# Patient Record
Sex: Male | Born: 1955 | Race: Black or African American | Hispanic: No | Smoking: Light tobacco smoker
Health system: Southern US, Community
[De-identification: ages and names within clinical notes are randomized; demographics above are authoritative.]

## PROBLEM LIST (undated history)

## (undated) DIAGNOSIS — I10 Essential (primary) hypertension: Secondary | ICD-10-CM

## (undated) DIAGNOSIS — IMO0002 Reserved for concepts with insufficient information to code with codable children: Secondary | ICD-10-CM

## (undated) DIAGNOSIS — I219 Acute myocardial infarction, unspecified: Secondary | ICD-10-CM

## (undated) DIAGNOSIS — E119 Type 2 diabetes mellitus without complications: Secondary | ICD-10-CM

## (undated) DIAGNOSIS — I739 Peripheral vascular disease, unspecified: Secondary | ICD-10-CM

## (undated) DIAGNOSIS — Z9289 Personal history of other medical treatment: Secondary | ICD-10-CM

## (undated) DIAGNOSIS — E785 Hyperlipidemia, unspecified: Secondary | ICD-10-CM

## (undated) DIAGNOSIS — I2510 Atherosclerotic heart disease of native coronary artery without angina pectoris: Secondary | ICD-10-CM

## (undated) DIAGNOSIS — D649 Anemia, unspecified: Secondary | ICD-10-CM

## (undated) DIAGNOSIS — T7840XA Allergy, unspecified, initial encounter: Secondary | ICD-10-CM

## (undated) DIAGNOSIS — Z8371 Family history of colonic polyps: Secondary | ICD-10-CM

## (undated) DIAGNOSIS — Z83719 Family history of colon polyps, unspecified: Secondary | ICD-10-CM

## (undated) DIAGNOSIS — F419 Anxiety disorder, unspecified: Secondary | ICD-10-CM

## (undated) DIAGNOSIS — R9430 Abnormal result of cardiovascular function study, unspecified: Secondary | ICD-10-CM

## (undated) DIAGNOSIS — Z720 Tobacco use: Secondary | ICD-10-CM

## (undated) HISTORY — DX: Essential (primary) hypertension: I10

## (undated) HISTORY — DX: Peripheral vascular disease, unspecified: I73.9

## (undated) HISTORY — DX: Acute myocardial infarction, unspecified: I21.9

## (undated) HISTORY — DX: Allergy, unspecified, initial encounter: T78.40XA

## (undated) HISTORY — DX: Abnormal result of cardiovascular function study, unspecified: R94.30

## (undated) HISTORY — DX: Tobacco use: Z72.0

## (undated) HISTORY — DX: Atherosclerotic heart disease of native coronary artery without angina pectoris: I25.10

## (undated) HISTORY — DX: Family history of colon polyps, unspecified: Z83.719

## (undated) HISTORY — DX: Hyperlipidemia, unspecified: E78.5

## (undated) HISTORY — DX: Anxiety disorder, unspecified: F41.9

## (undated) HISTORY — DX: Family history of colonic polyps: Z83.71

## (undated) HISTORY — DX: Reserved for concepts with insufficient information to code with codable children: IMO0002

## (undated) HISTORY — PX: CORONARY ANGIOPLASTY WITH STENT PLACEMENT: SHX49

---

## 1998-05-31 ENCOUNTER — Emergency Department (HOSPITAL_COMMUNITY): Admission: EM | Admit: 1998-05-31 | Discharge: 1998-05-31 | Payer: Self-pay | Admitting: Emergency Medicine

## 2006-02-24 ENCOUNTER — Inpatient Hospital Stay (HOSPITAL_COMMUNITY): Admission: AD | Admit: 2006-02-24 | Discharge: 2006-02-25 | Payer: Self-pay | Admitting: Cardiology

## 2006-02-24 ENCOUNTER — Ambulatory Visit: Payer: Self-pay | Admitting: Cardiology

## 2006-03-11 ENCOUNTER — Ambulatory Visit: Payer: Self-pay | Admitting: Cardiology

## 2006-05-17 ENCOUNTER — Ambulatory Visit: Payer: Self-pay | Admitting: Cardiology

## 2007-02-03 ENCOUNTER — Ambulatory Visit: Payer: Self-pay | Admitting: Cardiology

## 2007-05-11 ENCOUNTER — Ambulatory Visit: Payer: Self-pay | Admitting: Cardiology

## 2008-12-13 ENCOUNTER — Encounter: Payer: Self-pay | Admitting: Cardiology

## 2009-05-26 ENCOUNTER — Encounter: Payer: Self-pay | Admitting: Cardiology

## 2009-05-28 ENCOUNTER — Ambulatory Visit: Payer: Self-pay | Admitting: Cardiology

## 2009-05-28 ENCOUNTER — Encounter: Payer: Self-pay | Admitting: Internal Medicine

## 2010-05-12 ENCOUNTER — Encounter: Payer: Self-pay | Admitting: Cardiology

## 2010-07-29 ENCOUNTER — Encounter (INDEPENDENT_AMBULATORY_CARE_PROVIDER_SITE_OTHER): Payer: Self-pay | Admitting: *Deleted

## 2010-09-30 NOTE — Miscellaneous (Signed)
  Clinical Lists Changes  Observations: Added new observation of PAST MED HX: EF.. 40%....catheterization.... 2007   ( no echo data as of 05/12/2010) CAD...bare metal stent..LAD( 02/24/2006)...total RCA.. right to right collaterals.Marland Kitchen dyslipidemia * TOBACCO USE * HYPERTENSION CHEST DISCOMFORT Finances... very limited per patient   (05/12/2010 17:15) Added new observation of PRIMARY MD: None (05/12/2010 17:15)       Past History:  Past Medical History: EF.. 40%....catheterization.... 2007   ( no echo data as of 05/12/2010) CAD...bare metal stent..LAD( 02/24/2006)...total RCA.. right to right collaterals.Marland Kitchen dyslipidemia * TOBACCO USE * HYPERTENSION CHEST DISCOMFORT Finances... very limited per patient This

## 2010-09-30 NOTE — Letter (Signed)
Summary: Appointment - Missed  [facility], Main Office  [address]   [place], [place] [address]   Phone: [phone]  Fax: [phone]     July 29, 2010 MRN: [id number]   Austin Espinoza [address] [place], [place]  [address]   Dear [name],  Our records indicate you missed your appointment on  05/15/10  with Dr. Myrtis Ser . It is very important that we reach you to reschedule this appointment. We look forward to participating in your health care needs. Please contact us at the number listed above at your earliest convenience to reschedule this appointment.     Sincerely,  Christen Butter Scheduling Team

## 2011-01-05 ENCOUNTER — Other Ambulatory Visit: Payer: Self-pay | Admitting: Cardiology

## 2011-01-13 NOTE — Assessment & Plan Note (Signed)
[facility]                            CARDIOLOGY OFFICE NOTE   Espinoza, Austin                      MRN:          [id number]  DATE:02/03/2007                            DOB:          [birth year]    HISTORY OF PRESENT ILLNESS:  Austin Espinoza is a 55 year old gentleman who  presented with unstable angina in June of 2007.  I placed a bare metal  stent in his LAD.  He also has a total occlusion of the mid right  coronary which is collateralized from the proximal right.  My plan is to  manage that conservatively.  His subsequent course has been  uncomplicated.  He has not had any chest discomfort or exertional  dyspnea.  He does some yard work without symptoms.   Unfortunately, his medical compliance is questionable.  When I asked him  if he takes his medicines every day he says yes.  When I ask him about  his blood pressure he says he did not take it today.  When I pressed  further, it becomes clear that he has not taken it in several days.  I,  thus, do not have much confidence that he is taking them frequently at  all.  I reiterated to him the importance of compliance with these  medications for the reduction in his long term risk.   CURRENT MEDICATIONS:  1. Lovastatin 10 mg daily.  2. Atenolol 25 mg daily.  3. Enteric-coated aspirin 325 mg daily.  4. Enalapril 10 mg daily.   PHYSICAL EXAMINATION:  He is generally well appearing in no distress.  Heart rate is 64, blood pressure 162/88, weight of 185 pounds.  Weight  is down 11 pounds from September.  He has no jugular venous distension or thyromegaly.  Lungs are clear to auscultation.  Respiratory effort is normal.  He has a nondisplaced point of maximal cardiac impulse.  There is  regular rate and rhythm, normal S1 and S2.  There is an S4, but no  murmur or S3.  The abdomen is soft, nondistended, nontender.  There is no  hepatosplenomegaly and no abdominal bruit or pulsatile abdominal  mass.  Carotid pulses are 2+ bilaterally without bruit.   Electrocardiogram today demonstrates normal sinus rhythm at 64 beats per  minute with left ventricular hypertrophy without significant  repolarization abnormality.  No significant change compared with prior.   IMPRESSION/RECOMMENDATIONS:  1. Coronary disease, ejection fraction 35%-40%.  Continue medical      therapy.  I reiterated the critical importance of compliance.  2. Left ventricular systolic dysfunction.  Continue angiotensin-      converting enzyme inhibitor and beta blocker.  3. Hypercholesterolemia.  Continue lovastatin.  Since he has not been      compliant with it, there is no point in checking lipids or liver      function tests today.  We will bring him back in 3 months for      recheck.  4. Tobacco abuse.  Continues to smoke.  I strongly advised him to quit      entirely.  5. Hypertension.  Blood pressure elevated.  I cannot get much of a      read on his blood pressure because he has not taken his medicines      in several days.  I have asked him to resume, and we will have him      back in 3 months' time.     Salvadore Farber, MD  Electronically Signed    WED/MedQ  DD: 02/03/2007  DT: 02/03/2007  Job #: [id number]

## 2011-01-13 NOTE — Assessment & Plan Note (Signed)
[facility]                            CARDIOLOGY OFFICE NOTE   Austin Espinoza, Austin Espinoza                      MRN:          [id number]  DATE:05/11/2007                            DOB:          1956-04-11    Austin Espinoza has known coronary disease.  He is here for followup.  He  had been followed by Dr. Samule Ohm of our group previously.  Dr. Samule Ohm has  moved to [facility], and I will be taking over Austin Espinoza cardiology  care.  Patient has known coronary disease.  He presented with unstable  angina in June, 2007.  He received a bare metal stent to his LAD.  He  had total occlusion of the right that was collateralized from the  proximal right.  He has been managed conservatively.  He has done well.  He is not having chest pain.  He is not having any significant chest  pain.  There has been no syncope or presyncope.   PAST MEDICAL HISTORY:  Other medical problems, see the list below.   ALLERGIES:  There are no documented drug allergies.   MEDICATIONS:  1. Lovastatin 10.  2. Ranitidine on a p.r.n. basis.  3. Aspirin 325.  4. Enalapril 10 mg daily.   REVIEW OF SYSTEMS:  The patient mentioned that he has a slight twitching  sensation in various parts of his body at different times.  This does  not seem to be a major issue. Otherwise, his review of systems is  negative.   PHYSICAL EXAMINATION:  Weight is 187 pounds.  Blood pressure 140/88 with  a pulse of 62.  Patient is oriented to person, time, and place, and he is here with a  family member.  LUNGS:  Clear.  Respiratory effort is not labored.  CARDIAC:  An S1 with an S2.  There are no clicks or significant murmurs.  He has no xanthelasma. There is normal extraocular motion.  There are no carotid bruits.  There is no jugular venous distention.  ABDOMEN:  Soft.  He has normal bowel sounds.  He has no masses or  bruits.  EXTREMITIES:  He has no peripheral edema.   No labs are done today.   PROBLEMS:  1. Coronary disease.  He has a bare metal stent in his left anterior      descending artery.  He has a total right coronary with right-to-      right collateral.  2. History of ejection fraction in the 40% range.  We will keep him on      beta blocker and an ACE inhibitor.  His finances are quite limited,      and his compliance has been a difficult issue.  We will not push      for further aggressive evaluation, as he is getting the appropriate      medications, and he is stable.  3. Hypercholesterolemia:  We will see if we can change him to      inexpensive simvastatin to get more effect.  4. Tobacco use:  The patient continues to smoke.  I have talked  with      him about this and have urged him to try to stop.  He acknowledges      that he needs to, but he never does.  5. Hypertension:  His blood pressure is controlled.   Cardiac status is stable.  Follow up in one year.     Austin Abed, MD, [facility]  Electronically Signed    JDK/MedQ  DD: 05/11/2007  DT: 05/12/2007  Job #: [id number]

## 2011-01-16 NOTE — H&P (Signed)
NAME:  Austin Espinoza, ATTEBERRY NO.:  [id number]   MEDICAL RECORD NO.:  [id number]          PATIENT TYPE:  INP   LOCATION:  2905                         FACILITY:  [facility]   PHYSICIAN:  Amo Bing, M.D. [facility] OF BIRTH:  [birth year]   DATE OF ADMISSION:  02/24/2006  DATE OF DISCHARGE:                                HISTORY & PHYSICAL   PRIMARY CARE PHYSICIAN:  Dr. Steva Ready in the past.   HISTORY OF PRESENT ILLNESS:  This is a 55 year old gentleman with an  accelerating pattern of resting burning mid anterior chest discomfort  radiating to the left arm.  [name] noted the onset of these symptoms  approximately 2-3 weeks ago occurring at a rate of once per day.  There is  no relationship to the time of day, meals or exertion.  There was no  associated dyspnea, diaphoresis, nor nausea.  A few days prior to admission,  he had a brief episode with moderate exertion.  On the evening of admission,  symptoms recurred and were more persistent than they had previously, lasting  for 20 minutes.  He was seen in the emergency department at [facility] and advised to transfer to [facility].  After initial  treatment with intravenous heparin and nitroglycerin, no further symptoms  have occurred.   PAST MEDICAL HISTORY:  Unremarkable.  [name] has not been seen by a  physician for the past 10 years.  He is unaware of any cardiovascular risk  factors.  He takes no medicines routinely.  His only prior medical  intervention has been a left carpal tunnel release.   SOCIAL HISTORY:  He lives in [place] with his brother; divorced; no  children; works as a Arts administrator; 10 pack year history of cigarette  smoking that continues 1/2 pack per day.  No excessive use of alcohol.   FAMILY HISTORY:  Mother died at age 40 due to diabetes; father died in his  63's with hypertension and heart problems.  He has 15 siblings, only 1 of  whom has had coronary disease.   REVIEW  OF SYSTEMS:  Review of systems is notable for recent mild dyspnea on  exertion.  All other systems reviewed and are negative.   PHYSICAL EXAMINATION:  GENERAL:  He is pleasant, well-appearing  proportionate gentleman.  VITAL SIGNS:  The heart rate is 65, respirations 15, blood pressure 150/95,  O2 saturation 100% on 2 liters.  Afebrile.  HEENT:  Anicteric sclerae; EOMs full; normal oral mucosa.  NECK:  No jugulovenous distension; normal carotid upstrokes without bruits.  ENDOCRINE:  No thyromegaly.  HEMATOPOIETIC:  No adenopathy.  SKIN:  No significant lesions.  LUNGS:  Mild expiratory rhonchi; moderate prolongation of the expiratory  phase.  CARDIAC:  Normal first and second heart sounds; fourth heart sound present.  ABDOMEN:  Firm; nontender; no organomegaly; normal bowel sounds.  EXTREMITIES:  No edema; normal distal pulses.  MUSCULOSKELETAL: No joint deformities.  NEUROMUSCULAR:  Symmetric strength and tone; normal cranial nerves.   RADIOGRAPHIC DATA:  CHEST X-RAY:  Reportedly NAD.   EKG:  Normal sinus rhythm;  LVH by voltage; left atrial abnormality; marked  ST-T-wave abnormalities consistent with anterolateral ischemia or MI.   LABORATORY DATA:  There are laboratories notable for a normal CBC, normal  chemistry profile, borderline glucose  of132, BNP of 165, borderline initial  cardiac markers.   IMPRESSION:  [name] presents with symptoms consistent with unstable  angina and marked EKG abnormalities.  The likelihood is that he is  experiencing ischemia in the distribution of the left anterior descending  coronary artery.  Heparin, aspirin and intravenous nitroglycerin will be  continued.  Metoprolol will be added at low-dose.  Antiplatelet therapy will  be added per protocol.  We will proceed with coronary angiography.  The  risks and benefits have been explained to [name] who agrees to proceed.   There is EKG evidence for left ventricular hypertrophy with  marginally  elevated blood pressure.  It is likely the patient has hypertension.  This  will be managed as appropriate.   He has borderline hyperglycemia.  A lipid profile will be obtained.  Presence of  metabolic syndrome is likely and will be treated appropriately.   He has been advised to discontinue use of tobacco  products.  We will assist  him with this as necessary.      [place] Bing, M.D. [facility]  Electronically Signed     RR/MEDQ  D:  02/24/2006  T:  02/24/2006  Job:  [id number]

## 2011-01-16 NOTE — Cardiovascular Report (Signed)
NAME:  Austin Espinoza, Austin Espinoza NO.:  [id number]   MEDICAL RECORD NO.:  [id number]          PATIENT TYPE:  INP   LOCATION:  2905                         FACILITY:  [facility]   PHYSICIAN:  Charlton Haws, M.D.     DATE OF BIRTH:  11-Apr-1956   DATE OF PROCEDURE:  DATE OF DISCHARGE:                              CARDIAC CATHETERIZATION   Coronary arteriography.   INDICATIONS:  New onset angina.   Cine catheterization done from the right femoral artery.  The patient was on  IV nitro and heparin prior to the start of the case.  The nitro was titrated  due to his blood pressure.   1.  The left main coronary artery had 20% distal lesion.  2.  Left anterior descending artery had an 80% stenosis in the mid vessel      just after the takeoff of the first diagonal branch.  The first diagonal      branch had a 70% tubular lesion.  The distal LAD was normal.  3.  Circumflex coronary artery was codominant.  4.  The first obtuse marginal branch had a 50% tubular lesion.  5.  The right coronary artery had a 90% eccentric lesion proximally.  It was      100% occluded after the first RV branch.  There were some left-to-right      collaterals to the codominant PDA through septal perforators.   VENTRICULOGRAPHY:  Ventriculography showed global hypokinesis, worse at the  apex.  EF was only 35-40%.   Aortic pressure was 180/98.  LV pressure was 171/13.   IMPRESSION:  The films were reviewed with Dr. Samule Ohm.  I suspect he will  proceed with stenting of the native LAD and treat the remainder of the  vessels medically.   The patient has not seen a doctor in 10 years.  He needs better control his  blood pressure.   We will go over his meds, but I suspect he will be on both Coreg and an ACE  inhibitor.           ______________________________  Charlton Haws, M.D.     PN/MEDQ  D:  02/24/2006  T:  02/24/2006  Job:  [id number]   cc:   [place] Bing, M.D. [facility]  [address]  [address]  [place]  [place] [address]

## 2011-01-16 NOTE — Discharge Summary (Signed)
NAME:  Austin Espinoza, Austin Espinoza NO.:  [id number]   MEDICAL RECORD NO.:  [id number]          PATIENT TYPE:  INP   LOCATION:  2905                         FACILITY:  [facility]   PHYSICIAN:  Salvadore Farber, M.D. LHCDATE OF BIRTH:  08-05-1956   DATE OF ADMISSION:  02/24/2006  DATE OF DISCHARGE:                                 DISCHARGE SUMMARY   PROCEDURES:  1.  Cardiac catheterization.  2.  Coronary arteriogram.  3.  Left ventriculogram.  4.  Percutaneous transluminal coronary angiography with a bare metal stent      to one vessel.   TIME AT DISCHARGE:  37 minutes.   PRIMARY DIAGNOSIS:  Acute coronary syndrome status post bare metal stent to  the left anterior descending.   SECONDARY DIAGNOSES:  1.  Residual coronary artery disease with the right coronary artery totaled      and left-to-right collaterals, first diagonal 70%, circumflex 50%,      medical therapy.  2.  Ischemic cardiomyopathy with an ejection fraction of 35-40%.  3.  Hyperlipidemia with a total cholesterol 186, triglyceride 80, HDL 47,      LDL 123.  4.  Hypertension.  5.  Tobacco use.  6.  Family history of coronary artery disease in his father and one sibling.  7.  No known drug allergies.   HOSPITAL COURSE:  Mr. Gerads is a 55 year old male with no previous history  of coronary artery disease.  He has not seen a doctor in ten years.  He was  smoking.  He had no medical problems of which he was aware.  He was admitted  for further evaluation and treatment.   It was decided that cardiac catheterization was the best way to define his  anatomy.  The cardiac catheterization showed a 20% left main, 80% mid LAD,  and a first diagonal 70% stenosis.  The circumflex was co-dominant and had a  50% stenosis in the OM.  The RCA had a 90% proximal stenosis and was totaled  in the mid portion with left-to-right collaterals to the codominant PDA.  He  had global hypokinesis on his left ventriculogram that was  worse at the apex  with an EF of 35-40%.  Dr. Samule Ohm and Dr. Eden Emms reviewed the films and felt  that percutaneous intervention was indicated.   Mr. Eliot had PTCA and a bare metal stent reducing the LAD stenosis from  95 to 0.  He is to be on Plavix for one year and aspirin indefinitely.  Medical therapy for his other coronary artery disease is recommended.   Dictation ends here.      Theodore Demark, P.A. [facility]      Salvadore Farber, M.D. [facility]  Electronically Signed    RB/MEDQ  D:  02/25/2006  T:  02/25/2006  Job:  [id number]

## 2011-01-16 NOTE — Assessment & Plan Note (Signed)
[facility] HEALTHC[facility]                              CARDIOLOGY OFFICE NOTE   AEDON, DEASON                      MRN:          [id number]  DATE:05/17/2006                            DOB:          May 28, 1956    HISTORY OF PRESENT ILLNESS:  [name] is a 55 year old gentleman who  presented with unstable angina in June.  I placed a bare metal stent in his  mid LAD.  He also has a total occlusion at the mid right coronary, which is  collateralized from the right.  We are managing that conservatively.  Post  procedural course has been uncomplicated.  We filled out his indigent papers  for the Plavix program, but he says he has never managed to return the  papers to the company.  Receiving other medicines from [facility].   CURRENT MEDICATIONS:  1. Lovastatin 10 mg per day.  2. Atenolol 25 mg per day.  3. Enalapril 5 mg per day.  4. Enteric-coated aspirin 325 mg per day.   PHYSICAL EXAMINATION:  GENERAL:  He is generally well-appearing in no  distress.  VITAL SIGNS:  Heart rate of 57, blood pressure 138/82, and weight of 196  pounds.  NECK:  He has no jugular venous distension or thyromegaly.  LUNGS:  Clear to auscultation.  CARDIAC:  He has a nondisplaced point of maximum cardiac impulse.  There is  a regular rate and rhythm with normal S1 and S2.  There is an S4.  There is  no murmur.  ABDOMEN:  Soft, nondistended, and nontender.  There is no hepatosplenomegaly  and no abdominal bruit or pulsatile abdominal mass.  Femoral pulses 2 plus  bilaterally without bruit.  PT pulses 2+ bilaterally.   Electrocardiogram demonstrates sinus bradycardia at 57 beats per minute with  LVH and repolarization abnormality.  No change when compared with prior.   IMPRESSION/RECOMMENDATIONS:  1. Coronary artery disease:  Ejection fraction 35-40%.  Continue medical      therapy, including aspirin, beta-blocker, ACE inhibitor.  Will stop the      Plavix as he is through  the highest risk period without being on it.      Bare metal stent in the LAD.  2. Left ventricular systolic dysfunction:  Continue aspirin, Plavix, and      beta-blocker.  3. Hypercholesterolemia:  Continue lovastatin.  Check lipids and LFTs in 4      weeks.  4. Tobacco abuse:  Continues to smoke.  Says he plans to quit.  5. Hypertension:  Blood pressure higher than I would like.  Will double      enalapril to 10 mg a day.                                 Salvadore Farber, MD    WED/MedQ  DD:  05/17/2006  DT:  05/17/2006  Job #:  [id number]

## 2011-01-16 NOTE — Assessment & Plan Note (Signed)
[facility] HEALTHCARE                              CARDIOLOGY OFFICE NOTE   Austin Espinoza, Austin Espinoza                      MRN:          [id number]  DATE:03/11/2006                            DOB:          [birth year]    CARDIOLOGY NOTE:  This is a 55 year old African-American male patient who is  here being seen post hospital.  He presented with a two to three week  history of chest pain.  Cardiac enzymes were initially negative and he  underwent PTCA and bare metal stent of the mid LAD with residual 5% distal  left main, 50% OM1, 90% eccentric lesion, proximal lesion of the RCA and  total occlusion of the RCA after the first RV branch.  There was some left  to right collaterals to the codominant PDA through the septal perforators.  He had global hypokinesis, worse at the apex, ejection fraction 35 to 40%.  His troponins were slightly elevated post procedure at 0.24 but CK's, MB's  were negative.   Since the patient has been home he denies any chest pain, palpitations or  shortness of breath.  He is having trouble affording the Plavix.  He was  referred to the indigent program.  They have claimed that they sent him a  form to complete about his income and he says he never got it.  He took his  last pill yesterday and his sister said he cannot afford this.  His other  drugs are supplied at [facility] for four dollars.  He is also asking if he  can go back to work.   CURRENT MEDICATIONS:  1.  Lovastatin 10 mg daily.  2.  Atenolol 25 mg daily.  3.  Enalapril 5 mg daily.  4.  Ranitidine p.r.n.  5.  Coated aspirin 325 mg daily.  6.  Plavix 75 mg daily.   PHYSICAL EXAMINATION:  This is a pleasant 55 year old African-American male  in no acute distress.  VITAL SIGNS:  Blood pressure 146/80.  Pulse 52.  Weight 186.  NECK:  Without JVD, HJR, bruit or thyroid enlargement.  LUNGS:  Clear anterior, posterior and lateral.  HEART:  Regular rate and rhythm at 52 beats per  minute.  Normal S1 and S2.  Positive S4.  No murmur heard.  ABDOMEN:  Soft with no organomegaly, no masses, lesions or abnormal  tenderness.  Right groin is without hematoma or hemorrhage. Good distal  pulses.  EKG:  Sinus bradycardia with T wave inversion V5 through V6.  No acute  changes. No old EKGs to compare.  He has inferior Q waves and LVH .   IMPRESSION:  1.  Status post bare metal stent to the left anterior descending, on February 24, 2006 with residual 90% and total right coronary artery  global      hyperkinesis ejection fraction 35 to 40%.  2.  Hypertension.  3.  Hypolipidemia.  4.  Family history of coronary artery disease.  5.  Tobacco abuse.  Says he quit.  6.  History of left carpal tunnel surgery.   PLAN:  Plan at this time.  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  We are trying to work out the Plavix  indigent program so he does receive his Plavix as Dr. Samule Ohm wants him to  stay on this for a year.  He will see Dr. Samule Ohm back in followup in one  month.                                  Jacolyn Reedy, PA-C    ML/MedQ  DD:  03/11/2006  DT:  03/11/2006  Job #:  [id number]

## 2011-01-16 NOTE — Cardiovascular Report (Signed)
NAME:  NAT, LOWENTHAL NO.:  [id number]   MEDICAL RECORD NO.:  [id number]          PATIENT TYPE:  INP   LOCATION:  2905                         FACILITY:  [facility]   PHYSICIAN:  Salvadore Farber, M.D. LHCDATE OF BIRTH:  02-Aug-1956   DATE OF PROCEDURE:  02/24/2006  DATE OF DISCHARGE:                              CARDIAC CATHETERIZATION   PROCEDURE:  Placement of bare-metal stent in the mid-left anterior  descending artery, intravascular ultrasound of the LAD.   INDICATIONS:  Mr. Fieldhouse is a 55 year old gentleman who has received  minimal medical care in the past.  He presents with non-ST elevation  myocardial infarction.  He underwent diagnostic angiography by Dr. Eden Emms  earlier today.  That demonstrated chronic total occlusion of the RCA which  supplies a fairly small territory as it is codominant.  The culprit lesion  was a 95% stenosis of the mid-LAD.  There is also a 90% stenosis in the  midportion of a moderate-sized diagonal.  I was asked to consider  percutaneous revascularization.  After discussion with the patient, we  decided to proceed with stenting of the LAD and conservative management of  the diagonal and RCA.   PROCEDURAL TECHNIQUE:  Informed consent was obtained.  Under 1% lidocaine  local anesthesia, the preexisting right common femoral arterial sheath was  upsized over a wire to 6-French.  Heparin and double bolus eptifibatide were  administered as was CHAMPION study drug.  ACT was maintained at greater than  200 seconds.  A 6-French CLS 3.5 guide was advanced over wire and engaged in  the ostial of the left main.  A Prowater wire was advanced to the distal  vessel without difficulty.  The lesion was predilated using a 3.0 x 9 mm  Maverick at 6 atmospheres.  I then performed intravascular ultrasound to  assess both the length of the lesion and the diameter of the vessel.  This  demonstrated the vessel to measure 4.1 x 3.2 mm and the thrombus to  extend  within 2 mm of the diagonal.  We then proceeded to stenting using a 3.5 x 12  mm Liberte.  I positioned the stent to begin just distal to the origin of  the diagonal.  I deployed it at 16 atmospheres.  We then postdilated using a  3.75 x 12 mm Quantum at 16 atmospheres.  Repeat intravascular ultrasound  demonstrated complete apposition and very good expansion of the stent.  As  intended, the stent begins just after the origin of the diagonal.   Final angiography demonstrated no residual stenosis, no dissection and TIMI  III flow to the distal vasculature.  The patient was then transferred to the  holding room in stable condition having tolerated the procedure well.   COMPLICATIONS:  None.   IMPRESSION/PLAN:  Successful bare-metal stent placed in the mid-left  anterior descending artery .  Due to his acute coronary syndrome, he should  be maintained on Plavix for a year.  Aspirin should be continued  indefinitely.  Statin was added and will be continued.      Salvadore Farber, M.D. [facility]  Electronically Signed     WED/MEDQ  D:  02/24/2006  T:  02/24/2006  Job:  [id number]

## 2011-01-16 NOTE — Discharge Summary (Signed)
NAME:  Austin Espinoza, Austin Espinoza NO.:  [id number]   MEDICAL RECORD NO.:  [id number]          PATIENT TYPE:  INP   LOCATION:  2905                         FACILITY:  [facility]   PHYSICIAN:  Salvadore Farber, M.D. LHCDATE OF BIRTH:  17-Dec-1955   DATE OF ADMISSION:  02/24/2006  DATE OF DISCHARGE:  02/25/2006                                 DISCHARGE SUMMARY   TIME OF DISCHARGE:  37 minutes.   PROCEDURES:  1.  Cardiac catheterization.  2.  Coronary arteriogram.  3.  Left ventriculogram.  4.  Percutaneous transluminal coronary angiography and bare metal stent to      one vessel.   PRIMARY DIAGNOSIS:  Chest pain, acute coronary syndrome.   SECONDARY DIAGNOSIS:  1.  Hyperlipidemia.  2.  Hypertension.  3.  Family history of coronary artery disease in his father.  4.  History of left carpal tunnel surgery.  5.  No known drug allergies.  6.  Ongoing tobacco use.Marland Kitchen   HOSPITAL COURSE:  [name] is a 55 year old male with no previous history  of coronary artery disease.  He had a 2-3-week history of chest pain and  came to the hospital where he was admitted for further evaluation and  treatment.   Cardiac enzymes were negative but a cardiac catheterization was performed to  further define his anatomy.  It showed an 80% LAD in the mid portion as well  as a 70% diagonal, 50% circumflex, 90% RCA that was totaled in the mid  portion with left-to-right collaterals,  and the left-to-right collaterals  filled the PDA and was codominant with the circumflex.  His EF was 35-40%.  [name] had PTCA and a bare metal stent reducing the stenosis to 0.  His  post procedure enzymes were minimally elevated with a troponin of 0.24 but  the CK MBs were all negative.  A lipid profile was performed which showed a  total cholesterol 186, triglycerides 80, HDL 47, LDL 123.  [name] had a  low dose beta blocker and ACE inhibitor added to his medication regimen for  ischemic cardiomyopathy.   He had a case manager consult to help him obtain  his medications including Plavix.  He is to follow up with the [facility]  in [place] for general medical care.  [name] was seen by cardiac rehab  and given information on a walking program as well as stent restrictions and  a low-fat diet.  On February 25, 2006, [name] was evaluated by Dr. Samule Espinoza  and considered stable for discharge with outpatient follow-up arranged.   DISCHARGE INSTRUCTIONS:  His activity level is to be increased gradually.  He is to stick to a low fat diet.  He has an appointment with Dr. Melinda Crutch  PA on July 12 at 9:15.   DISCHARGE MEDICATIONS:  1.  Lovastatin 10 mg daily.  2.  Atenolol 25 mg a day.  3.  Enalapril 5 mg daily.  4.  Nitroglycerin sublingual p.r.n.  5.  Ranitidine 3 mg a day.  6.  Coated aspirin 325 mg a day.  7.  Plavix 75  mg daily.   [name] medications were changed to prescriptions that could be filled  at [facility] for $4 with the exception of the Plavix.      Austin Espinoza, P.A. [facility]      Salvadore Farber, M.D. [facility]  Electronically Signed    RB/MEDQ  D:  02/25/2006  T:  02/25/2006  Job:  [id number]

## 2011-02-18 ENCOUNTER — Other Ambulatory Visit: Payer: Self-pay | Admitting: Cardiology

## 2011-12-20 ENCOUNTER — Encounter: Payer: Self-pay | Admitting: Cardiology

## 2011-12-20 DIAGNOSIS — I2510 Atherosclerotic heart disease of native coronary artery without angina pectoris: Secondary | ICD-10-CM | POA: Insufficient documentation

## 2011-12-20 DIAGNOSIS — R9430 Abnormal result of cardiovascular function study, unspecified: Secondary | ICD-10-CM | POA: Insufficient documentation

## 2011-12-20 DIAGNOSIS — Z720 Tobacco use: Secondary | ICD-10-CM | POA: Insufficient documentation

## 2011-12-20 DIAGNOSIS — E785 Hyperlipidemia, unspecified: Secondary | ICD-10-CM | POA: Insufficient documentation

## 2011-12-20 DIAGNOSIS — I10 Essential (primary) hypertension: Secondary | ICD-10-CM | POA: Insufficient documentation

## 2011-12-21 ENCOUNTER — Encounter: Payer: Self-pay | Admitting: *Deleted

## 2011-12-23 ENCOUNTER — Encounter: Payer: Self-pay | Admitting: Cardiology

## 2011-12-23 ENCOUNTER — Ambulatory Visit (INDEPENDENT_AMBULATORY_CARE_PROVIDER_SITE_OTHER): Payer: Self-pay | Admitting: Cardiology

## 2011-12-23 VITALS — BP 149/80 | HR 61 | Ht 68.0 in | Wt 187.0 lb

## 2011-12-23 DIAGNOSIS — Z720 Tobacco use: Secondary | ICD-10-CM

## 2011-12-23 DIAGNOSIS — F17200 Nicotine dependence, unspecified, uncomplicated: Secondary | ICD-10-CM

## 2011-12-23 DIAGNOSIS — I2510 Atherosclerotic heart disease of native coronary artery without angina pectoris: Secondary | ICD-10-CM

## 2011-12-23 DIAGNOSIS — I739 Peripheral vascular disease, unspecified: Secondary | ICD-10-CM

## 2011-12-23 NOTE — Progress Notes (Signed)
   HPI Patient is seen for followup coronary disease and to assess leg discomfort. He has known coronary disease. I saw him last in September, 2010. He received a bare-metal stent to the LAD in 2007. He had a total right with right to right collaterals. Ejection fraction in the past was in the 45% range. At this time he is not having any chest pain or shortness of breath. However he has discomfort in his legs with walking. He seems to have significant claudication.  As part of today's evaluation I have reviewed carefully the records from 2010. I have updated completely the new electronic medical record.  No Known Allergies  Current Outpatient Prescriptions  Medication Sig Dispense Refill  . aspirin 325 MG tablet Take 325 mg by mouth daily.      Marland Kitchen atenolol (TENORMIN) 25 MG tablet TAKE ONE TABLET BY MOUTH EVERY DAY  30 tablet  12  . enalapril (VASOTEC) 10 MG tablet TAKE ONE TABLET BY MOUTH EVERY DAY  30 tablet  12  . lovastatin (MEVACOR) 10 MG tablet TAKE ONE TABLET BY MOUTH EVERY DAY - NEEDS APPOINTMENT.  30 tablet  12    History   Social History  . Marital Status: Widowed    Spouse Name: N/A    Number of Children: 0  . Years of Education: N/A   Occupational History  . Not on file.   Social History Main Topics  . Smoking status: Current Everyday Smoker  . Smokeless tobacco: Not on file  . Alcohol Use: Not on file  . Drug Use: Not on file  . Sexually Active: Not on file   Other Topics Concern  . Not on file   Social History Narrative  . No narrative on file    Family History  Problem Relation Age of Onset  . Hypertension    . Heart disease    . Diabetes    . Coronary artery disease      Past Medical History  Diagnosis Date  . CAD (coronary artery disease)     Bare-metal stent, LAD, 2007, total RCA, right to right collaterals  . Ejection fraction < 50%     EF 40% in the past  . Dyslipidemia   . Tobacco abuse   . Hypertension     No past surgical history on  file.  ROS   Patient denies fever, chills, headache, sweats, rash, change in vision, change in hearing, chest pain, cough, nausea vomiting, urinary symptoms. All other systems are reviewed and are negative.  PHYSICAL EXAM  Patient is stable. He is oriented to person time and place. Affect is normal. There is no jugulovenous distention. There no carotid bruits. Lungs are clear. Respiratory effort is nonlabored. Cardiac exam reveals S1 and S2. There are no clicks or significant murmurs. The abdomen is soft. His distal pulses are absent by physical exam. There is no coolness of his feet.  Filed Vitals:   12/23/11 1617  BP: 149/80  Pulse: 61  Height: 5\' 8"  (1.727 m)  Weight: 187 lb (84.823 kg)   EKG is done and reviewed by me. There is increased voltage. The tracing is compatible with left internal hypertrophy with voltage and ST changes. There is no significant change from the past.  ASSESSMENT & PLAN

## 2011-12-23 NOTE — Assessment & Plan Note (Signed)
The patient is having symptoms with walking. He may well have significant bilateral claudication. He has decreased distal pulses. He does not have any rest pain. We'll arrange for arterial Dopplers of the legs and then see him in followup. We'll be in touch with him about the information. If he has marked abnormalities, I will refer him for vascular evaluation before seeing him back.

## 2011-12-23 NOTE — Patient Instructions (Signed)
Your physician recommends that you schedule a follow-up appointment in: 6 weeks  Your physician has requested that you have a lower or upper extremity arterial duplex. This test is an ultrasound of the arteries in the legs or arms. It looks at arterial blood flow in the legs and arms. Allow one hour for Lower and Upper Arterial scans. There are no restrictions or special instructions

## 2011-12-23 NOTE — Assessment & Plan Note (Signed)
Coronary disease is stable. He has not been assessed since 2007 area however he's not having symptoms. I will delay workup of his cardiac status until after the workup of his claudication.

## 2011-12-23 NOTE — Assessment & Plan Note (Signed)
The patient continues to smoke. He is counseled to stop.

## 2011-12-29 ENCOUNTER — Encounter (INDEPENDENT_AMBULATORY_CARE_PROVIDER_SITE_OTHER): Payer: Self-pay

## 2011-12-29 ENCOUNTER — Encounter: Payer: Self-pay | Admitting: Cardiology

## 2011-12-29 DIAGNOSIS — I739 Peripheral vascular disease, unspecified: Secondary | ICD-10-CM

## 2011-12-29 DIAGNOSIS — I70219 Atherosclerosis of native arteries of extremities with intermittent claudication, unspecified extremity: Secondary | ICD-10-CM

## 2012-01-01 ENCOUNTER — Encounter: Payer: Self-pay | Admitting: Cardiology

## 2012-01-01 ENCOUNTER — Telehealth: Payer: Self-pay

## 2012-01-01 DIAGNOSIS — I739 Peripheral vascular disease, unspecified: Secondary | ICD-10-CM

## 2012-01-01 NOTE — Telephone Encounter (Signed)
Message from Dr Myrtis Ser is as follows: "Please be in touch with Austin Espinoza and let him know that the Dopplers or abnormal. This probably does explain the difficulty she is having with his legs. I want him to be seen for peripheral vascular consultation. Please arrange this".  Austin Espinoza was given this information and will be awaiting an appt.

## 2012-01-01 NOTE — Progress Notes (Signed)
   After seeing the patient and the office recently, arterial leg Dopplers were done. The patient has significant disease. I will arrange for a PV consult.

## 2012-01-19 ENCOUNTER — Encounter: Payer: Self-pay | Admitting: Cardiovascular Disease

## 2012-01-19 ENCOUNTER — Ambulatory Visit (INDEPENDENT_AMBULATORY_CARE_PROVIDER_SITE_OTHER): Payer: Self-pay | Admitting: Cardiovascular Disease

## 2012-01-19 VITALS — BP 162/90 | HR 80 | Ht 68.0 in | Wt 189.0 lb

## 2012-01-19 DIAGNOSIS — I739 Peripheral vascular disease, unspecified: Secondary | ICD-10-CM

## 2012-01-19 DIAGNOSIS — I779 Disorder of arteries and arterioles, unspecified: Secondary | ICD-10-CM

## 2012-01-19 NOTE — Progress Notes (Signed)
   History of Present Illness: 56 yo male with history of CAD, HLD, tobacco abuse, HTN and recent c/o claudication who is here today as a new PV consult. Non-invasive studies 12/29/11 with ABI moderately reduced on the right and left (right ABI 0.58, left ABI 0.69). There is suggestion of inflow disease but also a focal stenosis in the left CFA and mid left SFA.   He tells me that his legs get "tired" with walking and soreness in the calf muscles with walking. He can only walk about 100 yards before he has the onset of leg pain. He has no rest pain or ulcers. He smokes 1/2 ppd. No chest pains or SOB. Works at [employer] in [employer].   Primary Care Physician:  None  Primary Cardiologist: Dr. Myrtis Ser  Past Medical History  Diagnosis Date  . CAD (coronary artery disease)     Bare-metal stent, LAD, 2007, total RCA, right to right collaterals  . Ejection fraction < 50%     EF 40% in the past  . Dyslipidemia   . Tobacco abuse   . Hypertension   . Claudication     April, 2013    Past Surgical History  Procedure Date  . None     Current Outpatient Prescriptions  Medication Sig Dispense Refill  . aspirin 325 MG tablet Take 325 mg by mouth daily.      Marland Kitchen atenolol (TENORMIN) 25 MG tablet TAKE ONE TABLET BY MOUTH EVERY DAY  30 tablet  12  . enalapril (VASOTEC) 10 MG tablet TAKE ONE TABLET BY MOUTH EVERY DAY  30 tablet  12  . lovastatin (MEVACOR) 10 MG tablet TAKE ONE TABLET BY MOUTH EVERY DAY - NEEDS APPOINTMENT.  30 tablet  12    No Known Allergies  History   Social History  . Marital Status: Widowed    Spouse Name: N/A    Number of Children: 0  . Years of Education: N/A   Occupational History  . Works at [employer]    Social History Main Topics  . Smoking status: Current Everyday Smoker -- 0.5 packs/day for 40 years    Types: Cigarettes  . Smokeless tobacco: Not on file  . Alcohol Use: No  . Drug Use: No  . Sexually Active: Not on file   Other Topics Concern  . Not on  file   Social History Narrative  . No narrative on file    Family History  Problem Relation Age of Onset  . Heart attack Father   . Diabetes Mother   . Cancer Brother     Review of Systems:  As stated in the HPI and otherwise negative.   BP 162/90  Pulse 80  Ht 5\' 8"  (1.727 m)  Wt 189 lb (85.73 kg)  BMI 28.74 kg/m2  Physical Examination: General: Well developed, well nourished, NAD HEENT: OP clear, mucus membranes moist SKIN: warm, dry. No rashes. Neuro: No focal deficits Musculoskeletal: Muscle strength 5/5 all ext Psychiatric: Mood and affect normal Neck: No JVD, no carotid bruits, no thyromegaly, no lymphadenopathy. Lungs:Clear bilaterally, no wheezes, rhonci, crackles Cardiovascular: Regular rate and rhythm. No murmurs, gallops or rubs. Abdomen:Soft. Bowel sounds present. Non-tender.  Extremities: No lower extremity edema. Pulses are trace  in the bilateral DP/PT.

## 2012-01-19 NOTE — Assessment & Plan Note (Signed)
He likely has disease in his iliac arteries with at least moderate disease in the left SFA. I have discussed arranging a distal aortogram with bilateral LE runoff  or CTA abdomen/pelvis with bilateral LE runoff to define his disease. I would favor the CTA first since he may have involvement of his aorta. Will not arrange at this time since he has no insurance. He works at [employer] and will check about his benefits today and let us know. His disease is stable at this time with no rest pain or ulcerations. I think it is safe to wait for him to have his insurance issues resolved so he is not left with a big medical bill. Continue ASA. No changes in therapy. Follow up will be arranged after further discussions with pt.

## 2012-01-19 NOTE — Patient Instructions (Signed)
Call us to let us know if you want to arrange test.  Call us if you have problems with your legs

## 2012-02-09 ENCOUNTER — Ambulatory Visit: Payer: Self-pay | Admitting: Cardiology

## 2012-02-10 ENCOUNTER — Institutional Professional Consult (permissible substitution): Payer: Self-pay | Admitting: Cardiovascular Disease

## 2012-02-10 ENCOUNTER — Ambulatory Visit: Payer: Self-pay | Admitting: Cardiology

## 2012-02-12 ENCOUNTER — Encounter: Payer: Self-pay | Admitting: Cardiology

## 2012-02-12 DIAGNOSIS — I739 Peripheral vascular disease, unspecified: Secondary | ICD-10-CM | POA: Insufficient documentation

## 2012-02-15 ENCOUNTER — Ambulatory Visit (INDEPENDENT_AMBULATORY_CARE_PROVIDER_SITE_OTHER): Payer: BC Managed Care – PPO | Admitting: Cardiology

## 2012-02-15 ENCOUNTER — Encounter: Payer: Self-pay | Admitting: Cardiology

## 2012-02-15 VITALS — BP 154/82 | HR 78 | Resp 18 | Ht 68.0 in | Wt 190.0 lb

## 2012-02-15 DIAGNOSIS — I739 Peripheral vascular disease, unspecified: Secondary | ICD-10-CM

## 2012-02-15 DIAGNOSIS — I2510 Atherosclerotic heart disease of native coronary artery without angina pectoris: Secondary | ICD-10-CM

## 2012-02-15 NOTE — Patient Instructions (Addendum)
Your physician recommends that you schedule a follow-up appointment in: 6 months  Dr Gibson Ramp nurse will be calling you to schedule a CTA.  If you do not hear from her, please call her, Dennie Bible), at [phone].

## 2012-02-15 NOTE — Progress Notes (Signed)
   HPI Patient is seen back for followup coronary disease and claudication. I saw him on December 23, 2011. His Dopplers revealed that he had significant peripheral disease and he was referred for vascular evaluation. He was seen by Dr. Clifton James on Jan 19, 2012. It was recommended that he have a CTA to assess him further. At that time the patient wanted to look further into his insurance status. It is my understanding that he now has the appropriate insurance through his employer.  The patient has had some increasing symptoms in his legs.   No Known Allergies  Current Outpatient Prescriptions  Medication Sig Dispense Refill  . aspirin 325 MG tablet Take 325 mg by mouth daily.      Marland Kitchen atenolol (TENORMIN) 25 MG tablet TAKE ONE TABLET BY MOUTH EVERY DAY  30 tablet  12  . enalapril (VASOTEC) 10 MG tablet TAKE ONE TABLET BY MOUTH EVERY DAY  30 tablet  12  . lovastatin (MEVACOR) 10 MG tablet TAKE ONE TABLET BY MOUTH EVERY DAY - NEEDS APPOINTMENT.  30 tablet  12    History   Social History  . Marital Status: Widowed    Spouse Name: N/A    Number of Children: 0  . Years of Education: N/A   Occupational History  . Works at [employer]    Social History Main Topics  . Smoking status: Current Everyday Smoker -- 0.5 packs/day for 40 years    Types: Cigarettes  . Smokeless tobacco: Not on file  . Alcohol Use: No  . Drug Use: No  . Sexually Active: Not on file   Other Topics Concern  . Not on file   Social History Narrative  . No narrative on file    Family History  Problem Relation Age of Onset  . Heart attack Father   . Diabetes Mother   . Cancer Brother     Past Medical History  Diagnosis Date  . CAD (coronary artery disease)     Bare-metal stent, LAD, 2007, total RCA, right to right collaterals  . Ejection fraction < 50%     EF 40% in the past  . Dyslipidemia   . Tobacco abuse   . Hypertension   . PAD (peripheral artery disease)     Arterial leg Doppler, December 29, 2011,  greater than 50% focal stenosis of the left mid S.FA, right ABI mild range left ABI moderate range  //   consultation by Dr. Clifton James, Jan 19, 2012 CTA recommended, patient considering and reviewing his finances.    Past Surgical History  Procedure Date  . None     ROS  Patient denies fever, chills, headache, sweats, rash, change in vision, change in hearing, chest pain, cough, nausea vomiting, urinary symptoms. All other systems are reviewed and are negative.  PHYSICAL EXAM Patient is oriented to person time and place. Affect is normal. There is no jugulovenous distention. Lungs are clear. Respiratory effort is nonlabored. Cardiac exam reveals S1 and S2. There are no clicks or significant murmurs. The abdomen is soft. Is no peripheral edema.  Filed Vitals:   02/15/12 0940  BP: 154/82  Pulse: 78  Resp: 18  Height: 5\' 8"  (1.727 m)  Weight: 190 lb (86.183 kg)     ASSESSMENT & PLAN

## 2012-02-15 NOTE — Assessment & Plan Note (Signed)
The patient has significant disease. I will try to help move forward his peripheral vascular workup. Please refer to Dr.McAlhany's consult note. I will be in touch with his nurse to see if we can arrange things for early followup soon. This would include the CTA.

## 2012-02-15 NOTE — Assessment & Plan Note (Signed)
Coronary disease is stable. No further workup at this time. He has not been evaluated fully since 2007. His main symptom currently is from his claudication.

## 2012-02-17 ENCOUNTER — Telehealth: Payer: Self-pay | Admitting: *Deleted

## 2012-02-17 DIAGNOSIS — I739 Peripheral vascular disease, unspecified: Secondary | ICD-10-CM

## 2012-02-17 DIAGNOSIS — I2510 Atherosclerotic heart disease of native coronary artery without angina pectoris: Secondary | ICD-10-CM

## 2012-02-17 NOTE — Telephone Encounter (Signed)
Per Dr. Clifton James pt needs CTA of his distal aorta with bilateral lower ext runoff.  I called pt to give him this information. Left message to call back.  Will need BMP prior to CTA

## 2012-02-18 NOTE — Telephone Encounter (Signed)
Spoke with pt. He agreed to CTA and is aware he will need BMET prior to CTA. I notify [facility] to call pt to schedule BMET and CTA.

## 2012-02-18 NOTE — Telephone Encounter (Signed)
Pt rtn call, pls call [phone]

## 2012-02-19 NOTE — Telephone Encounter (Signed)
Per CT dept pt does not need BMP prior to CT. Pt is aware.

## 2012-02-19 NOTE — Addendum Note (Signed)
Addended by: Dossie Arbour on: 02/19/2012 02:22 PM   Modules accepted: Orders

## 2012-02-24 ENCOUNTER — Other Ambulatory Visit: Payer: Self-pay | Admitting: Cardiology

## 2012-02-24 ENCOUNTER — Other Ambulatory Visit: Payer: BC Managed Care – PPO

## 2012-02-25 ENCOUNTER — Ambulatory Visit (INDEPENDENT_AMBULATORY_CARE_PROVIDER_SITE_OTHER)
Admission: RE | Admit: 2012-02-25 | Discharge: 2012-02-25 | Disposition: A | Discharge status: Home / Self Care | Payer: BC Managed Care – PPO | Source: Ambulatory Visit | Attending: Cardiovascular Disease | Admitting: Cardiovascular Disease

## 2012-02-25 DIAGNOSIS — I739 Peripheral vascular disease, unspecified: Secondary | ICD-10-CM

## 2012-02-25 DIAGNOSIS — I2510 Atherosclerotic heart disease of native coronary artery without angina pectoris: Secondary | ICD-10-CM

## 2012-02-25 MED ORDER — IOHEXOL 350 MG/ML SOLN
120.0000 mL | Freq: Once | INTRAVENOUS | Status: AC | PRN
  Administered 2012-02-25: 120 mL via INTRAVENOUS

## 2012-03-08 ENCOUNTER — Encounter: Payer: Self-pay | Admitting: *Deleted

## 2012-03-08 ENCOUNTER — Encounter: Payer: Self-pay | Admitting: Cardiovascular Disease

## 2012-03-08 ENCOUNTER — Ambulatory Visit (INDEPENDENT_AMBULATORY_CARE_PROVIDER_SITE_OTHER): Payer: BC Managed Care – PPO | Admitting: Cardiovascular Disease

## 2012-03-08 VITALS — BP 125/64 | HR 67 | Ht 69.0 in | Wt 185.0 lb

## 2012-03-08 DIAGNOSIS — I739 Peripheral vascular disease, unspecified: Secondary | ICD-10-CM

## 2012-03-08 NOTE — Assessment & Plan Note (Addendum)
He has severe PAD by CTA with reduced ABI. He is having severe claudication. Will arrange distal aortogram with possible PTA on 04/06/12 at [facility] in [facility] lab. Labs week before procedure. R/B reviewed.

## 2012-03-08 NOTE — Patient Instructions (Addendum)
Your physician recommends that you schedule a follow-up appointment in: 8 weeks.   Your physician has requested that you have a peripheral vascular angiogram. This exam is performed at the hospital. During this exam IV contrast is used to look at arterial blood flow. Please review the information sheet given for details. Scheduled for April 06, 2012  Your physician recommends that you return for lab work on March 31, 2012

## 2012-03-08 NOTE — Progress Notes (Signed)
 History of Present Illness: 56 yo male with history of CAD, HLD, tobacco abuse, HTN and PAD who is here today for PV follow up. His cardiac issues are followed by Dr. Katz.  I saw him May 2013 for c/o claudication.  Non-invasive studies 12/29/11 with ABI moderately reduced on the right and left (right ABI 0.58, left ABI 0.69). There was suggestion of inflow disease but also a focal stenosis in the left CFA and mid left SFA. He told me that his legs get "tired" with walking. He also described soreness in the calf muscles with walking. He can only walk about 100 yards before he has the onset of leg pain. He has no rest pain or ulcers. He smokes 1/2 ppd. No chest pains or SOB. 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 I arranged a CTA of the distal aorta with runoff through both lower extremities. This was performed on 02/29/12 and showed severe stenosis in the right external iliac artery with moderate disease in both SFA with poor visualization below both knees.   He is here today for follow up. He has no change in his clinical status. Both legs are sore and ache with walking. He has some numbness in his legs with walking.   Primary Care Physician: None  Primary Cardiologist: Dr. Katz   Past Medical History  Diagnosis Date  . CAD (coronary artery disease)     Bare-metal stent, LAD, 2007, total RCA, right to right collaterals  . Ejection fraction < 50%     EF 40% in the past  . Dyslipidemia   . Tobacco abuse   . Hypertension   . PAD (peripheral artery disease)     Arterial leg Doppler, December 29, 2011, greater than 50% focal stenosis of the left mid S.FA, right ABI mild range left ABI moderate range  //   consultation by Dr. Darsh Vandevoort, Jan 19, 2012. CTA with severe right CIA stenosis, bil SFA.     Past Surgical History  Procedure Date  . None     Current Outpatient Prescriptions  Medication Sig Dispense Refill  . aspirin 325 MG tablet Take 325 mg by mouth daily.      . atenolol (TENORMIN)  25 MG tablet TAKE ONE TABLET BY MOUTH EVERY DAY  90 tablet  4  . enalapril (VASOTEC) 10 MG tablet TAKE ONE TABLET BY MOUTH EVERY DAY  90 tablet  4  . lovastatin (MEVACOR) 10 MG tablet TAKE ONE TABLET BY MOUTH EVERY DAY - NEEDS APPOINTMENT.  30 tablet  12    No Known Allergies  History   Social History  . Marital Status: Widowed    Spouse Name: N/A    Number of Children: 0  . Years of Education: N/A   Occupational History  . Works at [employer]    Social History Main Topics  . Smoking status: Current Everyday Smoker -- 0.5 packs/day for 40 years    Types: Cigarettes  . Smokeless tobacco: Not on file  . Alcohol Use: No  . Drug Use: No  . Sexually Active: Not on file   Other Topics Concern  . Not on file   Social History Narrative  . No narrative on file    Family History  Problem Relation Age of Onset  . Heart attack Father   . Diabetes Mother   . Cancer Brother     Review of Systems:  As stated in the HPI and otherwise negative.   BP 125/64  Pulse   67  Ht 5' 9" (1.753 m)  Wt 185 lb (83.915 kg)  BMI 27.32 kg/m2  Physical Examination: General: Well developed, well nourished, NAD HEENT: OP clear, mucus membranes moist SKIN: warm, dry. No rashes. Neuro: No focal deficits Musculoskeletal: Muscle strength 5/5 all ext Psychiatric: Mood and affect normal Neck: No JVD, no carotid bruits, no thyromegaly, no lymphadenopathy. Lungs:Clear bilaterally, no wheezes, rhonci, crackles Cardiovascular: Regular rate and rhythm. No murmurs, gallops or rubs. Abdomen:Soft. Bowel sounds present. Non-tender.  Extremities: No lower extremity edema. Pulses are trace  in the bilateral DP/PT.  CT angiogram aorta and lower extremities 02/29/12:  Aorta: Diffuse moderate aortic atherosclerosis with hypodense plaque formation/wall thickening and calcification. No evidence of significant aortic aneurysm, dissection, occlusion, or significant narrowing. Celiac, SMA, main renal arteries,  accessory left inferior renal artery, and IMA all remain patent. No significant mesenteric or renal vascular occlusive process demonstrated.  Right Lower Extremity: Right common, internal, and external iliac arteries demonstrate moderate diffuse plaque formation with calcification. No significant bifurcation disease. Diffuse irregularity and luminal narrowing related to atherosclerosis of the left external iliac artery. Distal right external iliac artery is significantly narrowed, image 142 estimated at 80 - 90%. Below this, the right common femoral and profunda femoral arteries are patent. Right SFA demonstrates diffuse luminal narrowings and irregularities. Distally at the adductor canal, the right SFA is narrowed at least 50% with heavy calcification. The popliteal artery remains patent across the knee. There is venous contamination on the lower extremity views limiting assessment of the tibial and peroneal arteries which appear patent but very small in caliber.  Left Lower Extremity: Left common, internal and external iliac arteries are patent. No significant bifurcation disease. Narrowing of the left external iliac artery noted diffusely, estimated less than 50% and not as severe as the right. Right common femoral and profunda femoral arteries are patent. Right SFA is patent proximally. Mid to distal SFA demonstrates diffuse luminal narrowings and irregularities from plaque narrowing the lumen distally, at least 50%. No SFA occlusion. Popliteal artery remains patent across the knee. Again, there is venous contamination lower extremity. Trifurcation vessels are calcified and small in caliber and difficult to truly assess for stenosis.  Nonvascular findings: Minimal left lower lobe sub segmental atelectasis. Lung bases otherwise clear. Normal heart size. No pericardial or pleural effusion.     

## 2012-03-22 ENCOUNTER — Encounter (HOSPITAL_COMMUNITY): Payer: Self-pay | Admitting: Pharmacy Technician

## 2012-03-31 ENCOUNTER — Other Ambulatory Visit (INDEPENDENT_AMBULATORY_CARE_PROVIDER_SITE_OTHER): Payer: BC Managed Care – PPO

## 2012-03-31 DIAGNOSIS — I739 Peripheral vascular disease, unspecified: Secondary | ICD-10-CM

## 2012-03-31 LAB — CBC WITH DIFFERENTIAL/PLATELET
Basophils Absolute: 0.1 10*3/uL (ref 0.0–0.1)
Basophils Relative: 0.6 % (ref 0.0–3.0)
Eosinophils Absolute: 0.2 10*3/uL (ref 0.0–0.7)
Lymphocytes Relative: 35 % (ref 12.0–46.0)
MCHC: 33 g/dL (ref 30.0–36.0)
MCV: 88.4 fl (ref 78.0–100.0)
Monocytes Absolute: 0.6 10*3/uL (ref 0.1–1.0)
Neutrophils Relative %: 54.5 % (ref 43.0–77.0)
Platelets: 287 10*3/uL (ref 150.0–400.0)
RBC: 4.04 Mil/uL — ABNORMAL LOW (ref 4.22–5.81)
RDW: 17.4 % — ABNORMAL HIGH (ref 11.5–14.6)

## 2012-03-31 LAB — BASIC METABOLIC PANEL
BUN: 9 mg/dL (ref 6–23)
CO2: 25 mEq/L (ref 19–32)
Calcium: 9.6 mg/dL (ref 8.4–10.5)
Chloride: 106 mEq/L (ref 96–112)
Creatinine, Ser: 1 mg/dL (ref 0.4–1.5)

## 2012-03-31 LAB — PROTIME-INR
INR: 1.1 ratio — ABNORMAL HIGH (ref 0.8–1.0)
Prothrombin Time: 12.4 s (ref 10.2–12.4)

## 2012-04-06 ENCOUNTER — Encounter (HOSPITAL_COMMUNITY)
Admission: RE | Disposition: A | Discharge status: Home / Self Care | Payer: Self-pay | Source: Ambulatory Visit | Attending: Cardiovascular Disease

## 2012-04-06 ENCOUNTER — Ambulatory Visit (HOSPITAL_COMMUNITY)
Admission: RE | Admit: 2012-04-06 | Discharge: 2012-04-06 | Disposition: A | Discharge status: Home / Self Care | Payer: BC Managed Care – PPO | Source: Ambulatory Visit | Attending: Cardiovascular Disease | Admitting: Cardiovascular Disease

## 2012-04-06 DIAGNOSIS — I10 Essential (primary) hypertension: Secondary | ICD-10-CM | POA: Insufficient documentation

## 2012-04-06 DIAGNOSIS — I739 Peripheral vascular disease, unspecified: Secondary | ICD-10-CM

## 2012-04-06 DIAGNOSIS — F17200 Nicotine dependence, unspecified, uncomplicated: Secondary | ICD-10-CM | POA: Insufficient documentation

## 2012-04-06 DIAGNOSIS — E785 Hyperlipidemia, unspecified: Secondary | ICD-10-CM | POA: Insufficient documentation

## 2012-04-06 DIAGNOSIS — I70219 Atherosclerosis of native arteries of extremities with intermittent claudication, unspecified extremity: Secondary | ICD-10-CM

## 2012-04-06 DIAGNOSIS — I708 Atherosclerosis of other arteries: Secondary | ICD-10-CM | POA: Insufficient documentation

## 2012-04-06 DIAGNOSIS — I2510 Atherosclerotic heart disease of native coronary artery without angina pectoris: Secondary | ICD-10-CM | POA: Insufficient documentation

## 2012-04-06 HISTORY — PX: ABDOMINAL AORTAGRAM: SHX5454

## 2012-04-06 SURGERY — ABDOMINAL AORTAGRAM
Anesthesia: LOCAL

## 2012-04-06 MED ORDER — ASPIRIN 81 MG PO CHEW
324.0000 mg | CHEWABLE_TABLET | ORAL | Status: AC
  Administered 2012-04-06: 324 mg via ORAL

## 2012-04-06 MED ORDER — HEPARIN (PORCINE) IN NACL 2-0.9 UNIT/ML-% IJ SOLN
INTRAMUSCULAR | Status: AC
  Filled 2012-04-06: qty 1000

## 2012-04-06 MED ORDER — SODIUM CHLORIDE 0.9 % IJ SOLN: 3.0000 mL | Freq: Two times a day (BID) | INTRAMUSCULAR | Status: DC

## 2012-04-06 MED ORDER — LIDOCAINE HCL (PF) 1 % IJ SOLN
INTRAMUSCULAR | Status: AC
  Filled 2012-04-06: qty 30

## 2012-04-06 MED ORDER — SODIUM CHLORIDE 0.9 % IV SOLN
INTRAVENOUS | Status: DC
  Administered 2012-04-06: 1000 mL via INTRAVENOUS

## 2012-04-06 MED ORDER — FENTANYL CITRATE 0.05 MG/ML IJ SOLN
INTRAMUSCULAR | Status: AC
  Filled 2012-04-06: qty 2

## 2012-04-06 MED ORDER — SODIUM CHLORIDE 0.9 % IV SOLN: INTRAVENOUS | Status: AC

## 2012-04-06 MED ORDER — DIAZEPAM 5 MG PO TABS
ORAL_TABLET | ORAL | Status: AC
  Administered 2012-04-06: 5 mg via ORAL
  Filled 2012-04-06: qty 1

## 2012-04-06 MED ORDER — SODIUM CHLORIDE 0.9 % IJ SOLN: 3.0000 mL | INTRAMUSCULAR | Status: DC | PRN

## 2012-04-06 MED ORDER — ASPIRIN 81 MG PO CHEW
CHEWABLE_TABLET | ORAL | Status: AC
  Administered 2012-04-06: 324 mg via ORAL
  Filled 2012-04-06: qty 4

## 2012-04-06 MED ORDER — MIDAZOLAM HCL 2 MG/2ML IJ SOLN
INTRAMUSCULAR | Status: AC
  Filled 2012-04-06: qty 2

## 2012-04-06 MED ORDER — DIAZEPAM 5 MG PO TABS
5.0000 mg | ORAL_TABLET | ORAL | Status: AC
  Administered 2012-04-06: 5 mg via ORAL

## 2012-04-06 MED ORDER — SODIUM CHLORIDE 0.9 % IV SOLN: 250.0000 mL | INTRAVENOUS | Status: DC | PRN

## 2012-04-06 MED ORDER — ACETAMINOPHEN 325 MG PO TABS: 650.0000 mg | ORAL_TABLET | ORAL | Status: DC | PRN

## 2012-04-06 MED ORDER — ONDANSETRON HCL 4 MG/2ML IJ SOLN: 4.0000 mg | Freq: Four times a day (QID) | INTRAMUSCULAR | Status: DC | PRN

## 2012-04-06 NOTE — H&P (View-Only) (Signed)
History of Present Illness: 56 yo male with history of CAD, HLD, tobacco abuse, HTN and PAD who is here today for PV follow up. His cardiac issues are followed by Dr. Myrtis Ser.  I saw him May 2013 for c/o claudication.  Non-invasive studies 12/29/11 with ABI moderately reduced on the right and left (right ABI 0.58, left ABI 0.69). There was suggestion of inflow disease but also a focal stenosis in the left CFA and mid left SFA. He told me that his legs get "tired" with walking. He also described soreness in the calf muscles with walking. He can only walk about 100 yards before he has the onset of leg pain. He has no rest pain or ulcers. He smokes 1/2 ppd. No chest pains or SOB. Works at [employer] in [employer]. I arranged a CTA of the distal aorta with runoff through both lower extremities. This was performed on 02/29/12 and showed severe stenosis in the right external iliac artery with moderate disease in both SFA with poor visualization below both knees.   He is here today for follow up. He has no change in his clinical status. Both legs are sore and ache with walking. He has some numbness in his legs with walking.   Primary Care Physician: None  Primary Cardiologist: Dr. Myrtis Ser   Past Medical History  Diagnosis Date  . CAD (coronary artery disease)     Bare-metal stent, LAD, 2007, total RCA, right to right collaterals  . Ejection fraction < 50%     EF 40% in the past  . Dyslipidemia   . Tobacco abuse   . Hypertension   . PAD (peripheral artery disease)     Arterial leg Doppler, December 29, 2011, greater than 50% focal stenosis of the left mid S.FA, right ABI mild range left ABI moderate range  //   consultation by Dr. Clifton James, Jan 19, 2012. CTA with severe right CIA stenosis, bil SFA.     Past Surgical History  Procedure Date  . None     Current Outpatient Prescriptions  Medication Sig Dispense Refill  . aspirin 325 MG tablet Take 325 mg by mouth daily.      Marland Kitchen atenolol (TENORMIN)  25 MG tablet TAKE ONE TABLET BY MOUTH EVERY DAY  90 tablet  4  . enalapril (VASOTEC) 10 MG tablet TAKE ONE TABLET BY MOUTH EVERY DAY  90 tablet  4  . lovastatin (MEVACOR) 10 MG tablet TAKE ONE TABLET BY MOUTH EVERY DAY - NEEDS APPOINTMENT.  30 tablet  12    No Known Allergies  History   Social History  . Marital Status: Widowed    Spouse Name: N/A    Number of Children: 0  . Years of Education: N/A   Occupational History  . Works at [employer]    Social History Main Topics  . Smoking status: Current Everyday Smoker -- 0.5 packs/day for 40 years    Types: Cigarettes  . Smokeless tobacco: Not on file  . Alcohol Use: No  . Drug Use: No  . Sexually Active: Not on file   Other Topics Concern  . Not on file   Social History Narrative  . No narrative on file    Family History  Problem Relation Age of Onset  . Heart attack Father   . Diabetes Mother   . Cancer Brother     Review of Systems:  As stated in the HPI and otherwise negative.   BP 125/64  Pulse  67  Ht 5\' 9"  (1.753 m)  Wt 185 lb (83.915 kg)  BMI 27.32 kg/m2  Physical Examination: General: Well developed, well nourished, NAD HEENT: OP clear, mucus membranes moist SKIN: warm, dry. No rashes. Neuro: No focal deficits Musculoskeletal: Muscle strength 5/5 all ext Psychiatric: Mood and affect normal Neck: No JVD, no carotid bruits, no thyromegaly, no lymphadenopathy. Lungs:Clear bilaterally, no wheezes, rhonci, crackles Cardiovascular: Regular rate and rhythm. No murmurs, gallops or rubs. Abdomen:Soft. Bowel sounds present. Non-tender.  Extremities: No lower extremity edema. Pulses are trace  in the bilateral DP/PT.  CT angiogram aorta and lower extremities 02/29/12:  Aorta: Diffuse moderate aortic atherosclerosis with hypodense plaque formation/wall thickening and calcification. No evidence of significant aortic aneurysm, dissection, occlusion, or significant narrowing. Celiac, SMA, main renal arteries,  accessory left inferior renal artery, and IMA all remain patent. No significant mesenteric or renal vascular occlusive process demonstrated.  Right Lower Extremity: Right common, internal, and external iliac arteries demonstrate moderate diffuse plaque formation with calcification. No significant bifurcation disease. Diffuse irregularity and luminal narrowing related to atherosclerosis of the left external iliac artery. Distal right external iliac artery is significantly narrowed, image 142 estimated at 80 - 90%. Below this, the right common femoral and profunda femoral arteries are patent. Right SFA demonstrates diffuse luminal narrowings and irregularities. Distally at the adductor canal, the right SFA is narrowed at least 50% with heavy calcification. The popliteal artery remains patent across the knee. There is venous contamination on the lower extremity views limiting assessment of the tibial and peroneal arteries which appear patent but very small in caliber.  Left Lower Extremity: Left common, internal and external iliac arteries are patent. No significant bifurcation disease. Narrowing of the left external iliac artery noted diffusely, estimated less than 50% and not as severe as the right. Right common femoral and profunda femoral arteries are patent. Right SFA is patent proximally. Mid to distal SFA demonstrates diffuse luminal narrowings and irregularities from plaque narrowing the lumen distally, at least 50%. No SFA occlusion. Popliteal artery remains patent across the knee. Again, there is venous contamination lower extremity. Trifurcation vessels are calcified and small in caliber and difficult to truly assess for stenosis.  Nonvascular findings: Minimal left lower lobe sub segmental atelectasis. Lung bases otherwise clear. Normal heart size. No pericardial or pleural effusion.

## 2012-04-06 NOTE — CV Procedure (Addendum)
   Cardiac Catheterization Operative Report  Austin Espinoza [id number] 8/7/201311:21 AM Pcp Not In System  Procedure Performed:   1. Distal aortogram with bilateral lower extremity runoff 2. Access left femoral artery  Operator: Verne Carrow, MD  Indication:  Claudication. Non-invasive studies suggest severe disease in the right iliac artery and at least moderate disease in the bilateral SFA.                                   Procedure Details: The risks, benefits, complications, treatment options, and expected outcomes were discussed with the patient. The patient and/or family concurred with the proposed plan, giving informed consent. The patient was brought to the cath lab after IV hydration was begun and oral premedication was given. The patient was further sedated with Versed and Fentanyl. The left groin was prepped and draped in the usual manner. Using the modified Seldinger access technique, a 5 French sheath was placed in the left femoral artery. I then advanced a J-tipped wire into the distal aorta just above the takeoff of the renal arteries. A pigtail catheter was advanced over the wire into the distal aorta. A performed angiography of the distal aorta. The catheter was pulled back to the level of the aortic bifurcation and angiography of the iliac arteries was performed. Both legs could not be captured with one run so I used an angled catheter to cross over into the right iliac artery. A straight catheter was used to perform angiography with runoff of the right leg. I then performed runoff of the left leg through the femoral artery sheath. All catheters were removed.   There were no immediate complications. The patient was taken to the recovery area in stable condition.   Hemodynamic Findings: Central aortic pressure:153/72  Angiographic Findings:  Distal aorta has mild plaque disease. 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    Bilateral renal arteries are patent. The  left renal artery has mild plaque disease.   The right common iliac artery has mild plaque disease. The external iliac artery has diffuse 50% stenosis with a focal 60-70% stenosis in the distal segment. The common femoral artery has no obstructive disease. The right SFA has several segments in the proximal and mid segments with 30% stenosis. There is 3 vessel runoff to the right foot.   The left common iliac artery has diffuse 40% stenosis with a ruptured plaque in the proximal portion of the external iliac artery. The external iliac artery has diffuse 50-60% stenosis throughout the entire vessel extending into the common femoral artery. The SFA has a 60% stenosis in the mid segment. There is 3 vessel runoff to the left foot but the ATA has serial 99% lesions in multiple locations.    Impression: 1. Bilateral iliac artery disease 2. Moderate left SFA stenosis.   Recommendations: He has diffuse disease in both iliac arteries which are only moderate sized vessels. Any stenting would require long segments in these moderate sized vessels which would be high risk for restenosis. Will manage medically for now. He has no rest pain or limb ischemia. Will recheck ABI in 6 months. If his clinical situation changes, will need to consider revascularization.         Complications:  None. The patient tolerated the procedure well.

## 2012-04-06 NOTE — Interval H&P Note (Signed)
History and Physical Interval Note:  04/06/2012 10:34 AM  Austin Espinoza  has presented today for surgery, with the diagnosis of PVD  The various methods of treatment have been discussed with the patient and family. After consideration of risks, benefits and other options for treatment, the patient has consented to  Procedure(s) (LRB): ABDOMINAL AORTAGRAM (N/A) as a surgical intervention .  The patient's history has been reviewed, patient examined, no change in status, stable for surgery.  I have reviewed the patient's chart and labs.  Questions were answered to the patient's satisfaction.     Zain Bingman

## 2012-04-14 ENCOUNTER — Other Ambulatory Visit: Payer: Self-pay | Admitting: Cardiology

## 2012-04-15 ENCOUNTER — Other Ambulatory Visit: Payer: Self-pay | Admitting: Cardiology

## 2012-04-15 DIAGNOSIS — I739 Peripheral vascular disease, unspecified: Secondary | ICD-10-CM

## 2012-04-27 ENCOUNTER — Ambulatory Visit: Payer: BC Managed Care – PPO | Admitting: Cardiovascular Disease

## 2012-09-08 ENCOUNTER — Encounter: Payer: Self-pay | Admitting: Cardiology

## 2012-09-27 ENCOUNTER — Encounter: Payer: Self-pay | Admitting: Cardiology

## 2012-09-28 ENCOUNTER — Ambulatory Visit (INDEPENDENT_AMBULATORY_CARE_PROVIDER_SITE_OTHER): Payer: BC Managed Care – PPO | Admitting: Cardiology

## 2012-09-28 ENCOUNTER — Encounter: Payer: Self-pay | Admitting: Cardiology

## 2012-09-28 VITALS — BP 152/80 | HR 64 | Resp 12 | Wt 187.8 lb

## 2012-09-28 DIAGNOSIS — I739 Peripheral vascular disease, unspecified: Secondary | ICD-10-CM

## 2012-09-28 DIAGNOSIS — I10 Essential (primary) hypertension: Secondary | ICD-10-CM

## 2012-09-28 DIAGNOSIS — Z720 Tobacco use: Secondary | ICD-10-CM

## 2012-09-28 DIAGNOSIS — R0989 Other specified symptoms and signs involving the circulatory and respiratory systems: Secondary | ICD-10-CM

## 2012-09-28 DIAGNOSIS — R9430 Abnormal result of cardiovascular function study, unspecified: Secondary | ICD-10-CM

## 2012-09-28 DIAGNOSIS — F17200 Nicotine dependence, unspecified, uncomplicated: Secondary | ICD-10-CM

## 2012-09-28 DIAGNOSIS — I2510 Atherosclerotic heart disease of native coronary artery without angina pectoris: Secondary | ICD-10-CM

## 2012-09-28 DIAGNOSIS — E785 Hyperlipidemia, unspecified: Secondary | ICD-10-CM

## 2012-09-28 MED ORDER — LISINOPRIL 20 MG PO TABS: 20.0000 mg | ORAL_TABLET | Freq: Every day | ORAL | Status: DC

## 2012-09-28 MED ORDER — ASPIRIN 81 MG PO TABS: 81.0000 mg | ORAL_TABLET | Freq: Every day | ORAL | Status: DC

## 2012-09-28 NOTE — Assessment & Plan Note (Signed)
Patient is receiving a very small dose of a statin.

## 2012-09-28 NOTE — Assessment & Plan Note (Signed)
Blood pressure is mildly elevated today. His meds will be adjusted.

## 2012-09-28 NOTE — Assessment & Plan Note (Signed)
Patient continues to smoke. I have counseled him to stop.

## 2012-09-28 NOTE — Patient Instructions (Addendum)
Your physician has recommended you make the following change in your medication: DECREASE your aspirin to 81 mg daily.  INCREASE your Vasotec to 10 mg twice daily until you run out, then START Lisinopril 20 mg once daily.    Your physician recommends that you schedule a follow-up appointment in: 3 months.

## 2012-09-28 NOTE — Assessment & Plan Note (Signed)
Historically his ejection fraction is reduced. He is on medications for this but the doses are not adequate. Vasotec dose will be increased.

## 2012-09-28 NOTE — Assessment & Plan Note (Signed)
His claudication is stable. I'm trying to figure out what his medication is for his PAD.

## 2012-09-28 NOTE — Progress Notes (Signed)
HPI  The patient is seen in followup coronary disease. I saw him last June, 2013. His coronary status is stable. He has been evaluated further by Dr. Clifton James. He eventually had an aortogram. It was felt that his vascular disease with significant but that it should be treated medically. The patient tells me that he has been on the medication. However I have not figured out which one yet. We will help with this. He's not having any significant chest pain or shortness of breath.  He says that he is ambulating without significant pain in his legs.  No Known Allergies  Current Outpatient Prescriptions  Medication Sig Dispense Refill  . aspirin 325 MG tablet Take 325 mg by mouth daily.      Marland Kitchen atenolol (TENORMIN) 25 MG tablet TAKE ONE TABLET BY MOUTH EVERY DAY  90 tablet  4  . enalapril (VASOTEC) 10 MG tablet TAKE ONE TABLET BY MOUTH EVERY DAY  90 tablet  4  . lovastatin (MEVACOR) 10 MG tablet Take 1 tablet (10 mg total) by mouth at bedtime.  90 tablet  3    History   Social History  . Marital Status: Widowed    Spouse Name: N/A    Number of Children: 0  . Years of Education: N/A   Occupational History  . Works at [employer]    Social History Main Topics  . Smoking status: Current Every Day Smoker -- 0.5 packs/day for 40 years    Types: Cigarettes  . Smokeless tobacco: Not on file  . Alcohol Use: No  . Drug Use: No  . Sexually Active: Not on file   Other Topics Concern  . Not on file   Social History Narrative  . No narrative on file    Family History  Problem Relation Age of Onset  . Heart attack Father   . Diabetes Mother   . Cancer Brother     Past Medical History  Diagnosis Date  . CAD (coronary artery disease)     Bare-metal stent, LAD, 2007, total RCA, right to right collaterals  . Ejection fraction < 50%     EF 40% in the past  . Dyslipidemia   . Tobacco abuse   . Hypertension   . PAD (peripheral artery disease)     Arterial leg Doppler, December 29, 2011,  greater than 50% focal stenosis of the left mid S.FA, right ABI mild range left ABI moderate range  //   consultation by Dr. Clifton James, Jan 19, 2012. CTA with severe right CIA stenosis, bil SFA.     Past Surgical History  Procedure Date  . None     Patient Active Problem List  Diagnosis  . CAD (coronary artery disease)  . Ejection fraction < 50%  . Dyslipidemia  . Hypertension  . Tobacco abuse  . Claudication  . PAD (peripheral artery disease)    ROS   Patient denies fever, chills, headache, sweats, rash, change in vision, change in hearing, chest pain, cough, nausea vomiting, urinary symptoms. All other systems are reviewed and are negative other than the history of present illness.    PHYSICAL EXAM   Patient is oriented to person time and place. Affect is normal. He has poor dentition. There is no jugulovenous distention. Lungs are clear. Respiratory effort is nonlabored. Cardiac exam reveals S1 and S2. There no clicks or significant murmurs. The abdomen is soft. There is no peripheral edema.  Filed Vitals:   09/28/12 0944  BP: 152/80  Pulse: 64  Resp: 12  Weight: 187 lb 12 oz (85.163 kg)  SpO2: 98%     ASSESSMENT & PLAN

## 2012-09-28 NOTE — Assessment & Plan Note (Signed)
Coronary disease is stable. He does not need any further workup at this time.

## 2012-10-06 ENCOUNTER — Telehealth: Payer: Self-pay | Admitting: Cardiology

## 2012-10-06 ENCOUNTER — Other Ambulatory Visit: Payer: Self-pay

## 2012-10-06 DIAGNOSIS — I739 Peripheral vascular disease, unspecified: Secondary | ICD-10-CM

## 2012-10-06 NOTE — Telephone Encounter (Signed)
Notes reviewed. Pt saw Dr. Clifton James on Jan 19, 2012 and March 08, 2012. I do not see any meds started at those office visits.  Pt had distal aortogram with bilateral lower extremity run off on April 06, 2012 and no meds were started at that time. Pt missed follow up office visit with Dr. Clifton James on April 27, 2012. Will review with Dr. Clifton James regarding possible medication

## 2012-10-06 NOTE — Telephone Encounter (Signed)
Mr Austin Espinoza found a new pill (Lisinopril) at home and wanted to know if he should be taking it.  I told him that Dr Myrtis Ser had started the lisinopril at his last visit.   Mr Austin Espinoza also wanted to know if he should be taking a pill for his "legs".  Will forward to Dennie Bible, Dr Gibson Ramp nurse, and Dr Clifton James to find out if he needs to be taking medication for his claudication as is questioned in Dr Henrietta Hoover last ov note.

## 2012-10-06 NOTE — Telephone Encounter (Signed)
Pt has questions re his medication list and what he should be taking  [phone]

## 2012-10-06 NOTE — Telephone Encounter (Signed)
He does not need any meds for his legs but he does need to f/u for ABI. Thanks, chris

## 2012-10-06 NOTE — Telephone Encounter (Signed)
Spoke with pt and gave him information from Dr. Clifton James. Will ask schedulers to contact pt to schedule ABI's and follow up with Dr. Clifton James

## 2012-10-10 NOTE — Telephone Encounter (Signed)
ABI's and appt with Dr. Clifton James have been scheduled for November 04, 2012

## 2012-11-04 ENCOUNTER — Ambulatory Visit: Payer: BC Managed Care – PPO | Admitting: Cardiovascular Disease

## 2012-11-29 ENCOUNTER — Encounter (INDEPENDENT_AMBULATORY_CARE_PROVIDER_SITE_OTHER): Payer: BC Managed Care – PPO

## 2012-11-29 DIAGNOSIS — I70219 Atherosclerosis of native arteries of extremities with intermittent claudication, unspecified extremity: Secondary | ICD-10-CM

## 2012-11-29 DIAGNOSIS — I739 Peripheral vascular disease, unspecified: Secondary | ICD-10-CM

## 2012-12-15 ENCOUNTER — Telehealth: Payer: Self-pay | Admitting: Cardiovascular Disease

## 2012-12-15 NOTE — Telephone Encounter (Signed)
Pt was given results,

## 2012-12-15 NOTE — Telephone Encounter (Signed)
New problem   Pt want to know the result of his PV test he had done 11/29/12.Please call pt

## 2012-12-26 ENCOUNTER — Encounter: Payer: Self-pay | Admitting: Cardiology

## 2012-12-28 ENCOUNTER — Ambulatory Visit (INDEPENDENT_AMBULATORY_CARE_PROVIDER_SITE_OTHER): Payer: BC Managed Care – PPO | Admitting: Cardiology

## 2012-12-28 ENCOUNTER — Encounter: Payer: Self-pay | Admitting: Cardiology

## 2012-12-28 VITALS — BP 140/77 | HR 74 | Ht 68.0 in | Wt 186.4 lb

## 2012-12-28 DIAGNOSIS — L989 Disorder of the skin and subcutaneous tissue, unspecified: Secondary | ICD-10-CM

## 2012-12-28 DIAGNOSIS — I10 Essential (primary) hypertension: Secondary | ICD-10-CM

## 2012-12-28 DIAGNOSIS — I739 Peripheral vascular disease, unspecified: Secondary | ICD-10-CM

## 2012-12-28 DIAGNOSIS — I2510 Atherosclerotic heart disease of native coronary artery without angina pectoris: Secondary | ICD-10-CM

## 2012-12-28 HISTORY — DX: Disorder of the skin and subcutaneous tissue, unspecified: L98.9

## 2012-12-28 MED ORDER — LOVASTATIN 10 MG PO TABS: 40.0000 mg | ORAL_TABLET | Freq: Every day | ORAL | Status: DC

## 2012-12-28 NOTE — Assessment & Plan Note (Signed)
Coronary disease is stable. I'll see him back in one year. He is on very low-dose ineffective statin. We'll try to change into a higher dose more potent medication if he can afford it

## 2012-12-28 NOTE — Assessment & Plan Note (Signed)
This is stable 

## 2012-12-28 NOTE — Assessment & Plan Note (Signed)
Blood pressures control. No change in therapy. 

## 2012-12-28 NOTE — Patient Instructions (Addendum)
Your physician has recommended you make the following change in your medication: increase Mevacor to 40 mg at bedtime.   Your physician wants you to follow-up in: 1 year. You will receive a reminder letter in the mail two months in advance. If you don't receive a letter, please call our office to schedule the follow-up appointment.

## 2012-12-28 NOTE — Progress Notes (Signed)
HPI  The patient is doing well. Followup Dopplers revealed that his legs are stable. This was reviewed by Dr. Clifton James. Patient is not having any significant chest pain. He mentions a mild rash on the inner aspects of both thighs.  No Known Allergies  Current Outpatient Prescriptions  Medication Sig Dispense Refill  . aspirin 81 MG tablet Take 1 tablet (81 mg total) by mouth daily.  30 tablet  11  . atenolol (TENORMIN) 25 MG tablet TAKE ONE TABLET BY MOUTH EVERY DAY  90 tablet  4  . lisinopril (PRINIVIL,ZESTRIL) 20 MG tablet Take 1 tablet (20 mg total) by mouth daily.  90 tablet  3  . lovastatin (MEVACOR) 10 MG tablet Take 1 tablet (10 mg total) by mouth at bedtime.  90 tablet  3   No current facility-administered medications for this visit.    History   Social History  . Marital Status: Widowed    Spouse Name: N/A    Number of Children: 0  . Years of Education: N/A   Occupational History  . Works at [employer]    Social History Main Topics  . Smoking status: Current Every Day Smoker -- 0.50 packs/day for 40 years    Types: Cigarettes  . Smokeless tobacco: Not on file  . Alcohol Use: No  . Drug Use: No  . Sexually Active: Not on file   Other Topics Concern  . Not on file   Social History Narrative  . No narrative on file    Family History  Problem Relation Age of Onset  . Heart attack Father   . Diabetes Mother   . Cancer Brother     Past Medical History  Diagnosis Date  . CAD (coronary artery disease)     Bare-metal stent, LAD, 2007, total RCA, right to right collaterals  . Ejection fraction < 50%     EF 40% in the past  . Dyslipidemia   . Tobacco abuse   . Hypertension   . PAD (peripheral artery disease)     Arterial leg Doppler, December 29, 2011, greater than 50% focal stenosis of the left mid S.FA, right ABI mild range left ABI moderate range  //   consultation by Dr. Clifton James, Jan 19, 2012. CTA with severe right CIA stenosis, bil SFA.     Past  Surgical History  Procedure Laterality Date  . None      Patient Active Problem List   Diagnosis Date Noted  . PAD (peripheral artery disease)   . Claudication   . CAD (coronary artery disease)   . Ejection fraction < 50%   . Dyslipidemia   . Hypertension   . Tobacco abuse     ROS   Patient denies fever, chills, headache, sweats, Change in vision, change in hearing, chest pain, cough, nausea vomiting, urinary symptoms. All other systems are reviewed and are negative.  PHYSICAL EXAM  Patient is oriented to person time and place. Affect is normal. Lungs are clear. Respiratory effort is nonlabored. Cardiac exam reveals S1 and S2. There no clicks or significant murmurs. The abdomen is soft. There is no peripheral edema. I looked at the mild skin lesions on both aspects of his inner thighs. These do not appear to be infected. I doubt that this is drug related. This will need to be assessed by primary care physician.  Filed Vitals:   12/28/12 0913  BP: 140/77  Pulse: 74  Height: 5\' 8"  (1.727 m)  Weight: 186 lb  6.4 oz (84.55 kg)  SpO2: 98%     ASSESSMENT & PLAN

## 2012-12-28 NOTE — Assessment & Plan Note (Signed)
I've encouraged her to see her primary physician about this.

## 2013-03-16 ENCOUNTER — Other Ambulatory Visit: Payer: Self-pay | Admitting: Cardiology

## 2013-10-13 ENCOUNTER — Other Ambulatory Visit: Payer: Self-pay | Admitting: Cardiology

## 2013-12-28 ENCOUNTER — Encounter: Payer: Self-pay | Admitting: Cardiology

## 2014-01-02 ENCOUNTER — Ambulatory Visit: Payer: BC Managed Care – PPO | Admitting: Cardiology

## 2014-02-08 ENCOUNTER — Encounter: Payer: Self-pay | Admitting: Cardiology

## 2014-02-22 ENCOUNTER — Other Ambulatory Visit: Payer: Self-pay | Admitting: Cardiology

## 2014-03-26 ENCOUNTER — Other Ambulatory Visit: Payer: Self-pay | Admitting: Cardiology

## 2014-07-05 ENCOUNTER — Other Ambulatory Visit: Payer: Self-pay | Admitting: Cardiology

## 2014-08-09 ENCOUNTER — Encounter (HOSPITAL_COMMUNITY): Payer: Self-pay | Admitting: Cardiovascular Disease

## 2014-12-24 ENCOUNTER — Encounter: Payer: Self-pay | Admitting: Vascular Surgery

## 2015-01-03 ENCOUNTER — Encounter: Payer: Self-pay | Admitting: Vascular Surgery

## 2015-01-04 ENCOUNTER — Ambulatory Visit (INDEPENDENT_AMBULATORY_CARE_PROVIDER_SITE_OTHER): Payer: BLUE CROSS/BLUE SHIELD | Admitting: Cardiology

## 2015-01-04 ENCOUNTER — Ambulatory Visit (INDEPENDENT_AMBULATORY_CARE_PROVIDER_SITE_OTHER): Payer: BLUE CROSS/BLUE SHIELD | Admitting: Vascular Surgery

## 2015-01-04 ENCOUNTER — Encounter: Payer: Self-pay | Admitting: Vascular Surgery

## 2015-01-04 ENCOUNTER — Other Ambulatory Visit: Payer: Self-pay

## 2015-01-04 ENCOUNTER — Encounter: Payer: Self-pay | Admitting: Cardiology

## 2015-01-04 VITALS — BP 162/92 | HR 63 | Ht 68.0 in | Wt 184.4 lb

## 2015-01-04 VITALS — BP 184/83 | HR 76 | Ht 68.0 in | Wt 188.6 lb

## 2015-01-04 DIAGNOSIS — E785 Hyperlipidemia, unspecified: Secondary | ICD-10-CM

## 2015-01-04 DIAGNOSIS — I739 Peripheral vascular disease, unspecified: Secondary | ICD-10-CM | POA: Diagnosis not present

## 2015-01-04 DIAGNOSIS — I25118 Atherosclerotic heart disease of native coronary artery with other forms of angina pectoris: Secondary | ICD-10-CM | POA: Diagnosis not present

## 2015-01-04 DIAGNOSIS — I70229 Atherosclerosis of native arteries of extremities with rest pain, unspecified extremity: Secondary | ICD-10-CM | POA: Insufficient documentation

## 2015-01-04 DIAGNOSIS — Z01810 Encounter for preprocedural cardiovascular examination: Secondary | ICD-10-CM

## 2015-01-04 DIAGNOSIS — I2510 Atherosclerotic heart disease of native coronary artery without angina pectoris: Secondary | ICD-10-CM

## 2015-01-04 DIAGNOSIS — I998 Other disorder of circulatory system: Secondary | ICD-10-CM

## 2015-01-04 DIAGNOSIS — Z0279 Encounter for issue of other medical certificate: Secondary | ICD-10-CM

## 2015-01-04 HISTORY — DX: Encounter for preprocedural cardiovascular examination: Z01.810

## 2015-01-04 HISTORY — DX: Atherosclerosis of native arteries of extremities with rest pain, unspecified extremity: I70.229

## 2015-01-04 NOTE — Assessment & Plan Note (Signed)
Patient has severe symptomatic peripheral arterial disease. Evaluation is being planned with angiography on Jan 14, 2015. I am concerned about his angina. Nuclear study will be done early next week. I will be in touch with the patient with the data. I'm hopeful, that he will not need other further cardiac workup before his angiogram for his legs.

## 2015-01-04 NOTE — Patient Instructions (Signed)
**Note De-Identified Concetta Guion Obfuscation** Medication Instructions:  Same  Labwork: None  Testing/Procedures: Your physician has requested that you have a lexiscan myoview. For further information please visit [email or web address]. Please follow instruction sheet, as given.   Follow-Up: To be determined

## 2015-01-04 NOTE — Progress Notes (Signed)
Cardiology Office Note   Date:  01/04/2015   ID:  Austin Espinoza, DOB [birth year], MRN [id number]  PCP:  Bonnita Nasuti, MD  Cardiologist:  Dola Argyle, MD   Chief Complaint  Patient presents with  . Appointment    Follow-up coronary disease. Preoperative clearance for vascular surgery procedures      History of Present Illness: Austin Espinoza is a 59 y.o. male who presents today for follow-up of his coronary disease. He was referred by his primary physician. He has known peripheral vascular disease. This had been stable in the past. However more recently he's had increased symptoms. ABI on the left was 0.65. ABI on the right was 0.38. The patient was also scheduled to see Dr. Bridgett Larsson of vascular surgery before his visit with me today. He is artery even seen and arrangements have been made for him to have angiography on Jan 14, 2015.  The patient has known coronary disease. He had a intervention in 2007. I saw him last April, 2014. He was stable at that time. I had tried to arrange for him to obtain a more effective statin, fortunately he is on Crestor 10.   He is having chest discomfort. Sometimes when he becomes upset he has a chest burning. This is also limited him in terms of sexual activities.  As part of today's evaluation I have reviewed mild records. I have reviewed the note with the assessment by vascular surgery earlier today. I have reviewed outpatient records that were sent from the primary physician's team.   Past Medical History  Diagnosis Date  . CAD (coronary artery disease)     Bare-metal stent, LAD, 2007, total RCA, right to right collaterals  . Ejection fraction < 50%     EF 40% in the past  . Dyslipidemia   . Tobacco abuse   . Hypertension   . PAD (peripheral artery disease)     Arterial leg Doppler, December 29, 2011, greater than 50% focal stenosis of the left mid S.FA, right ABI mild range left ABI moderate range  //   consultation by Dr. Angelena Form, Jan 19, 2012. CTA with severe right CIA stenosis, bil SFA.   Marland Kitchen Hyperlipidemia     Past Surgical History  Procedure Laterality Date  . None    . Abdominal aortagram N/A 04/06/2012    Procedure: ABDOMINAL Maxcine Ham;  Surgeon: Burnell Blanks, MD;  Location: [facility] CATH LAB;  Service: Cardiovascular;  Laterality: N/A;    Patient Active Problem List   Diagnosis Date Noted  . Critical lower limb ischemia 01/04/2015  . Skin lesions, generalized 12/28/2012  . PAD (peripheral artery disease)   . Claudication   . CAD (coronary artery disease)   . Ejection fraction < 50%   . Dyslipidemia   . Hypertension   . Tobacco abuse       Current Outpatient Prescriptions  Medication Sig Dispense Refill  . aspirin 81 MG tablet Take 1 tablet (81 mg total) by mouth daily. 30 tablet 11  . atenolol (TENORMIN) 25 MG tablet TAKE ONE TABLET BY MOUTH ONCE DAILY 30 tablet 0  . Cholecalciferol (VITAMIN D) 2000 UNITS CAPS Take 1 capsule by mouth daily.     . diazepam (VALIUM) 5 MG tablet Take 5 mg by mouth every 6 (six) hours as needed for anxiety.    . DULoxetine (CYMBALTA) 30 MG capsule     . gabapentin (NEURONTIN) 300 MG capsule Take 300 mg by mouth 3 (three) times daily.    Marland Kitchen  HYDROcodone-acetaminophen (NORCO/VICODIN) 5-325 MG per tablet Take 1 tablet by mouth every 6 (six) hours as needed.     . Linaclotide (LINZESS) 145 MCG CAPS capsule Take 145 mcg by mouth daily.    Marland Kitchen lisinopril (PRINIVIL,ZESTRIL) 20 MG tablet TAKE ONE TABLET BY MOUTH ONCE DAILY. NEEDS  APPOINTMENT  FOR  FURTHER  REFILLS. 7 tablet 0  . metFORMIN (GLUCOPHAGE) 500 MG tablet Take 500 mg by mouth 2 (two) times daily with a meal.     . METFORMIN HCL PO Take 500 mg by mouth 2 (two) times daily.    . rosuvastatin (CRESTOR) 10 MG tablet Take 10 mg by mouth daily.    Marland Kitchen triamcinolone lotion (KENALOG) 0.1 % Apply 1 application topically 2 (two) times daily.     No current facility-administered medications for this visit.    Allergies:   Review of  patient's allergies indicates no known allergies.    Social History:  The patient  reports that he has been smoking Cigarettes.  He has a 20 pack-year smoking history. He has never used smokeless tobacco. He reports that he does not drink alcohol or use illicit drugs.   Family History:  The patient's family history includes Cancer in his brother; Diabetes in his mother; Heart attack in his father; Heart disease in his father; Hypertension in his sister.    ROS:  Please see the history of present illness.     Patient denies fever, chills, headache, sweats, rash, change in vision, change in hearing, cough, nausea or vomiting, urinary symptoms. All other systems are reviewed and are negative other than the history of present illness.   PHYSICAL EXAM: VS:  BP 162/92 mmHg  Pulse 63  Ht 5\' 8"  (1.727 m)  Wt 184 lb 6.4 oz (83.643 kg)  BMI 28.04 kg/m2 , Patient is oriented to person time and place. Affect is normal. Head is atraumatic. Sclera and conjunctiva are normal. There is no jugular venous distention. Lungs are clear. Respiratory effort is not labored. Cardiac exam Rales in S1 and S2. He has poor dentition. Abdomen is soft. There is no peripheral edema. There are no musculoskeletal deformities. There are no skin rashes.  EKG:  EKG is done today reviewed by me. He has LVH. There are no new changes.    Recent Labs: No results found for requested labs within last 365 days.    Lipid Panel No results found for: CHOL, TRIG, HDL, CHOLHDL, VLDL, LDLCALC, LDLDIRECT    Wt Readings from Last 3 Encounters:  01/04/15 184 lb 6.4 oz (83.643 kg)  01/04/15 188 lb 9.6 oz (85.548 kg)  12/28/12 186 lb 6.4 oz (84.55 kg)      Current medicines are reviewed       ASSESSMENT AND PLAN:

## 2015-01-04 NOTE — Assessment & Plan Note (Signed)
Patient has known coronary disease with a bare metal stent to the LAD in 2007. There was a total occlusion of the right coronary artery with right to right collaterals. He is having symptoms now that sound like angina. We will proceed with a pharmacologic stress nuclear scan. This will be very helpful information to have before he gets his peripheral vascular angiogram and possible treatment. I explained this to the patient.

## 2015-01-04 NOTE — Assessment & Plan Note (Signed)
He has severe peripheral arterial disease. The workup is ongoing with Dr. Bridgett Larsson.

## 2015-01-04 NOTE — Assessment & Plan Note (Signed)
His lipids are being treated. No change in therapy.

## 2015-01-04 NOTE — Progress Notes (Signed)
Referred by:  Dr. Celedonio Miyamoto  Reason for referral: BLE PAD  History of Present Illness  Austin Espinoza is a 59 y.o. (05-16-1956) male who presents with chief complaint: right > left leg numbness.  Onset of symptom occurred >1 year ago, without obvious trigger.  He notes chronic paraesthesia with intermittent frank anesthesia in R>L.   He has been told previous he has possible herniated disc in his back.  The patient notes short distance intermittent claudication along with intermittent rest pain which wakes him up.  Pain is described as cramping, severity 1-10/10, and associated with ambulation and sleeping.  Patient has attempted to treat this pain with rest.  The patient denies leg wounds/ulcers.  Atherosclerotic risk factors include: borderline DM, HTN, HLD, and active smoking.  Past Medical History  Diagnosis Date  . CAD (coronary artery disease)     Bare-metal stent, LAD, 2007, total RCA, right to right collaterals  . Ejection fraction < 50%     EF 40% in the past  . Dyslipidemia   . Tobacco abuse   . Hypertension   . PAD (peripheral artery disease)     Arterial leg Doppler, December 29, 2011, greater than 50% focal stenosis of the left mid S.FA, right ABI mild range left ABI moderate range  //   consultation by Dr. Angelena Form, Jan 19, 2012. CTA with severe right CIA stenosis, bil SFA.   Marland Kitchen Hyperlipidemia     Past Surgical History  Procedure Laterality Date  . None    . Abdominal aortagram N/A 04/06/2012    Procedure: ABDOMINAL Maxcine Ham;  Surgeon: Burnell Blanks, MD;  Location: [facility] CATH LAB;  Service: Cardiovascular;  Laterality: N/A;    History   Social History  . Marital Status: Widowed    Spouse Name: N/A  . Number of Children: 0  . Years of Education: N/A   Occupational History  . Works at [employer]  . Smoking status: Current Every Day Smoker -- 0.50 packs/day for 40 years    Types: Cigarettes  . Smokeless tobacco: Never Used    . Alcohol Use: No  . Drug Use: No  . Sexual Activity: Not on file   Other Topics Concern  . Not on file   Social History Narrative    Family History  Problem Relation Age of Onset  . Heart attack Father   . Heart disease Father   . Diabetes Mother   . Cancer Brother   . Hypertension Sister    Current Outpatient Prescriptions on File Prior to Visit  Medication Sig Dispense Refill  . aspirin 81 MG tablet Take 1 tablet (81 mg total) by mouth daily. 30 tablet 11  . atenolol (TENORMIN) 25 MG tablet TAKE ONE TABLET BY MOUTH ONCE DAILY 30 tablet 0  . Cholecalciferol (VITAMIN D) 2000 UNITS CAPS Take by mouth.    . diazepam (VALIUM) 5 MG tablet Take 5 mg by mouth every 6 (six) hours as needed for anxiety.    . gabapentin (NEURONTIN) 300 MG capsule Take 300 mg by mouth 3 (three) times daily.    Marland Kitchen lisinopril (PRINIVIL,ZESTRIL) 20 MG tablet TAKE ONE TABLET BY MOUTH ONCE DAILY. NEEDS  APPOINTMENT  FOR  FURTHER  REFILLS. 7 tablet 0  . METFORMIN HCL PO Take 500 mg by mouth 2 (two) times daily.    . rosuvastatin (CRESTOR) 10 MG tablet Take 10 mg by mouth daily.    Marland Kitchen triamcinolone lotion (  KENALOG) 0.1 % Apply 1 application topically 2 (two) times daily.    . enalapril (VASOTEC) 10 MG tablet TAKE ONE TABLET BY MOUTH EVERY DAY (Patient not taking: Reported on 01/04/2015) 90 tablet 3  . Linaclotide (LINZESS) 145 MCG CAPS capsule Take 145 mcg by mouth daily.    Marland Kitchen lovastatin (MEVACOR) 10 MG tablet Take 4 tablets (40 mg total) by mouth at bedtime. (Patient not taking: Reported on 01/04/2015) 90 tablet 3  . varenicline (CHANTIX PAK) 0.5 MG X 11 & 1 MG X 42 tablet Take 0.5 mg by mouth 2 (two) times daily. Take one 0.5 mg tablet by mouth once daily for 3 days, then increase to one 0.5 mg tablet twice daily for 4 days, then increase to one 1 mg tablet twice daily.     No current facility-administered medications on file prior to visit.    No Known Allergies   REVIEW OF SYSTEMS:  (Positives checked  otherwise negative)  CARDIOVASCULAR:  [x]  chest pain, []  chest pressure, []  palpitations, []  shortness of breath when laying flat, []  shortness of breath with exertion,  [x]  pain in feet when walking, [x]  pain in feet when laying flat, []  history of blood clot in veins (DVT), []  history of phlebitis, []  swelling in legs, []  varicose veins  PULMONARY:  []  productive cough, []  asthma, []  wheezing  NEUROLOGIC:  [x]  weakness in arms or legs, [x]  numbness in arms or legs, []  difficulty speaking or slurred speech, []  temporary loss of vision in one eye, []  dizziness  HEMATOLOGIC:  []  bleeding problems, []  problems with blood clotting too easily  MUSCULOSKEL:  []  joint pain, []  joint swelling  GASTROINTEST:  []  vomiting blood, []  blood in stool     GENITOURINARY:  []  burning with urination, []  blood in urine  PSYCHIATRIC:  []  history of major depression  INTEGUMENTARY:  []  rashes, []  ulcers  CONSTITUTIONAL:  []  fever, []  chills   For VQI Use Only  PRE-ADM LIVING: Home  AMB STATUS: Ambulatory  CAD Sx: None  PRIOR CHF: None  STRESS TEST: [x]  No, [ ]  Normal, [ ]  + ischemia, [ ]  + MI, [ ]  Both   Physical Examination  Filed Vitals:   01/04/15 1111 01/04/15 1115  BP: 187/87 184/83  Pulse: 76   Height: 5\' 8"  (1.727 m)   Weight: 188 lb 9.6 oz (85.548 kg)   SpO2: 99%    Body mass index is 28.68 kg/(m^2).  General: A&O x 3, WDWN  Head: [place]/AT  Ear/Nose/Throat: Hearing grossly intact, nares w/o erythema or drainage, oropharynx w/o Erythema/Exudate, Mallampati score: 3  Eyes: PERRLA, EOMI  Neck: Supple, no nuchal rigidity, no palpable LAD  Pulmonary: Sym exp, good air movt, CTAB, no rales, rhonchi, & wheezing  Cardiac: RRR, Nl S1, S2, no Murmurs, rubs or gallops  Vascular: Vessel Right Left  Radial Palpable Palpable  Ulnar Not Palpable Not Palpable  Brachial Palpable Palpable  Carotid Palpable, without bruit Palpable, without bruit  Aorta Not palpable N/A  Femoral  Palpable Palpable  Popliteal Not palpable Not palpable  PT Not Palpable Not Palpable  DP Not Palpable Not Palpable   Gastrointestinal: soft, NTND, -G/R, - HSM, - masses, - CVAT B  Musculoskeletal: M/S 5/5 throughout , Extremities without ischemic changes   Neurologic: CN 2-12 intact , Pain and light touch intact in extremities except decreased touch in R>L feet, Motor exam as listed above  Psychiatric: Judgment intact, Mood & affect appropriate for pt's clinical situation  Dermatologic:  See M/S exam for extremity exam, no rashes otherwise noted  Lymph : No Cervical, Axillary, or Inguinal lymphadenopathy    Non-Invasive Vascular Imaging  Outside ABI (Date: 01/04/2015)  R: 0.38  L: 0.65  Outside Studies/Documentation 8 pages of outside documents were reviewed including: outside ABI, outside PCP charts.  Medical Decision Making  Austin Espinoza is a 59 y.o. male who presents with: RLE critical limb ischemia, LLE intermittent claudication    Patient has a baseline of bilateral intermittent claudication with intermittent sx concerning for rest pain in right leg.  The patient's outside ABI are consistent with his right leg sx.  The paraesthesia and anesthesia however are more consistent the history of HNP.  I discussed with the patient the natural history of critical limb ischemia: 25% require amputation in one year, 50% are able to maintain their limbs in one year, and 25-30% die in one year due to comorbidities.  Given the limb threatening status of this patient, I recommend an aggressive work up including proceeding with an: Aortogram, Bilateral runoff and possible RLE intervention. I discussed with the patient the nature of angiographic procedures, especially the limited patencies of any endovascular intervention. The patient is aware of that the risks of an angiographic procedure include but are not limited to: bleeding, infection, access site complications, embolization,  rupture of treated vessel, dissection, possible need for emergent surgical intervention, and possible need for surgical procedures to treat the patient's pathology. The patient is aware of the risks and agrees to proceed.  The procedure is scheduled for: 16 MAY 16.  I discussed in depth with the patient the nature of atherosclerosis, and emphasized the importance of maximal medical management including strict control of blood pressure, blood glucose, and lipid levels, antiplatelet agents, obtaining regular exercise, and cessation of smoking.  The patient is aware that without maximal medical management the underlying atherosclerotic disease process will progress, limiting the benefit of any interventions. The patient is currently on a statin:  Crestor. The patient is currently on an anti-platelet: ASA.  Thank you for allowing [place] to participate in this patient's care.  Adele Barthel, MD [facility] Office: [phone] Pager: [phone]  01/04/2015, 12:26 PM

## 2015-01-08 ENCOUNTER — Telehealth: Payer: Self-pay | Admitting: Cardiology

## 2015-01-08 NOTE — Telephone Encounter (Signed)
error 

## 2015-01-09 ENCOUNTER — Telehealth (HOSPITAL_COMMUNITY): Payer: Self-pay | Admitting: *Deleted

## 2015-01-09 NOTE — Telephone Encounter (Signed)
Patient given detailed instructions per Myocardial Perfusion Study Information Sheet for test on 01/11/15 at 11:45 am Patient verbalized understanding. Kennyth Lose SmithRN

## 2015-01-11 ENCOUNTER — Telehealth: Payer: Self-pay | Admitting: *Deleted

## 2015-01-11 ENCOUNTER — Encounter: Payer: Self-pay | Admitting: Cardiology

## 2015-01-11 ENCOUNTER — Ambulatory Visit (HOSPITAL_COMMUNITY): Payer: BLUE CROSS/BLUE SHIELD | Attending: Internal Medicine

## 2015-01-11 DIAGNOSIS — Z01810 Encounter for preprocedural cardiovascular examination: Secondary | ICD-10-CM | POA: Diagnosis not present

## 2015-01-11 DIAGNOSIS — I25118 Atherosclerotic heart disease of native coronary artery with other forms of angina pectoris: Secondary | ICD-10-CM | POA: Insufficient documentation

## 2015-01-11 DIAGNOSIS — R9439 Abnormal result of other cardiovascular function study: Secondary | ICD-10-CM | POA: Diagnosis not present

## 2015-01-11 DIAGNOSIS — I10 Essential (primary) hypertension: Secondary | ICD-10-CM | POA: Insufficient documentation

## 2015-01-11 DIAGNOSIS — E119 Type 2 diabetes mellitus without complications: Secondary | ICD-10-CM | POA: Diagnosis not present

## 2015-01-11 LAB — MYOCARDIAL PERFUSION IMAGING
CHL CUP NUCLEAR SSS: 17
CHL CUP RESTING HR STRESS: 66 {beats}/min
CHL CUP STRESS STAGE 1 DBP: 83 mmHg
CHL CUP STRESS STAGE 1 GRADE: 0 %
CHL CUP STRESS STAGE 1 SBP: 145 mmHg
CHL CUP STRESS STAGE 2 SPEED: 0 mph
CHL CUP STRESS STAGE 4 GRADE: 0 %
CHL CUP STRESS STAGE 4 HR: 83 {beats}/min
CHL CUP STRESS STAGE 5 SBP: 153 mmHg
CSEPEW: 1 METS
CSEPPMHR: 52 %
LV dias vol: 183 mL
LV sys vol: 129 mL
NUC STRESS TID: 1.14
Nuc Stress EF: 29 %
Peak HR: 83 {beats}/min
RATE: 0.28
SDS: 2
SRS: 15
Stage 1 HR: 66 {beats}/min
Stage 1 Speed: 0 mph
Stage 2 Grade: 0 %
Stage 2 HR: 68 {beats}/min
Stage 3 DBP: 87 mmHg
Stage 3 Grade: 0 %
Stage 3 HR: 72 {beats}/min
Stage 3 SBP: 146 mmHg
Stage 3 Speed: 0 mph
Stage 4 Speed: 0 mph
Stage 5 DBP: 82 mmHg
Stage 5 Grade: 0 %
Stage 5 HR: 89 {beats}/min
Stage 5 Speed: 0 mph
Stage 6 DBP: 83 mmHg
Stage 6 Grade: 0 %
Stage 6 HR: 82 {beats}/min
Stage 6 SBP: 146 mmHg
Stage 6 Speed: 0 mph

## 2015-01-11 MED ORDER — REGADENOSON 0.4 MG/5ML IV SOLN
0.4000 mg | Freq: Once | INTRAVENOUS | Status: AC
  Administered 2015-01-11: 0.4 mg via INTRAVENOUS

## 2015-01-11 MED ORDER — TECHNETIUM TC 99M SESTAMIBI GENERIC - CARDIOLITE
33.0000 | Freq: Once | INTRAVENOUS | Status: AC | PRN
  Administered 2015-01-11: 33 via INTRAVENOUS

## 2015-01-11 MED ORDER — TECHNETIUM TC 99M SESTAMIBI GENERIC - CARDIOLITE
11.0000 | Freq: Once | INTRAVENOUS | Status: AC | PRN
  Administered 2015-01-11: 11 via INTRAVENOUS

## 2015-01-11 NOTE — Progress Notes (Signed)
I saw the patient in the office recently. I am aware that he will have peripheral angiogram next week by Dr. Bridgett Larsson. I arranged for a stress nuclear study to be sure that he is stable, as he had not been evaluated for a prolonged period of time. The nuclear stress study was done today, Jan 11, 2015. There is an old fixed defect that is significant. The EF by nuclear scan is in the range of 30%. There is no significant ischemia.  The patient appears stable at this time and will be proceeding with full evaluation by Dr. Bridgett Larsson.

## 2015-01-11 NOTE — Telephone Encounter (Signed)
Please call this patient and let him know that I have seen the result of his nuclear study from today. It is stable. It will be fine for him to proceed with his testing by Dr. Bridgett Larsson next week per Dr. Ron Parker.

## 2015-01-14 ENCOUNTER — Ambulatory Visit (HOSPITAL_COMMUNITY)
Admission: RE | Admit: 2015-01-14 | Discharge: 2015-01-14 | Disposition: A | Discharge status: Home / Self Care | Payer: BLUE CROSS/BLUE SHIELD | Source: Ambulatory Visit | Attending: Vascular Surgery | Admitting: Vascular Surgery

## 2015-01-14 ENCOUNTER — Telehealth: Payer: Self-pay | Admitting: Cardiology

## 2015-01-14 ENCOUNTER — Other Ambulatory Visit: Payer: Self-pay | Admitting: *Deleted

## 2015-01-14 ENCOUNTER — Encounter (HOSPITAL_COMMUNITY): Payer: BLUE CROSS/BLUE SHIELD

## 2015-01-14 ENCOUNTER — Encounter (HOSPITAL_COMMUNITY)
Admission: RE | Disposition: A | Discharge status: Home / Self Care | Payer: BLUE CROSS/BLUE SHIELD | Source: Ambulatory Visit | Attending: Vascular Surgery

## 2015-01-14 ENCOUNTER — Encounter (HOSPITAL_COMMUNITY): Payer: Self-pay | Admitting: Cardiology

## 2015-01-14 DIAGNOSIS — I2510 Atherosclerotic heart disease of native coronary artery without angina pectoris: Secondary | ICD-10-CM | POA: Diagnosis not present

## 2015-01-14 DIAGNOSIS — I70413 Atherosclerosis of autologous vein bypass graft(s) of the extremities with intermittent claudication, bilateral legs: Secondary | ICD-10-CM | POA: Diagnosis not present

## 2015-01-14 DIAGNOSIS — E785 Hyperlipidemia, unspecified: Secondary | ICD-10-CM | POA: Insufficient documentation

## 2015-01-14 DIAGNOSIS — I70421 Atherosclerosis of autologous vein bypass graft(s) of the extremities with rest pain, right leg: Secondary | ICD-10-CM | POA: Diagnosis not present

## 2015-01-14 DIAGNOSIS — I10 Essential (primary) hypertension: Secondary | ICD-10-CM | POA: Diagnosis not present

## 2015-01-14 DIAGNOSIS — I70223 Atherosclerosis of native arteries of extremities with rest pain, bilateral legs: Secondary | ICD-10-CM | POA: Insufficient documentation

## 2015-01-14 DIAGNOSIS — I70213 Atherosclerosis of native arteries of extremities with intermittent claudication, bilateral legs: Secondary | ICD-10-CM | POA: Diagnosis not present

## 2015-01-14 DIAGNOSIS — Z955 Presence of coronary angioplasty implant and graft: Secondary | ICD-10-CM | POA: Diagnosis not present

## 2015-01-14 DIAGNOSIS — F17210 Nicotine dependence, cigarettes, uncomplicated: Secondary | ICD-10-CM | POA: Diagnosis not present

## 2015-01-14 DIAGNOSIS — Z7982 Long term (current) use of aspirin: Secondary | ICD-10-CM | POA: Insufficient documentation

## 2015-01-14 DIAGNOSIS — I739 Peripheral vascular disease, unspecified: Secondary | ICD-10-CM | POA: Diagnosis present

## 2015-01-14 DIAGNOSIS — Z01810 Encounter for preprocedural cardiovascular examination: Secondary | ICD-10-CM

## 2015-01-14 HISTORY — PX: PERIPHERAL VASCULAR CATHETERIZATION: SHX172C

## 2015-01-14 LAB — POCT I-STAT, CHEM 8
BUN: 13 mg/dL (ref 6–20)
CHLORIDE: 105 mmol/L (ref 101–111)
CREATININE: 0.9 mg/dL (ref 0.61–1.24)
Calcium, Ion: 1.27 mmol/L (ref 1.13–1.30)
GLUCOSE: 102 mg/dL — AB (ref 65–99)
HCT: 38 % — ABNORMAL LOW (ref 39.0–52.0)
Hemoglobin: 12.9 g/dL — ABNORMAL LOW (ref 13.0–17.0)
POTASSIUM: 4.3 mmol/L (ref 3.5–5.1)
Sodium: 142 mmol/L (ref 135–145)
TCO2: 24 mmol/L (ref 0–100)

## 2015-01-14 LAB — GLUCOSE, CAPILLARY: Glucose-Capillary: 78 mg/dL (ref 65–99)

## 2015-01-14 SURGERY — ABDOMINAL AORTOGRAM
Anesthesia: LOCAL

## 2015-01-14 MED ORDER — IODIXANOL 320 MG/ML IV SOLN
INTRAVENOUS | Status: DC | PRN
  Administered 2015-01-14: 137 mL via INTRA_ARTERIAL

## 2015-01-14 MED ORDER — HYDROCOD POLST-CPM POLST ER 10-8 MG/5ML PO SUER: 5.0000 mL | Freq: Once | ORAL | Status: DC

## 2015-01-14 MED ORDER — HYDRALAZINE HCL 20 MG/ML IJ SOLN
INTRAMUSCULAR | Status: AC
  Filled 2015-01-14: qty 1

## 2015-01-14 MED ORDER — HYDROCOD POLST-CPM POLST ER 10-8 MG/5ML PO SUER
ORAL | Status: AC
  Filled 2015-01-14: qty 5

## 2015-01-14 MED ORDER — SODIUM CHLORIDE 0.9 % IV SOLN: 1.0000 mL/kg/h | INTRAVENOUS | Status: DC

## 2015-01-14 MED ORDER — HYDRALAZINE HCL 20 MG/ML IJ SOLN: 10.0000 mg | INTRAMUSCULAR | Status: DC | PRN

## 2015-01-14 MED ORDER — OXYCODONE-ACETAMINOPHEN 5-325 MG PO TABS
ORAL_TABLET | ORAL | Status: AC
  Administered 2015-01-14: 2
  Filled 2015-01-14: qty 2

## 2015-01-14 MED ORDER — MORPHINE SULFATE 2 MG/ML IJ SOLN: 2.0000 mg | INTRAMUSCULAR | Status: DC | PRN

## 2015-01-14 MED ORDER — HYDRALAZINE HCL 20 MG/ML IJ SOLN
INTRAMUSCULAR | Status: AC
  Administered 2015-01-14: 17:00:00
  Filled 2015-01-14: qty 1

## 2015-01-14 MED ORDER — SODIUM CHLORIDE 0.9 % IV SOLN
INTRAVENOUS | Status: DC
  Administered 2015-01-14: 10:00:00 via INTRAVENOUS

## 2015-01-14 MED ORDER — HYDRALAZINE HCL 20 MG/ML IJ SOLN
INTRAMUSCULAR | Status: DC | PRN
  Administered 2015-01-14: 10 mg via INTRAVENOUS

## 2015-01-14 MED ORDER — OXYCODONE-ACETAMINOPHEN 5-325 MG PO TABS: 1.0000 | ORAL_TABLET | Freq: Four times a day (QID) | ORAL | Status: DC | PRN

## 2015-01-14 SURGICAL SUPPLY — 9 items
CATH OMNI FLUSH 5F 65CM (CATHETERS) ×1 IMPLANT
COVER PRB 48X5XTLSCP FOLD TPE (BAG) IMPLANT
COVER PROBE 5X48 (BAG) ×3
KIT PV (KITS) ×3 IMPLANT
SHEATH PINNACLE 5F 10CM (SHEATH) ×1 IMPLANT
SYR MEDRAD MARK V 150ML (SYRINGE) IMPLANT
TRANSDUCER W/STOPCOCK (MISCELLANEOUS) ×3 IMPLANT
TRAY PV CATH (CUSTOM PROCEDURE TRAY) ×3 IMPLANT
WIRE BENTSON .035X145CM (WIRE) ×1 IMPLANT

## 2015-01-14 NOTE — Interval H&P Note (Signed)
[facility]  History and Physical Update  The patient was interviewed and re-examined.  The patient's previous History and Physical has been reviewed and is unchanged from my consult.  There is no change in the plan of care: Aortogram, bilateral leg runoff, and possible right leg intervention  I discussed with the patient the nature of angiographic procedures, especially the limited patencies of any endovascular intervention.  The patient is aware of that the risks of an angiographic procedure include but are not limited to: bleeding, infection, access site complications, renal failure, embolization, rupture of vessel, dissection, possible need for emergent surgical intervention, possible need for surgical procedures to treat the patient's pathology, anaphylactic reaction to contrast, and stroke and death.  The patient is aware of the risks and agrees to proceed.  Adele Barthel, MD [facility] [place] Office: [phone] Pager: [phone]  01/14/2015, 10:26 AM

## 2015-01-14 NOTE — Telephone Encounter (Signed)
**Note De-Identified Julie-Ann Vanmaanen Obfuscation** I called the pts home and spoke with the pts brother who states that the pt is having a procedure done at [facility]. at this time.  According to the pts chart he is having his testing with Dr Bridgett Larsson at this time.

## 2015-01-14 NOTE — Discharge Instructions (Signed)
Angiogram, Care After Refer to this sheet in the next few weeks. These instructions provide you with information on caring for yourself after your procedure. Your health care provider may also give you more specific instructions. Your treatment has been planned according to current medical practices, but problems sometimes occur. Call your health care provider if you have any problems or questions after your procedure.  WHAT TO EXPECT AFTER THE PROCEDURE After your procedure, it is typical to have the following sensations:  Minor discomfort or tenderness and a small bump at the catheter insertion site. The bump should usually decrease in size and tenderness within 1 to 2 weeks.  Any bruising will usually fade within 2 to 4 weeks. HOME CARE INSTRUCTIONS   You may need to keep taking blood thinners if they were prescribed for you. Take medicines only as directed by your health care provider.  Do not apply powder or lotion to the site.  Do not take baths, swim, or use a hot tub until your health care provider approves.  You may shower 24 hours after the procedure. Remove the bandage (dressing) and gently wash the site with plain soap and water. Gently pat the site dry.  Inspect the site at least twice daily.  Limit your activity for the first 48 hours. Do not bend, squat, or lift anything over 20 lb (9 kg) or as directed by your health care provider.  Plan to have someone take you home after the procedure. Follow instructions about when you can drive or return to work. SEEK MEDICAL CARE IF:  You get light-headed when standing up.  You have drainage (other than a small amount of blood on the dressing).  You have chills.  You have a fever.  You have redness, warmth, swelling, or pain at the insertion site. SEEK IMMEDIATE MEDICAL CARE IF:   You develop chest pain or shortness of breath, feel faint, or pass out.  You have bleeding, swelling larger than a walnut, or drainage from the  catheter insertion site.  You develop pain, discoloration, coldness, or severe bruising in the leg or arm that held the catheter.  You develop bleeding from any other place, such as the bowels. You may see bright red blood in your urine or stools, or your stools may appear black and tarry.  You have heavy bleeding from the site. If this happens, hold pressure on the site. MAKE SURE YOU:  Understand these instructions.  Will watch your condition.  Will get help right away if you are not doing well or get worse. Document Released: 03/05/2005 Document Revised: 01/01/2014 Document Reviewed: 01/09/2013 [facility] Patient Information 2015 [place], [place]. This information is not intended to replace advice given to you by your health care provider. Make sure you discuss any questions you have with your health care provider.  No metformin for 2 days

## 2015-01-14 NOTE — H&P (View-Only) (Signed)
Referred by:  Dr. Celedonio Miyamoto  Reason for referral: BLE PAD  History of Present Illness  Austin Espinoza is a 59 y.o. (08-08-1956) male who presents with chief complaint: right > left leg numbness.  Onset of symptom occurred >1 year ago, without obvious trigger.  He notes chronic paraesthesia with intermittent frank anesthesia in R>L.   He has been told previous he has possible herniated disc in his back.  The patient notes short distance intermittent claudication along with intermittent rest pain which wakes him up.  Pain is described as cramping, severity 1-10/10, and associated with ambulation and sleeping.  Patient has attempted to treat this pain with rest.  The patient denies leg wounds/ulcers.  Atherosclerotic risk factors include: borderline DM, HTN, HLD, and active smoking.  Past Medical History  Diagnosis Date  . CAD (coronary artery disease)     Bare-metal stent, LAD, 2007, total RCA, right to right collaterals  . Ejection fraction < 50%     EF 40% in the past  . Dyslipidemia   . Tobacco abuse   . Hypertension   . PAD (peripheral artery disease)     Arterial leg Doppler, December 29, 2011, greater than 50% focal stenosis of the left mid S.FA, right ABI mild range left ABI moderate range  //   consultation by Dr. Angelena Form, Jan 19, 2012. CTA with severe right CIA stenosis, bil SFA.   Marland Kitchen Hyperlipidemia     Past Surgical History  Procedure Laterality Date  . None    . Abdominal aortagram N/A 04/06/2012    Procedure: ABDOMINAL Maxcine Ham;  Surgeon: Burnell Blanks, MD;  Location: [facility] CATH LAB;  Service: Cardiovascular;  Laterality: N/A;    History   Social History  . Marital Status: Widowed    Spouse Name: N/A  . Number of Children: 0  . Years of Education: N/A   Occupational History  . Works at [employer]ics  . Smoking status: Current Every Day Smoker -- 0.50 packs/day for 40 years    Types: Cigarettes  . Smokeless tobacco: Never Used    . Alcohol Use: No  . Drug Use: No  . Sexual Activity: Not on file   Other Topics Concern  . Not on file   Social History Narrative    Family History  Problem Relation Age of Onset  . Heart attack Father   . Heart disease Father   . Diabetes Mother   . Cancer Brother   . Hypertension Sister    Current Outpatient Prescriptions on File Prior to Visit  Medication Sig Dispense Refill  . aspirin 81 MG tablet Take 1 tablet (81 mg total) by mouth daily. 30 tablet 11  . atenolol (TENORMIN) 25 MG tablet TAKE ONE TABLET BY MOUTH ONCE DAILY 30 tablet 0  . Cholecalciferol (VITAMIN D) 2000 UNITS CAPS Take by mouth.    . diazepam (VALIUM) 5 MG tablet Take 5 mg by mouth every 6 (six) hours as needed for anxiety.    . gabapentin (NEURONTIN) 300 MG capsule Take 300 mg by mouth 3 (three) times daily.    Marland Kitchen lisinopril (PRINIVIL,ZESTRIL) 20 MG tablet TAKE ONE TABLET BY MOUTH ONCE DAILY. NEEDS  APPOINTMENT  FOR  FURTHER  REFILLS. 7 tablet 0  . METFORMIN HCL PO Take 500 mg by mouth 2 (two) times daily.    . rosuvastatin (CRESTOR) 10 MG tablet Take 10 mg by mouth daily.    Marland Kitchen triamcinolone lotion (  KENALOG) 0.1 % Apply 1 application topically 2 (two) times daily.    . enalapril (VASOTEC) 10 MG tablet TAKE ONE TABLET BY MOUTH EVERY DAY (Patient not taking: Reported on 01/04/2015) 90 tablet 3  . Linaclotide (LINZESS) 145 MCG CAPS capsule Take 145 mcg by mouth daily.    Marland Kitchen lovastatin (MEVACOR) 10 MG tablet Take 4 tablets (40 mg total) by mouth at bedtime. (Patient not taking: Reported on 01/04/2015) 90 tablet 3  . varenicline (CHANTIX PAK) 0.5 MG X 11 & 1 MG X 42 tablet Take 0.5 mg by mouth 2 (two) times daily. Take one 0.5 mg tablet by mouth once daily for 3 days, then increase to one 0.5 mg tablet twice daily for 4 days, then increase to one 1 mg tablet twice daily.     No current facility-administered medications on file prior to visit.    No Known Allergies   REVIEW OF SYSTEMS:  (Positives checked  otherwise negative)  CARDIOVASCULAR:  [x]  chest pain, []  chest pressure, []  palpitations, []  shortness of breath when laying flat, []  shortness of breath with exertion,  [x]  pain in feet when walking, [x]  pain in feet when laying flat, []  history of blood clot in veins (DVT), []  history of phlebitis, []  swelling in legs, []  varicose veins  PULMONARY:  []  productive cough, []  asthma, []  wheezing  NEUROLOGIC:  [x]  weakness in arms or legs, [x]  numbness in arms or legs, []  difficulty speaking or slurred speech, []  temporary loss of vision in one eye, []  dizziness  HEMATOLOGIC:  []  bleeding problems, []  problems with blood clotting too easily  MUSCULOSKEL:  []  joint pain, []  joint swelling  GASTROINTEST:  []  vomiting blood, []  blood in stool     GENITOURINARY:  []  burning with urination, []  blood in urine  PSYCHIATRIC:  []  history of major depression  INTEGUMENTARY:  []  rashes, []  ulcers  CONSTITUTIONAL:  []  fever, []  chills   For VQI Use Only  PRE-ADM LIVING: Home  AMB STATUS: Ambulatory  CAD Sx: None  PRIOR CHF: None  STRESS TEST: [x]  No, [ ]  Normal, [ ]  + ischemia, [ ]  + MI, [ ]  Both   Physical Examination  Filed Vitals:   01/04/15 1111 01/04/15 1115  BP: 187/87 184/83  Pulse: 76   Height: 5\' 8"  (1.727 m)   Weight: 188 lb 9.6 oz (85.548 kg)   SpO2: 99%    Body mass index is 28.68 kg/(m^2).  General: A&O x 3, WDWN  Head: [place]/AT  Ear/Nose/Throat: Hearing grossly intact, nares w/o erythema or drainage, oropharynx w/o Erythema/Exudate, Mallampati score: 3  Eyes: PERRLA, EOMI  Neck: Supple, no nuchal rigidity, no palpable LAD  Pulmonary: Sym exp, good air movt, CTAB, no rales, rhonchi, & wheezing  Cardiac: RRR, Nl S1, S2, no Murmurs, rubs or gallops  Vascular: Vessel Right Left  Radial Palpable Palpable  Ulnar Not Palpable Not Palpable  Brachial Palpable Palpable  Carotid Palpable, without bruit Palpable, without bruit  Aorta Not palpable N/A  Femoral  Palpable Palpable  Popliteal Not palpable Not palpable  PT Not Palpable Not Palpable  DP Not Palpable Not Palpable   Gastrointestinal: soft, NTND, -G/R, - HSM, - masses, - CVAT B  Musculoskeletal: M/S 5/5 throughout , Extremities without ischemic changes   Neurologic: CN 2-12 intact , Pain and light touch intact in extremities except decreased touch in R>L feet, Motor exam as listed above  Psychiatric: Judgment intact, Mood & affect appropriate for pt's clinical situation  Dermatologic:  See M/S exam for extremity exam, no rashes otherwise noted  Lymph : No Cervical, Axillary, or Inguinal lymphadenopathy    Non-Invasive Vascular Imaging  Outside ABI (Date: 01/04/2015)  R: 0.38  L: 0.65  Outside Studies/Documentation 8 pages of outside documents were reviewed including: outside ABI, outside PCP charts.  Medical Decision Making  Austin Espinoza is a 59 y.o. male who presents with: RLE critical limb ischemia, LLE intermittent claudication    Patient has a baseline of bilateral intermittent claudication with intermittent sx concerning for rest pain in right leg.  The patient's outside ABI are consistent with his right leg sx.  The paraesthesia and anesthesia however are more consistent the history of HNP.  I discussed with the patient the natural history of critical limb ischemia: 25% require amputation in one year, 50% are able to maintain their limbs in one year, and 25-30% die in one year due to comorbidities.  Given the limb threatening status of this patient, I recommend an aggressive work up including proceeding with an: Aortogram, Bilateral runoff and possible RLE intervention. I discussed with the patient the nature of angiographic procedures, especially the limited patencies of any endovascular intervention. The patient is aware of that the risks of an angiographic procedure include but are not limited to: bleeding, infection, access site complications, embolization,  rupture of treated vessel, dissection, possible need for emergent surgical intervention, and possible need for surgical procedures to treat the patient's pathology. The patient is aware of the risks and agrees to proceed.  The procedure is scheduled for: 16 MAY 16.  I discussed in depth with the patient the nature of atherosclerosis, and emphasized the importance of maximal medical management including strict control of blood pressure, blood glucose, and lipid levels, antiplatelet agents, obtaining regular exercise, and cessation of smoking.  The patient is aware that without maximal medical management the underlying atherosclerotic disease process will progress, limiting the benefit of any interventions. The patient is currently on a statin:  Crestor. The patient is currently on an anti-platelet: ASA.  Thank you for allowing [place] to participate in this patient's care.  Adele Barthel, MD [facility] Office: [phone] Pager: [phone]  01/04/2015, 12:26 PM

## 2015-01-16 ENCOUNTER — Telehealth: Payer: Self-pay | Admitting: Vascular Surgery

## 2015-01-16 ENCOUNTER — Other Ambulatory Visit: Payer: Self-pay

## 2015-01-16 NOTE — Telephone Encounter (Signed)
-----   Message from Mena Goes, RN sent at 01/14/2015  4:50 PM EDT ----- Regarding: Schedule   ----- Message -----    From: Conrad East Carroll, MD    Sent: 01/14/2015   2:04 PM      To: [facility] Charge Pool  Austin Espinoza [id number] Feb 25, 1956  PROCEDURE: 1.  Left common femoral artery cannulation under ultrasound guidance 2.  Placement of catheter in aorta 3.  Aortogram 4.  Bilateral leg runoff via catheter  Orders(s) for follow-up:  ASAP cardiology evaluation for left to right fem-fem BPG

## 2015-01-16 NOTE — Telephone Encounter (Signed)
This gentleman just had a myoview stress test on 01/11/15. I spoke with our scheduling nurse who suggested we send clearance form to Dr Ron Parker office. This has been faxed to # [phone] with URGENT status- original given to [facility] for follow up. dm

## 2015-01-18 NOTE — Pre-Procedure Instructions (Signed)
Austin Espinoza  01/18/2015      [facility] [address], [place] [address]  [place] [address] Phone: [phone] Fax: [phone]    Your procedure is scheduled on Tues, May 31 @ 7:30 AM  Report to [facility] at 5:30 AM  Call this number if you have problems the morning of surgery:  [phone]   Remember:  Do not eat food or drink liquids after midnight.  Take these medicines the morning of surgery with A SIP OF WATER Atenolol(Tenormin),Valium(Diazepam),Gabapentin(Neurontin),and Pain Pill(if needed)              Stop taking your Aspirin. No Goody's,BC's,Aleve,Ibuprofen,Fish Oil,or any Herbal Medications.    Do not wear jewelry.  Do not wear lotions, powders, or colognes.  You may wear deodorant.  Men may shave face and neck.  Do not bring valuables to the hospital.  [facility] is not responsible for any belongings or valuables.  Contacts, dentures or bridgework may not be worn into surgery.  Leave your suitcase in the car.  After surgery it may be brought to your room.  For patients admitted to the hospital, discharge time will be determined by your treatment team.  Patients discharged the day of surgery will not be allowed to drive home.    Special instructions:  [place] - Preparing for Surgery  Before surgery, you can play an important role.  Because skin is not sterile, your skin needs to be as free of germs as possible.  You can reduce the number of germs on you skin by washing with CHG (chlorahexidine gluconate) soap before surgery.  CHG is an antiseptic cleaner which kills germs and bonds with the skin to continue killing germs even after washing.  Please DO NOT use if you have an allergy to CHG or antibacterial soaps.  If your skin becomes reddened/irritated stop using the CHG and inform your nurse when you arrive at Short Stay.  Do not shave (including legs and underarms) for at least 48 hours prior to  the first CHG shower.  You may shave your face.  Please follow these instructions carefully:   1.  Shower with CHG Soap the night before surgery and the                                morning of Surgery.  2.  If you choose to wash your hair, wash your hair first as usual with your       normal shampoo.  3.  After you shampoo, rinse your hair and body thoroughly to remove the                      Shampoo.  4.  Use CHG as you would any other liquid soap.  You can apply chg directly       to the skin and wash gently with scrungie or a clean washcloth.  5.  Apply the CHG Soap to your body ONLY FROM THE NECK DOWN.        Do not use on open wounds or open sores.  Avoid contact with your eyes,       ears, mouth and genitals (private parts).  Wash genitals (private parts)       with your normal soap.  6.  Wash thoroughly, paying special attention to the area where your surgery  will be performed.  7.  Thoroughly rinse your body with warm water from the neck down.  8.  DO NOT shower/wash with your normal soap after using and rinsing off       the CHG Soap.  9.  Pat yourself dry with a clean towel.            10.  Wear clean pajamas.            11.  Place clean sheets on your bed the night of your first shower and do not        sleep with pets.  Day of Surgery  Do not apply any lotions/deoderants the morning of surgery.  Please wear clean clothes to the hospital/surgery center.    Please read over the following fact sheets that you were given. Pain Booklet, Coughing and Deep Breathing, Blood Transfusion Information, MRSA Information and Surgical Site Infection Prevention

## 2015-01-21 ENCOUNTER — Encounter: Payer: Self-pay | Admitting: Cardiology

## 2015-01-21 ENCOUNTER — Encounter (HOSPITAL_COMMUNITY): Payer: Self-pay

## 2015-01-21 ENCOUNTER — Encounter (HOSPITAL_COMMUNITY)
Admission: RE | Admit: 2015-01-21 | Discharge: 2015-01-21 | Disposition: A | Discharge status: Home / Self Care | Payer: BLUE CROSS/BLUE SHIELD | Source: Ambulatory Visit | Attending: Vascular Surgery | Admitting: Vascular Surgery

## 2015-01-21 DIAGNOSIS — E119 Type 2 diabetes mellitus without complications: Secondary | ICD-10-CM | POA: Insufficient documentation

## 2015-01-21 DIAGNOSIS — Z01812 Encounter for preprocedural laboratory examination: Secondary | ICD-10-CM | POA: Insufficient documentation

## 2015-01-21 DIAGNOSIS — I10 Essential (primary) hypertension: Secondary | ICD-10-CM | POA: Diagnosis not present

## 2015-01-21 DIAGNOSIS — I2510 Atherosclerotic heart disease of native coronary artery without angina pectoris: Secondary | ICD-10-CM | POA: Diagnosis not present

## 2015-01-21 DIAGNOSIS — Z955 Presence of coronary angioplasty implant and graft: Secondary | ICD-10-CM | POA: Diagnosis not present

## 2015-01-21 DIAGNOSIS — E785 Hyperlipidemia, unspecified: Secondary | ICD-10-CM | POA: Insufficient documentation

## 2015-01-21 LAB — CBC
HCT: 36.8 % — ABNORMAL LOW (ref 39.0–52.0)
Hemoglobin: 12.3 g/dL — ABNORMAL LOW (ref 13.0–17.0)
MCH: 27.8 pg (ref 26.0–34.0)
MCHC: 33.4 g/dL (ref 30.0–36.0)
MCV: 83.1 fL (ref 78.0–100.0)
Platelets: 323 10*3/uL (ref 150–400)
RBC: 4.43 MIL/uL (ref 4.22–5.81)
RDW: 16.2 % — ABNORMAL HIGH (ref 11.5–15.5)
WBC: 6.8 10*3/uL (ref 4.0–10.5)

## 2015-01-21 LAB — SURGICAL PCR SCREEN
MRSA, PCR: NEGATIVE
Staphylococcus aureus: NEGATIVE

## 2015-01-21 LAB — URINALYSIS, ROUTINE W REFLEX MICROSCOPIC
BILIRUBIN URINE: NEGATIVE
Glucose, UA: NEGATIVE mg/dL
HGB URINE DIPSTICK: NEGATIVE
KETONES UR: NEGATIVE mg/dL
LEUKOCYTES UA: NEGATIVE
Nitrite: NEGATIVE
PROTEIN: NEGATIVE mg/dL
SPECIFIC GRAVITY, URINE: 1.02 (ref 1.005–1.030)
UROBILINOGEN UA: 0.2 mg/dL (ref 0.0–1.0)
pH: 5.5 (ref 5.0–8.0)

## 2015-01-21 LAB — PROTIME-INR
INR: 1.09 (ref 0.00–1.49)
Prothrombin Time: 14.3 seconds (ref 11.6–15.2)

## 2015-01-21 LAB — COMPREHENSIVE METABOLIC PANEL WITH GFR
ALT: 15 U/L — ABNORMAL LOW (ref 17–63)
AST: 19 U/L (ref 15–41)
Albumin: 3.8 g/dL (ref 3.5–5.0)
Alkaline Phosphatase: 56 U/L (ref 38–126)
Anion gap: 8 (ref 5–15)
BUN: 11 mg/dL (ref 6–20)
CO2: 24 mmol/L (ref 22–32)
Calcium: 10.4 mg/dL — ABNORMAL HIGH (ref 8.9–10.3)
Chloride: 107 mmol/L (ref 101–111)
Creatinine, Ser: 0.92 mg/dL (ref 0.61–1.24)
GFR calc Af Amer: >60 mL/min (ref >60)
GFR calc non Af Amer: >60 mL/min (ref >60)
Glucose, Bld: 95 mg/dL (ref 65–99)
Potassium: 4.4 mmol/L (ref 3.5–5.1)
Sodium: 139 mmol/L (ref 135–145)
Total Bilirubin: 0.4 mg/dL (ref 0.3–1.2)
Total Protein: 7.6 g/dL (ref 6.5–8.1)

## 2015-01-21 LAB — ABO/RH: ABO/RH(D): O POS

## 2015-01-21 LAB — GLUCOSE, CAPILLARY: Glucose-Capillary: 80 mg/dL (ref 65–99)

## 2015-01-21 LAB — APTT: aPTT: 34 s (ref 24–37)

## 2015-01-21 NOTE — Progress Notes (Signed)
PCP is Clinical research associate and Cardiologist is Dola Argyle. Patient denied having any acute cardiac or pulmonary issues.   When asked about fasting glucose, patient stated that he does not check his blood sugars at home, and patient was unaware of what a A1C was. Glucose upon arrival to PAT was 80. Patient stated he consumed one piece of bread and drank some coffee this morning. Patient denied having taken his metformin this morning.  Patient's sister Hoyle Sauer at chair side during PAT visit. Nurse explained DOS and CHG instructions numerous times to both patient and his sister. Both verbalized understanding.   Nurse inquired about aspirin and patient stated he was not instructed to stop taking aspirin. Nurse instructed patient to call [facility] and inquire about aspirin. Direct number to [facility] given. Patients sister stated "we will call as soon as we leave here."

## 2015-01-21 NOTE — Pre-Procedure Instructions (Signed)
Austin Espinoza  01/21/2015      [facility] [address], [place] [address] [place] [address] Phone: [phone] Fax: [phone]    Your procedure is scheduled on Tues, May 31 @ 7:30 AM  Report to [facility] at 5:30 AM  Call this number if you have problems the morning of surgery: [phone]     Remember:  Do not eat food or drink liquids after midnight.  Take these medicines the morning of surgery with A SIP OF WATER Atenolol(Tenormin),Valium(Diazepam),Gabapentin(Neurontin),and Pain Pill(if needed)              Stop taking your Aspirin. No Goody's,BC's,Aleve,Ibuprofen,Fish Oil,or any Herbal Medications.   Do NOT take any diabetic medications the morning of your surgery (Ex. Metformin/Glucophage)   Do not wear jewelry.  Do not wear lotions, powders, or colognes.  You may wear deodorant.  Men may shave face and neck.  Do not bring valuables to the hospital.  [facility] is not responsible for any belongings or valuables.  Contacts, dentures or bridgework may not be worn into surgery.  Leave your suitcase in the car.  After surgery it may be brought to your room.  For patients admitted to the hospital, discharge time will be determined by your treatment team.  Patients discharged the day of surgery will not be allowed to drive home.    Special instructions:  [place] - Preparing for Surgery  Before surgery, you can play an important role.  Because skin is not sterile, your skin needs to be as free of germs as possible.  You can reduce the number of germs on you skin by washing with CHG (chlorahexidine gluconate) soap before surgery.  CHG is an antiseptic cleaner which kills germs and bonds with the skin to continue killing germs even after washing.  Please DO NOT use if you have an allergy to CHG or antibacterial soaps.  If your skin becomes reddened/irritated stop using the CHG and inform your nurse when you arrive  at Short Stay.  Do not shave (including legs and underarms) for at least 48 hours prior to the first CHG shower.  You may shave your face.  Please follow these instructions carefully:   1.  Shower with CHG Soap the night before surgery and the                                morning of Surgery.  2.  If you choose to wash your hair, wash your hair first as usual with your       normal shampoo.  3.  After you shampoo, rinse your hair and body thoroughly to remove the                      Shampoo.  4.  Use CHG as you would any other liquid soap.  You can apply chg directly       to the skin and wash gently with scrungie or a clean washcloth.  5.  Apply the CHG Soap to your body ONLY FROM THE NECK DOWN.        Do not use on open wounds or open sores.  Avoid contact with your eyes,       ears, mouth and genitals (private parts).  Wash genitals (private parts)       with your normal soap.  6.  Wash thoroughly, paying special attention to the area where your surgery        will be performed.  7.  Thoroughly rinse your body with warm water from the neck down.  8.  DO NOT shower/wash with your normal soap after using and rinsing off       the CHG Soap.  9.  Pat yourself dry with a clean towel.            10.  Wear clean pajamas.            11.  Place clean sheets on your bed the night of your first shower and do not        sleep with pets.  Day of Surgery  Do not apply any lotions/deoderants the morning of surgery.  Please wear clean clothes to the hospital/surgery center.    Please read over the following fact sheets that you were given. Pain Booklet, Coughing and Deep Breathing, Blood Transfusion Information, MRSA Information and Surgical Site Infection Prevention

## 2015-01-21 NOTE — Progress Notes (Addendum)
Anesthesia Chart Review:  Pt is 59 year old male scheduled for L femoral-R femoral artery bypass graft on 01/29/2015 with Dr. Bridgett Larsson.   PCP is Dr. Donnetta Hutching. Cardiologist is Dr. Ron Parker, last office visit 01/04/2015.   PMH includes: CAD (Bare-metal stent, LAD, 2007, total occlusion RCA, right to right collaterals), PAD, DM, HTN, dyslipidemia. Current smoker. BMI 24.   Preoperative labs reviewed.    EKG 01/04/15: NSR. LVH with repolarization abnormality  Nuclear stress test 01/11/2015: Study Impression Myocardial perfusion is abnormal. Findings consistent with prior myocardial infarction. This is a high risk study. Overall left ventricular systolic function was abnormal. The left ventricular ejection fraction is severely decreased (<30%).  Conclusion: There was a large, severe fixed inferior/inferolateral defect suggestive of prior infarction. No ischemia. High risk study due to low EF (29%) with inferior and inferolateral akinesis.   Cardiac cath 02/24/2006: 1. The left main coronary artery had 20% distal lesion. 2. Left anterior descending artery had an 80% stenosis in the mid vessel just after the takeoff of the first diagonal branch. The first diagonal branch had a 70% tubular lesion. The distal LAD was normal. Successful bare-metal stent placed in the mid-left anterior descending artery .  3. Circumflex coronary artery was codominant. 4. The first obtuse marginal branch had a 50% tubular lesion. 5. The right coronary artery had a 90% eccentric lesion proximally. It was 100% occluded after the first RV branch. There were some left-to-right collaterals to the codominant PDA through septal perforators. 6. EF 35-40%  Pt has cardiac clearance from Dr. Ron Parker at moderate risk.  (See procedure note dated 01/21/2015 under media tab).   If no changes, I anticipate pt can proceed with surgery as scheduled.   Willeen Cass, FNP-BC [facility] Short Stay Surgical Center/Anesthesiology Phone:  [phone] 01/22/2015 3:56 PM

## 2015-01-21 NOTE — Progress Notes (Signed)
   01/21/15 0818  OBSTRUCTIVE SLEEP APNEA  Have you ever been diagnosed with sleep apnea through a sleep study? No  Do you snore loudly (loud enough to be heard through closed doors)?  0  Do you often feel tired, fatigued, or sleepy during the daytime? 0  Has anyone observed you stop breathing during your sleep? 0  Do you have, or are you being treated for high blood pressure? 1  BMI more than 35 kg/m2? 0  Age over 61 years old? 1  Neck circumference greater than 40 cm/16 inches? 1  Gender: 1  Obstructive Sleep Apnea Score 4   This patient has screened at risk for sleep apnea using the STOP bang tool used during a pre-surgical visit. A score of 4 or greater is at risk for sleep apnea.

## 2015-01-22 LAB — HEMOGLOBIN A1C
HEMOGLOBIN A1C: 6.3 % — AB (ref 4.8–5.6)
Mean Plasma Glucose: 134 mg/dL

## 2015-01-24 NOTE — Telephone Encounter (Signed)
Error

## 2015-01-28 MED ORDER — CHLORHEXIDINE GLUCONATE CLOTH 2 % EX PADS: 6.0000 | MEDICATED_PAD | Freq: Once | CUTANEOUS | Status: DC

## 2015-01-28 MED ORDER — CEFUROXIME SODIUM 1.5 G IJ SOLR
1.5000 g | INTRAMUSCULAR | Status: AC
  Administered 2015-01-29 (×2): 1.5 g via INTRAVENOUS
  Filled 2015-01-28: qty 1.5

## 2015-01-28 MED ORDER — SODIUM CHLORIDE 0.9 % IV SOLN: INTRAVENOUS | Status: DC

## 2015-01-29 ENCOUNTER — Encounter (HOSPITAL_COMMUNITY): Payer: Self-pay | Admitting: *Deleted

## 2015-01-29 ENCOUNTER — Inpatient Hospital Stay (HOSPITAL_COMMUNITY): Payer: BLUE CROSS/BLUE SHIELD

## 2015-01-29 ENCOUNTER — Encounter (HOSPITAL_COMMUNITY)
Admission: RE | Disposition: A | Discharge status: Home / Self Care | Payer: Self-pay | Source: Ambulatory Visit | Attending: Vascular Surgery

## 2015-01-29 ENCOUNTER — Inpatient Hospital Stay (HOSPITAL_COMMUNITY)
Admission: RE | Admit: 2015-01-29 | Discharge: 2015-01-31 | DRG: 271 | Disposition: A | Discharge status: Home / Self Care | Payer: BLUE CROSS/BLUE SHIELD | Source: Ambulatory Visit | Attending: Vascular Surgery | Admitting: Vascular Surgery

## 2015-01-29 ENCOUNTER — Inpatient Hospital Stay (HOSPITAL_COMMUNITY): Payer: BLUE CROSS/BLUE SHIELD | Admitting: Emergency Medicine

## 2015-01-29 ENCOUNTER — Inpatient Hospital Stay (HOSPITAL_COMMUNITY): Payer: BLUE CROSS/BLUE SHIELD | Admitting: Anesthesiology

## 2015-01-29 DIAGNOSIS — Z833 Family history of diabetes mellitus: Secondary | ICD-10-CM | POA: Diagnosis not present

## 2015-01-29 DIAGNOSIS — D62 Acute posthemorrhagic anemia: Secondary | ICD-10-CM | POA: Diagnosis present

## 2015-01-29 DIAGNOSIS — I10 Essential (primary) hypertension: Secondary | ICD-10-CM | POA: Diagnosis present

## 2015-01-29 DIAGNOSIS — I2510 Atherosclerotic heart disease of native coronary artery without angina pectoris: Secondary | ICD-10-CM | POA: Diagnosis present

## 2015-01-29 DIAGNOSIS — Z79899 Other long term (current) drug therapy: Secondary | ICD-10-CM

## 2015-01-29 DIAGNOSIS — I70221 Atherosclerosis of native arteries of extremities with rest pain, right leg: Secondary | ICD-10-CM | POA: Diagnosis not present

## 2015-01-29 DIAGNOSIS — Z7982 Long term (current) use of aspirin: Secondary | ICD-10-CM

## 2015-01-29 DIAGNOSIS — E785 Hyperlipidemia, unspecified: Secondary | ICD-10-CM | POA: Diagnosis present

## 2015-01-29 DIAGNOSIS — E119 Type 2 diabetes mellitus without complications: Secondary | ICD-10-CM | POA: Diagnosis present

## 2015-01-29 DIAGNOSIS — T82868A Thrombosis of vascular prosthetic devices, implants and grafts, initial encounter: Secondary | ICD-10-CM | POA: Diagnosis not present

## 2015-01-29 DIAGNOSIS — R200 Anesthesia of skin: Secondary | ICD-10-CM | POA: Diagnosis present

## 2015-01-29 DIAGNOSIS — I70213 Atherosclerosis of native arteries of extremities with intermittent claudication, bilateral legs: Principal | ICD-10-CM | POA: Diagnosis present

## 2015-01-29 DIAGNOSIS — Z9889 Other specified postprocedural states: Secondary | ICD-10-CM | POA: Diagnosis not present

## 2015-01-29 DIAGNOSIS — F17210 Nicotine dependence, cigarettes, uncomplicated: Secondary | ICD-10-CM | POA: Diagnosis present

## 2015-01-29 DIAGNOSIS — I739 Peripheral vascular disease, unspecified: Secondary | ICD-10-CM | POA: Diagnosis present

## 2015-01-29 DIAGNOSIS — I745 Embolism and thrombosis of iliac artery: Secondary | ICD-10-CM | POA: Diagnosis present

## 2015-01-29 DIAGNOSIS — Z9289 Personal history of other medical treatment: Secondary | ICD-10-CM | POA: Insufficient documentation

## 2015-01-29 DIAGNOSIS — Z419 Encounter for procedure for purposes other than remedying health state, unspecified: Secondary | ICD-10-CM

## 2015-01-29 HISTORY — DX: Anemia, unspecified: D64.9

## 2015-01-29 HISTORY — PX: PATCH ANGIOPLASTY: SHX6230

## 2015-01-29 HISTORY — PX: FEMORAL-FEMORAL BYPASS GRAFT: SHX936

## 2015-01-29 HISTORY — DX: Personal history of other medical treatment: Z92.89

## 2015-01-29 HISTORY — PX: INTRAOPERATIVE ARTERIOGRAM: SHX5157

## 2015-01-29 HISTORY — DX: Type 2 diabetes mellitus without complications: E11.9

## 2015-01-29 HISTORY — PX: ENDARTERECTOMY FEMORAL: SHX5804

## 2015-01-29 HISTORY — DX: Hyperlipidemia, unspecified: E78.5

## 2015-01-29 HISTORY — PX: THROMBECTOMY FEMORAL ARTERY: SHX6406

## 2015-01-29 LAB — GLUCOSE, CAPILLARY
Glucose-Capillary: 97 mg/dL (ref 65–99)
Glucose-Capillary: 97 mg/dL (ref 65–99)
Glucose-Capillary: 131 mg/dL — ABNORMAL HIGH (ref 65–99)

## 2015-01-29 LAB — ANTITHROMBIN III: ANTITHROMB III FUNC: 78 % (ref 75–120)

## 2015-01-29 SURGERY — CREATION, BYPASS, ARTERIAL, FEMORAL TO FEMORAL, USING GRAFT
Anesthesia: General | Laterality: Left

## 2015-01-29 MED ORDER — PHENYLEPHRINE 40 MCG/ML (10ML) SYRINGE FOR IV PUSH (FOR BLOOD PRESSURE SUPPORT)
PREFILLED_SYRINGE | INTRAVENOUS | Status: AC
  Filled 2015-01-29: qty 10

## 2015-01-29 MED ORDER — DIAZEPAM 5 MG PO TABS: 5.0000 mg | ORAL_TABLET | Freq: Four times a day (QID) | ORAL | Status: DC | PRN

## 2015-01-29 MED ORDER — ASPIRIN EC 81 MG PO TBEC
81.0000 mg | DELAYED_RELEASE_TABLET | Freq: Every day | ORAL | Status: DC
  Administered 2015-01-29 – 2015-01-31 (×3): 81 mg via ORAL
  Filled 2015-01-29 (×3): qty 1

## 2015-01-29 MED ORDER — LIDOCAINE HCL (CARDIAC) 20 MG/ML IV SOLN
INTRAVENOUS | Status: DC | PRN
  Administered 2015-01-29: 40 mg via INTRAVENOUS

## 2015-01-29 MED ORDER — EPHEDRINE SULFATE 50 MG/ML IJ SOLN
INTRAMUSCULAR | Status: DC | PRN
  Administered 2015-01-29 (×6): 5 mg via INTRAVENOUS

## 2015-01-29 MED ORDER — DEXAMETHASONE SODIUM PHOSPHATE 4 MG/ML IJ SOLN
INTRAMUSCULAR | Status: DC | PRN
  Administered 2015-01-29: 4 mg via INTRAVENOUS

## 2015-01-29 MED ORDER — PANTOPRAZOLE SODIUM 40 MG PO TBEC
40.0000 mg | DELAYED_RELEASE_TABLET | Freq: Every day | ORAL | Status: DC
  Administered 2015-01-29 – 2015-01-31 (×3): 40 mg via ORAL
  Filled 2015-01-29 (×3): qty 1

## 2015-01-29 MED ORDER — PROPOFOL 10 MG/ML IV BOLUS
INTRAVENOUS | Status: DC | PRN
  Administered 2015-01-29: 100 mg via INTRAVENOUS
  Administered 2015-01-29: 30 mg via INTRAVENOUS

## 2015-01-29 MED ORDER — LABETALOL HCL 5 MG/ML IV SOLN
10.0000 mg | INTRAVENOUS | Status: DC | PRN
  Filled 2015-01-29: qty 4

## 2015-01-29 MED ORDER — MAGNESIUM SULFATE 2 GM/50ML IV SOLN
2.0000 g | Freq: Every day | INTRAVENOUS | Status: DC | PRN
  Filled 2015-01-29: qty 50

## 2015-01-29 MED ORDER — FENTANYL CITRATE (PF) 100 MCG/2ML IJ SOLN
INTRAMUSCULAR | Status: DC | PRN
  Administered 2015-01-29 (×4): 50 ug via INTRAVENOUS
  Administered 2015-01-29: 100 ug via INTRAVENOUS
  Administered 2015-01-29 (×3): 50 ug via INTRAVENOUS

## 2015-01-29 MED ORDER — SODIUM CHLORIDE 0.9 % IV SOLN: 500.0000 mL | Freq: Once | INTRAVENOUS | Status: AC | PRN

## 2015-01-29 MED ORDER — SODIUM CHLORIDE 0.9 % IJ SOLN
INTRAMUSCULAR | Status: AC
  Filled 2015-01-29: qty 10

## 2015-01-29 MED ORDER — GLYCOPYRROLATE 0.2 MG/ML IJ SOLN
INTRAMUSCULAR | Status: AC
  Filled 2015-01-29: qty 2

## 2015-01-29 MED ORDER — BISACODYL 10 MG RE SUPP: 10.0000 mg | Freq: Every day | RECTAL | Status: DC | PRN

## 2015-01-29 MED ORDER — ALBUMIN HUMAN 5 % IV SOLN
INTRAVENOUS | Status: DC | PRN
  Administered 2015-01-29 (×2): via INTRAVENOUS

## 2015-01-29 MED ORDER — GLYCOPYRROLATE 0.2 MG/ML IJ SOLN
INTRAMUSCULAR | Status: AC
  Filled 2015-01-29: qty 1

## 2015-01-29 MED ORDER — HYDROMORPHONE HCL 1 MG/ML IJ SOLN: 0.2500 mg | INTRAMUSCULAR | Status: DC | PRN

## 2015-01-29 MED ORDER — MIDAZOLAM HCL 5 MG/5ML IJ SOLN
INTRAMUSCULAR | Status: DC | PRN
  Administered 2015-01-29: 2 mg via INTRAVENOUS

## 2015-01-29 MED ORDER — SODIUM CHLORIDE 0.9 % IV SOLN
INTRAVENOUS | Status: DC
  Administered 2015-01-29: 16:00:00 via INTRAVENOUS

## 2015-01-29 MED ORDER — NEOSTIGMINE METHYLSULFATE 10 MG/10ML IV SOLN
INTRAVENOUS | Status: AC
  Filled 2015-01-29: qty 1

## 2015-01-29 MED ORDER — HEPARIN SODIUM (PORCINE) 1000 UNIT/ML IJ SOLN
INTRAMUSCULAR | Status: AC
  Filled 2015-01-29: qty 1

## 2015-01-29 MED ORDER — MORPHINE SULFATE 2 MG/ML IJ SOLN: 2.0000 mg | INTRAMUSCULAR | Status: DC | PRN

## 2015-01-29 MED ORDER — GLYCOPYRROLATE 0.2 MG/ML IJ SOLN
INTRAMUSCULAR | Status: DC | PRN
  Administered 2015-01-29: 0.4 mg via INTRAVENOUS

## 2015-01-29 MED ORDER — ACETAMINOPHEN 650 MG RE SUPP
325.0000 mg | RECTAL | Status: DC | PRN
Start: 2015-01-29 — End: 2015-01-31

## 2015-01-29 MED ORDER — OXYCODONE-ACETAMINOPHEN 5-325 MG PO TABS
1.0000 | ORAL_TABLET | ORAL | Status: DC | PRN
  Administered 2015-01-30 (×2): 1 via ORAL
  Filled 2015-01-29 (×2): qty 1

## 2015-01-29 MED ORDER — LIDOCAINE HCL (CARDIAC) 20 MG/ML IV SOLN
INTRAVENOUS | Status: AC
  Filled 2015-01-29: qty 5

## 2015-01-29 MED ORDER — LISINOPRIL 20 MG PO TABS
20.0000 mg | ORAL_TABLET | Freq: Every day | ORAL | Status: DC
  Administered 2015-01-29 – 2015-01-31 (×3): 20 mg via ORAL
  Filled 2015-01-29 (×4): qty 1

## 2015-01-29 MED ORDER — HEPARIN (PORCINE) IN NACL 100-0.45 UNIT/ML-% IJ SOLN
1600.0000 [IU]/h | INTRAMUSCULAR | Status: DC
  Administered 2015-01-29: 1200 [IU]/h via INTRAVENOUS
  Administered 2015-01-30: 1450 [IU]/h via INTRAVENOUS
  Filled 2015-01-29 (×3): qty 250

## 2015-01-29 MED ORDER — INSULIN ASPART 100 UNIT/ML ~~LOC~~ SOLN
0.0000 [IU] | Freq: Three times a day (TID) | SUBCUTANEOUS | Status: DC
  Administered 2015-01-30: 1 [IU] via SUBCUTANEOUS

## 2015-01-29 MED ORDER — SUCCINYLCHOLINE CHLORIDE 20 MG/ML IJ SOLN
INTRAMUSCULAR | Status: AC
  Filled 2015-01-29: qty 1

## 2015-01-29 MED ORDER — PHENOL 1.4 % MT LIQD
1.0000 | OROMUCOSAL | Status: DC | PRN
  Administered 2015-01-31: 1 via OROMUCOSAL
  Filled 2015-01-29: qty 177

## 2015-01-29 MED ORDER — MIDAZOLAM HCL 2 MG/2ML IJ SOLN
INTRAMUSCULAR | Status: AC
  Filled 2015-01-29: qty 2

## 2015-01-29 MED ORDER — ACETAMINOPHEN 325 MG PO TABS: 325.0000 mg | ORAL_TABLET | ORAL | Status: DC | PRN

## 2015-01-29 MED ORDER — NEOSTIGMINE METHYLSULFATE 10 MG/10ML IV SOLN
INTRAVENOUS | Status: DC | PRN
  Administered 2015-01-29: 3 mg via INTRAVENOUS

## 2015-01-29 MED ORDER — LACTATED RINGERS IV SOLN
INTRAVENOUS | Status: DC | PRN
  Administered 2015-01-29 (×3): via INTRAVENOUS

## 2015-01-29 MED ORDER — ROCURONIUM BROMIDE 50 MG/5ML IV SOLN
INTRAVENOUS | Status: AC
  Filled 2015-01-29: qty 1

## 2015-01-29 MED ORDER — ATENOLOL 25 MG PO TABS
25.0000 mg | ORAL_TABLET | Freq: Every day | ORAL | Status: DC
  Administered 2015-01-30 – 2015-01-31 (×2): 25 mg via ORAL
  Filled 2015-01-29 (×2): qty 1

## 2015-01-29 MED ORDER — PROPOFOL 10 MG/ML IV BOLUS
INTRAVENOUS | Status: AC
  Filled 2015-01-29: qty 20

## 2015-01-29 MED ORDER — DEXTROSE 5 % IV SOLN
1.5000 g | INTRAVENOUS | Status: DC
  Filled 2015-01-29: qty 1.5

## 2015-01-29 MED ORDER — SENNOSIDES-DOCUSATE SODIUM 8.6-50 MG PO TABS
1.0000 | ORAL_TABLET | Freq: Every evening | ORAL | Status: DC | PRN
Start: 2015-01-29 — End: 2015-01-31
  Administered 2015-01-30: 1 via ORAL
  Filled 2015-01-29 (×3): qty 1

## 2015-01-29 MED ORDER — ALUM & MAG HYDROXIDE-SIMETH 200-200-20 MG/5ML PO SUSP: 15.0000 mL | ORAL | Status: DC | PRN

## 2015-01-29 MED ORDER — GUAIFENESIN-DM 100-10 MG/5ML PO SYRP
15.0000 mL | ORAL_SOLUTION | ORAL | Status: DC | PRN
  Administered 2015-01-31: 15 mL via ORAL
  Filled 2015-01-29: qty 15

## 2015-01-29 MED ORDER — IOHEXOL 300 MG/ML  SOLN
INTRAMUSCULAR | Status: DC | PRN
  Administered 2015-01-29: 50 mL via INTRAVENOUS

## 2015-01-29 MED ORDER — ENOXAPARIN SODIUM 30 MG/0.3ML ~~LOC~~ SOLN
30.0000 mg | SUBCUTANEOUS | Status: DC
  Filled 2015-01-29: qty 0.3

## 2015-01-29 MED ORDER — 0.9 % SODIUM CHLORIDE (POUR BTL) OPTIME
TOPICAL | Status: DC | PRN
  Administered 2015-01-29: 2000 mL

## 2015-01-29 MED ORDER — ROCURONIUM BROMIDE 100 MG/10ML IV SOLN
INTRAVENOUS | Status: DC | PRN
  Administered 2015-01-29: 50 mg via INTRAVENOUS

## 2015-01-29 MED ORDER — EPHEDRINE SULFATE 50 MG/ML IJ SOLN
INTRAMUSCULAR | Status: AC
  Filled 2015-01-29: qty 1

## 2015-01-29 MED ORDER — DEXAMETHASONE SODIUM PHOSPHATE 4 MG/ML IJ SOLN
INTRAMUSCULAR | Status: AC
  Filled 2015-01-29: qty 1

## 2015-01-29 MED ORDER — HEPARIN SODIUM (PORCINE) 1000 UNIT/ML IJ SOLN
INTRAMUSCULAR | Status: DC | PRN
  Administered 2015-01-29: 2000 [IU] via INTRAVENOUS
  Administered 2015-01-29 (×2): 7000 [IU] via INTRAVENOUS

## 2015-01-29 MED ORDER — ONDANSETRON HCL 4 MG/2ML IJ SOLN
INTRAMUSCULAR | Status: DC | PRN
  Administered 2015-01-29: 4 mg via INTRAVENOUS

## 2015-01-29 MED ORDER — PHENYLEPHRINE HCL 10 MG/ML IJ SOLN
10.0000 mg | INTRAMUSCULAR | Status: DC | PRN
  Administered 2015-01-29: 20 ug/min via INTRAVENOUS
  Administered 2015-01-29: 12:00:00 via INTRAVENOUS

## 2015-01-29 MED ORDER — FENTANYL CITRATE (PF) 250 MCG/5ML IJ SOLN
INTRAMUSCULAR | Status: AC
  Filled 2015-01-29: qty 5

## 2015-01-29 MED ORDER — PROTAMINE SULFATE 10 MG/ML IV SOLN
INTRAVENOUS | Status: DC | PRN
  Administered 2015-01-29: 10 mg via INTRAVENOUS
  Administered 2015-01-29 (×4): 5 mg via INTRAVENOUS

## 2015-01-29 MED ORDER — ONDANSETRON HCL 4 MG/2ML IJ SOLN: 4.0000 mg | Freq: Four times a day (QID) | INTRAMUSCULAR | Status: DC | PRN

## 2015-01-29 MED ORDER — DEXTROSE 5 % IV SOLN
1.5000 g | Freq: Two times a day (BID) | INTRAVENOUS | Status: AC
  Administered 2015-01-29 – 2015-01-30 (×2): 1.5 g via INTRAVENOUS
  Filled 2015-01-29 (×2): qty 1.5

## 2015-01-29 MED ORDER — SURGIFOAM 100 EX MISC
CUTANEOUS | Status: DC | PRN
  Administered 2015-01-29 (×2): via TOPICAL

## 2015-01-29 MED ORDER — PROTAMINE SULFATE 10 MG/ML IV SOLN
INTRAVENOUS | Status: AC
  Filled 2015-01-29: qty 5

## 2015-01-29 MED ORDER — ONDANSETRON HCL 4 MG/2ML IJ SOLN
INTRAMUSCULAR | Status: AC
  Filled 2015-01-29: qty 2

## 2015-01-29 MED ORDER — ROSUVASTATIN CALCIUM 10 MG PO TABS
10.0000 mg | ORAL_TABLET | Freq: Every day | ORAL | Status: DC
  Administered 2015-01-29 – 2015-01-31 (×3): 10 mg via ORAL
  Filled 2015-01-29 (×3): qty 1

## 2015-01-29 MED ORDER — SODIUM CHLORIDE 0.9 % IR SOLN
Status: DC | PRN
  Administered 2015-01-29: 09:00:00

## 2015-01-29 MED ORDER — THROMBIN 20000 UNITS EX SOLR
CUTANEOUS | Status: AC
  Filled 2015-01-29: qty 40000

## 2015-01-29 MED ORDER — HYDRALAZINE HCL 20 MG/ML IJ SOLN: 5.0000 mg | INTRAMUSCULAR | Status: DC | PRN

## 2015-01-29 MED ORDER — GABAPENTIN 300 MG PO CAPS
300.0000 mg | ORAL_CAPSULE | Freq: Three times a day (TID) | ORAL | Status: DC
  Administered 2015-01-29 – 2015-01-31 (×6): 300 mg via ORAL
  Filled 2015-01-29 (×8): qty 1

## 2015-01-29 MED ORDER — DOCUSATE SODIUM 100 MG PO CAPS
100.0000 mg | ORAL_CAPSULE | Freq: Every day | ORAL | Status: DC
  Administered 2015-01-30 – 2015-01-31 (×2): 100 mg via ORAL
  Filled 2015-01-29: qty 1

## 2015-01-29 MED ORDER — METOPROLOL TARTRATE 1 MG/ML IV SOLN: 2.0000 mg | INTRAVENOUS | Status: DC | PRN

## 2015-01-29 MED ORDER — POTASSIUM CHLORIDE CRYS ER 20 MEQ PO TBCR: 20.0000 meq | EXTENDED_RELEASE_TABLET | Freq: Every day | ORAL | Status: DC | PRN

## 2015-01-29 SURGICAL SUPPLY — 63 items
CANISTER SUCTION 2500CC (MISCELLANEOUS) ×4 IMPLANT
CATH ACCU-VU SIZ PIG 5F 70CM (CATHETERS) IMPLANT
CATH EMB 3FR 80CM (CATHETERS) ×2 IMPLANT
CATH EMB 4FR 80CM (CATHETERS) ×2 IMPLANT
CLIP TI MEDIUM 24 (CLIP) ×4 IMPLANT
CLIP TI WIDE RED SMALL 24 (CLIP) ×4 IMPLANT
DRAIN CHANNEL 15F RND FF W/TCR (WOUND CARE) IMPLANT
DRAPE C-ARM 42X72 X-RAY (DRAPES) ×2 IMPLANT
ELECT REM PT RETURN 9FT ADLT (ELECTROSURGICAL) ×4
ELECTRODE REM PT RTRN 9FT ADLT (ELECTROSURGICAL) ×2 IMPLANT
EVACUATOR SILICONE 100CC (DRAIN) IMPLANT
GAUZE SPONGE 4X4 16PLY XRAY LF (GAUZE/BANDAGES/DRESSINGS) ×2 IMPLANT
GLOVE BIO SURGEON STRL SZ7 (GLOVE) ×8 IMPLANT
GLOVE BIO SURGEON STRL SZ7.5 (GLOVE) ×2 IMPLANT
GLOVE BIOGEL PI IND STRL 6.5 (GLOVE) IMPLANT
GLOVE BIOGEL PI IND STRL 7.0 (GLOVE) IMPLANT
GLOVE BIOGEL PI IND STRL 7.5 (GLOVE) ×2 IMPLANT
GLOVE BIOGEL PI IND STRL 8 (GLOVE) IMPLANT
GLOVE BIOGEL PI INDICATOR 6.5 (GLOVE) ×2
GLOVE BIOGEL PI INDICATOR 7.0 (GLOVE) ×2
GLOVE BIOGEL PI INDICATOR 7.5 (GLOVE) ×12
GLOVE BIOGEL PI INDICATOR 8 (GLOVE) ×2
GLOVE ECLIPSE 6.5 STRL STRAW (GLOVE) ×4 IMPLANT
GLOVE ECLIPSE 7.0 STRL STRAW (GLOVE) ×4 IMPLANT
GLOVE SURG SS PI 7.0 STRL IVOR (GLOVE) ×2 IMPLANT
GLOVE SURG SS PI 7.5 STRL IVOR (GLOVE) ×2 IMPLANT
GOWN STRL REUS W/ TWL LRG LVL3 (GOWN DISPOSABLE) ×6 IMPLANT
GOWN STRL REUS W/ TWL XL LVL3 (GOWN DISPOSABLE) IMPLANT
GOWN STRL REUS W/TWL LRG LVL3 (GOWN DISPOSABLE) ×28
GOWN STRL REUS W/TWL XL LVL3 (GOWN DISPOSABLE) ×12
GRAFT HEMASHIELD 8MM (Vascular Products) ×4 IMPLANT
GRAFT PROPATEN W/RING 8X80X70 (Vascular Products) ×2 IMPLANT
GRAFT VASC STRG 30X8KNIT (Vascular Products) IMPLANT
INSERT FOGARTY SM (MISCELLANEOUS) ×6 IMPLANT
KIT BASIN OR (CUSTOM PROCEDURE TRAY) ×4 IMPLANT
KIT ROOM TURNOVER OR (KITS) ×4 IMPLANT
LIQUID BAND (GAUZE/BANDAGES/DRESSINGS) ×4 IMPLANT
LOOP VESSEL MINI RED (MISCELLANEOUS) ×4 IMPLANT
NS IRRIG 1000ML POUR BTL (IV SOLUTION) ×10 IMPLANT
PACK PERIPHERAL VASCULAR (CUSTOM PROCEDURE TRAY) ×4 IMPLANT
PAD ARMBOARD 7.5X6 YLW CONV (MISCELLANEOUS) ×8 IMPLANT
PATCH VASCULAR VASCU GUARD 1X6 (Vascular Products) ×2 IMPLANT
SET MICROPUNCTURE 5F STIFF (MISCELLANEOUS) ×2 IMPLANT
SHEATH AVANTI 11CM 5FR (MISCELLANEOUS) IMPLANT
SPONGE SURGIFOAM ABS GEL 100 (HEMOSTASIS) ×4 IMPLANT
STAPLER VISISTAT 35W (STAPLE) IMPLANT
STOPCOCK 4 WAY LG BORE MALE ST (IV SETS) ×2 IMPLANT
SUT GORETEX 5 0 TT13 24 (SUTURE) ×4 IMPLANT
SUT MNCRL AB 4-0 PS2 18 (SUTURE) ×8 IMPLANT
SUT PROLENE 5 0 C 1 24 (SUTURE) ×12 IMPLANT
SUT PROLENE 6 0 BV (SUTURE) ×4 IMPLANT
SUT PROLENE 7 0 BV1 MDA (SUTURE) ×2 IMPLANT
SUT SILK 2 0 FS (SUTURE) ×2 IMPLANT
SUT VIC AB 2-0 CT1 27 (SUTURE) ×12
SUT VIC AB 2-0 CT1 TAPERPNT 27 (SUTURE) ×4 IMPLANT
SUT VIC AB 3-0 SH 27 (SUTURE) ×8
SUT VIC AB 3-0 SH 27X BRD (SUTURE) ×4 IMPLANT
SYR 30ML LL (SYRINGE) ×4 IMPLANT
SYRINGE 3CC LL L/F (MISCELLANEOUS) ×2 IMPLANT
TRAY FOLEY W/METER SILVER 16FR (SET/KITS/TRAYS/PACK) ×2 IMPLANT
TUBING EXTENTION W/L.L. (IV SETS) ×2 IMPLANT
UNDERPAD 30X30 INCONTINENT (UNDERPADS AND DIAPERS) ×4 IMPLANT
WATER STERILE IRR 1000ML POUR (IV SOLUTION) ×4 IMPLANT

## 2015-01-29 NOTE — Anesthesia Preprocedure Evaluation (Addendum)
Anesthesia Evaluation  Patient identified by MRN, date of birth, ID band Patient awake    Reviewed: Allergy & Precautions, H&P , NPO status , Patient's Chart, lab work & pertinent test results, reviewed documented beta blocker date and time   History of Anesthesia Complications Negative for: history of anesthetic complications  Airway Mallampati: III  TM Distance: >3 FB Neck ROM: Full    Dental no notable dental hx. (+) Edentulous Upper, Partial Lower, Dental Advisory Given   Pulmonary Current Smoker,  breath sounds clear to auscultation  Pulmonary exam normal       Cardiovascular hypertension, Pt. on medications and Pt. on home beta blockers + CAD, + Cardiac Stents and + Peripheral Vascular Disease Rhythm:Regular Rate:Normal     Neuro/Psych negative neurological ROS  negative psych ROS   GI/Hepatic negative GI ROS, Neg liver ROS,   Endo/Other  diabetes, Type 2, Oral Hypoglycemic AgentsGlucose = 97  Renal/GU negative Renal ROS  negative genitourinary   Musculoskeletal negative musculoskeletal ROS (+)   Abdominal   Peds  Hematology negative hematology ROS (+)   Anesthesia Other Findings   Reproductive/Obstetrics negative OB ROS                           Anesthesia Physical Anesthesia Plan  ASA: III  Anesthesia Plan: General   Post-op Pain Management:    Induction: Intravenous  Airway Management Planned: Oral ETT  Additional Equipment:   Intra-op Plan:   Post-operative Plan: Extubation in OR  Informed Consent: I have reviewed the patients History and Physical, chart, labs and discussed the procedure including the risks, benefits and alternatives for the proposed anesthesia with the patient or authorized representative who has indicated his/her understanding and acceptance.   Dental advisory given  Plan Discussed with: CRNA  Anesthesia Plan Comments:         Anesthesia  Quick Evaluation

## 2015-01-29 NOTE — Progress Notes (Signed)
ANTICOAGULATION CONSULT NOTE - Initial Consult  Pharmacy Consult for Heparin Indication: VTE prophylaxis  No Known Allergies  Patient Measurements: Height: 6\' 1"  (185.4 cm) Weight: 185 lb (83.915 kg) IBW/kg (Calculated) : 79.9 Heparin Dosing Weight: 83.9 kg  Vital Signs: Temp: 97.4 F (36.3 C) (05/31 1745) Temp Source: Oral (05/31 1745) BP: 123/64 mmHg (05/31 1805) Pulse Rate: 68 (05/31 1805)  Labs:  pre-op labs from 01/21/15.    Hgb 12.3, platelet count 323  PT 14.3 seconds, INR 1.09. aPTT 34 seconds  Creatinine 0.92  Estimated Creatinine Clearance: 95.3 mL/min (by C-G formula based on Cr of 0.92).   Medical History: Past Medical History  Diagnosis Date  . CAD (coronary artery disease)     Bare-metal stent, LAD, 2007, total RCA, right to right collaterals  . Ejection fraction < 50%     EF 40% in the past  . Dyslipidemia   . Tobacco abuse   . Hypertension   . PAD (peripheral artery disease)     Arterial leg Doppler, December 29, 2011, greater than 50% focal stenosis of the left mid S.FA, right ABI mild range left ABI moderate range  //   consultation by Dr. Angelena Form, Jan 19, 2012. CTA with severe right CIA stenosis, bil SFA.   . Diabetes mellitus without complication     Type 2   Assessment:   S/p vascular procedure at multiple sites, including thrombectomy of femorofemoral graft. To begin IV heparin at 8pm, about 6 hours post-op.  Received heparin and protamine during procedure. EBL 500 ml.  Now on the floor, no bleeding per patient.  Goal of Therapy:  Heparin level 0.3-0.7 units/ml Monitor platelets by anticoagulation protocol: Yes   Plan:   Will begin heparin drip at 1200 units/hr (~ 14 units/kg/hr)  Heparin level ~ 6 hrs after drip begins.  Daily heparin level and CBC while on heparin.  Arty Baumgartner, [place] Pager: [phone] 01/29/2015,7:05 PM

## 2015-01-29 NOTE — Op Note (Signed)
OPERATIVE NOTE   PROCEDURE: 1. Right common femoral artery endarterectomy 2. Left-to-right femorofemoral bypass with Dacron 3. Open cannulation of left common femoral artery  4. Left iliac angiogram 5. Thrombectomy of femorofemoral graft 6. Left iliofemoral endarterectomy with bovine patch angioplasty 7. 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 Annamarie Major, Gerri Lins, [facility]   ANESTHESIA: general  ESTIMATED BLOOD LOSS: 500 cc  FINDING(S): 1.  Weak left external iliac artery pulse: improved after iliofemoral endarterectomy 2.  No focal stenosis in left iliac arterial system amendable to stenting 3.  Thrombosed Dacron femorofemoral graft  4.  Augmented right common femoral artery blood flow with open femorofemoral graft: biphasic signals present common femoral artery   SPECIMEN(S):  none  INDICATIONS:   Austin Espinoza is a 59 y.o. male who presents with chronically occluded right external iliac artery on angiogram and intermittent right leg rest pain.  He did not have hemodynamically significant gradient across the left iliac arterial system, so I felt a left-to-right femorofemoral bypass was indicated.  The risk, benefits, and alternative for bypass operations were discussed with the patient.  The patient is aware the risks include but are not limited to: bleeding, infection, myocardial infarction, stroke, limb loss, nerve damage, need for additional procedures in the future, wound complications, and inability to complete the bypass.  The patient is aware of these risks and agreed to proceed.  DESCRIPTION: After obtaining full informed written consent, the patient was brought back to the operating room and placed supine upon the operating table.  The patient received IV  antibiotics prior to induction.  After obtaining adequate anesthesia, the patient was prepped and draped in the standard fashion for: femorofemoral bypass.  Under [employer] guidance, I identified bilateral common femoral arteries and marked them on the skin.  I made an incision over the left common femoral artery.  I dissected out the left common femoral artery from the inguinal ligament to the femoral bifurcation.  The superficial femoral artery, profunda femoral artery, and circumflex branches were controlled with vessel loops.  This artery did not felt calficied but had obvious atherosclerotic plaque.  The distal external iliac artery pulse was weaker than expected based on the prior angiogram.   I turned my attention to the right groin.  I made an incision over the right common femoral artery.  I dissected out the right common femoral artery from the inguinal ligament to the femoral bifurcation.  The superficial femoral artery, profunda femoral artery, and circumflex branches were controlled with vessel loops.  This artery did not felt calficied but had more atherosclerotic plaque than the left side.    I then dissected a subcutaneous tunnel from each groin in the suprapubic position.  I passed a ring dissector in this suprapubic tunnel and pulled a 8 mm Dacron graft through the tunnel, maintaining orientation of the graft.  The patient was given 7000 units of Heparin intravenously, which was a therapeutic bolus.  I clamped the left external iliac artery and placed the circumflex branches, superficial femoral artery and profunda femoral artery under tension.  I made an arteriotomy and extended it proximally and distally.  There was a medial plaque which appeared to occupy ~1/3 of the lumen.  I felt this would not need an endarterectomy.  I spatulated one end of the graft to meet  the dimensions of this arteriotomy.  The graft was sewn to the arteriotomy in an end-to-side fashion with a running stitch of 5-0  Prolene.  I clamped the graft near the anastomosis and released the other clamp and vessel loops.  I pulled the graft to tension in the suprapubic tunnel.  I then clamped the right distal external iliac artery and placed the circumflex branches, superficial femoral artery and profunda femoral artery under tension.  I made an arteriotomy and extended it proximally and distally.  After doing such, I found a plaque occupying >75% of the lumen.  I felt an endarterectomy was going to be needed.  I completed a limited endarterectomy in the common femoral artery, transecting the plaque proximal to the femoral bifurcation.  I extended the endarterectomy into the proximal common femoral artery.  I tacked down the plaque distally with 7-0 Prolene stitches.  I spatulated the right end of the graft to the dimension of this arteriotomy.  I sewed the graft to the artery with a running stitch of 5-0 Prolene.  All clamps and vessel loops were released.    Initially there was improved right common femoral artery flow signatures that were verified by compression of the graft.  However, there was no pulse in the graft, so I felt that intervention on the left iliac system might be needed to improve the inflow to the femorofemoral graft.  I cannulated the left common femoral artery proximal to the anastomosis with a micropuncture needle.  The wire was loaded and the needle exchanged for a microsheath.  I connected the sheath to the IV tubing and hand injection was completed to image the left iliac arterial system.  This did not demonstrate any lesions that would easily respond to stenting or angioplasty, rather the whole arterial system appeared to be smaller than usual, ~4-5 mm.  The femorofemoral graft did NOT appear on this injection so I had concerns that it had thrombosed.  I sewed a Z-stitch around the sheath and removed the sheath while tying down the stitch.    I reclamped the graft left distal external iliac artery.  The  superficial femoral artery and profunda femoral artery were placed under tension again.  I sharply release a portion of the suture line on left anastomosis.  There was no obvious thrombus and excellent backbleeding from the graft.  The flow from the distal left external iliac artery was unchanged.  I sewed another 5-0 Prolene to the anastomosis and retied the suture line again.  All clamps were released.    I felt no immediate intervention was likely to help the long-term patency of this graft, so I start closing both groins after reversing anticoagulation with Protamine.  By the time, I started on the right groin, the graft appeared flat, making me concerned it was clotted.  I rebolused the patient 7000 units of Heparin.  After waiting 3 minutes, I reclamped the left external iliac artery and placed the superficial femoral artery and profunda femoral artery under tension.  I completely took the left anastomosis off the artery.  There was thrombus in the graft and a limited amount in the common femoral artery.  I passed a 4 Fogarty through the graft, extracting acute thrombus.   Unfortunately, I suspect that her inflow was inadequate for this Dacron graft.  As a precaution, I passed a 3 Fogarty down the profunda femoral artery and superficial femoral artery, obtaining no clot.  I then dissected the left external iliac artery  under the inguinal ligament until I had a soft segment.  I clamped the artery here.  I then extended the arteriotomy proximally and distally for an endarterectomy.  I started the endarterectomy in the mid-segment and terminated it proximal to the femoral bifurcation.  I then carried the endarterectomy up into the distal external iliac artery.  The proximal plaque tapered well.  Distally the plaque was densely adherent so no tacking stitches were needed.  I sewed a bovine patch onto the left common femoral artery with a running stitch of 6-0 Prolene.    I then turned my attention to the right  groin.  I reclamped the distal external iliac artery and placed the superficial femoral artery and profunda femoral artery under tension.  I sharply took the graft of the artery.  There was thrombus adherent to the graft.  I elected to completely removed this segment of graft and switch to Propaten.  I backbled the superficial femoral artery and profunda femoral artery and no thrombus was present.  I also passed a 3 Fogarty distally with no thrombus obtained.  I placed the ring forcep through the suprapubic tunnel and then passed a 8 mm ringed Propaten graft through the tunnel.  I spatulated the right end of this graft to the dimension of the right arteriotomy.  The graft was sewn to the artery with a running stitch of CV-5.  I left the suture line loose.  I then turned my attention to the right groin.  I pulled the graft to tension in the tunnel and transected the graft to appropriate length.  I pulled off the extra rings and then spatulated the left side of the graft.  I made an incision in the patch and extended proximally and distally with a Potts scissor to meet the dimension of this graft.  I sewed the graft to the bovine patched common femoral artery with a running stitch of CV-5.  I pulled up on the suture line with a nerve hook.  I unclamped the left external iliac artery and released the vessel loops.  There was much improved pulse in this left common femoral artery.  There was also a reasonable pulse in the femorofemoral graft proximally.    I turned my attention to the right side.  There was a weak pulse in this side of the graft.  I pulled up on the suture line and allow the graft to bleed by releasing the clamp.  There was improved blood flow in this graft.  I backbled the superficial femoral artery and profunda femoral artery.  No clot was present.  I tied off this suture line.  Thrombin and gelfoam was placed into both groins.  After a few minutes, no further bleeding was present.  Each groin was  repaired with interrupted 2-0 Vicryl immediate adjacent to the anastomosis.  The subcutaneous tissue in each groin overlying the graft was reapproximated with a double layer of 3-0 Vicryl.  The skin in each groin was reapproximated with a subcuticular stitch of 4-0 Monocryl.  The skin was cleaned, dried, and reinforced with Dermabond.  COMPLICATIONS: on table thrombosis of femorofemoral graft  CONDITION: stable   Adele Barthel, MD [facility] Office: [phone] Pager: [phone]  01/29/2015, 1:02 PM

## 2015-01-29 NOTE — Anesthesia Procedure Notes (Signed)
Procedure Name: Intubation Date/Time: 01/29/2015 7:40 AM Performed by: Jenne Campus Pre-anesthesia Checklist: Patient identified, Emergency Drugs available, Suction available, Patient being monitored and Timeout performed Patient Re-evaluated:Patient Re-evaluated prior to inductionOxygen Delivery Method: Circle system utilized Preoxygenation: Pre-oxygenation with 100% oxygen Intubation Type: IV induction Ventilation: Mask ventilation without difficulty and Oral airway inserted - appropriate to patient size Laryngoscope Size: Miller and 3 Grade View: Grade I Tube type: Oral Tube size: 7.5 mm Number of attempts: 1 Airway Equipment and Method: Stylet Placement Confirmation: ETT inserted through vocal cords under direct vision,  positive ETCO2,  CO2 detector and breath sounds checked- equal and bilateral Secured at: 22 cm Tube secured with: Tape Dental Injury: Teeth and Oropharynx as per pre-operative assessment

## 2015-01-29 NOTE — Progress Notes (Signed)
Unable to get Doppler DP or Pt pulses on left foot. Left foot is cooler than right. Doppler DP & PT pulses pres. On right foot.  Lennie Muckle PA updated-she will be over to see pt.

## 2015-01-29 NOTE — Interval H&P Note (Signed)
[facility]  History and Physical Update  The patient was interviewed and re-examined.  The patient's previous History and Physical has been reviewed and is unchanged from consult except for: interval angiogram demonstrating chronic right external iliac artery occlusion.  There is no change in the plan of care: left-to-right femorofemoral bypass.  Adele Barthel, MD [facility] Office: [phone] Pager: [phone]  01/29/2015, 7:09 AM

## 2015-01-29 NOTE — Progress Notes (Signed)
       Patient alert Doppler right DP/PT biphasic, no doppler signals on left. Active range of motion of left foot and toes intact Incisions clean and dry  S/P Femoral to femoral artery bypass   COLLINS, EMMA MAUREEN PA-C

## 2015-01-29 NOTE — H&P (View-Only) (Signed)
Referred by:  Dr. Celedonio Miyamoto  Reason for referral: BLE PAD  History of Present Illness  Austin Espinoza is a 59 y.o. (01/20/1956) male who presents with chief complaint: right > left leg numbness.  Onset of symptom occurred >1 year ago, without obvious trigger.  He notes chronic paraesthesia with intermittent frank anesthesia in R>L.   He has been told previous he has possible herniated disc in his back.  The patient notes short distance intermittent claudication along with intermittent rest pain which wakes him up.  Pain is described as cramping, severity 1-10/10, and associated with ambulation and sleeping.  Patient has attempted to treat this pain with rest.  The patient denies leg wounds/ulcers.  Atherosclerotic risk factors include: borderline DM, HTN, HLD, and active smoking.  Past Medical History  Diagnosis Date  . CAD (coronary artery disease)     Bare-metal stent, LAD, 2007, total RCA, right to right collaterals  . Ejection fraction < 50%     EF 40% in the past  . Dyslipidemia   . Tobacco abuse   . Hypertension   . PAD (peripheral artery disease)     Arterial leg Doppler, December 29, 2011, greater than 50% focal stenosis of the left mid S.FA, right ABI mild range left ABI moderate range  //   consultation by Dr. Angelena Form, Jan 19, 2012. CTA with severe right CIA stenosis, bil SFA.   Marland Kitchen Hyperlipidemia     Past Surgical History  Procedure Laterality Date  . None    . Abdominal aortagram N/A 04/06/2012    Procedure: ABDOMINAL Maxcine Ham;  Surgeon: Burnell Blanks, MD;  Location: [facility] CATH LAB;  Service: Cardiovascular;  Laterality: N/A;    History   Social History  . Marital Status: Widowed    Spouse Name: N/A  . Number of Children: 0  . Years of Education: N/A   Occupational History  . Works at [employer]  . Smoking status: Current Every Day Smoker -- 0.50 packs/day for 40 years    Types: Cigarettes  . Smokeless tobacco: Never Used    . Alcohol Use: No  . Drug Use: No  . Sexual Activity: Not on file   Other Topics Concern  . Not on file   Social History Narrative    Family History  Problem Relation Age of Onset  . Heart attack Father   . Heart disease Father   . Diabetes Mother   . Cancer Brother   . Hypertension Sister    Current Outpatient Prescriptions on File Prior to Visit  Medication Sig Dispense Refill  . aspirin 81 MG tablet Take 1 tablet (81 mg total) by mouth daily. 30 tablet 11  . atenolol (TENORMIN) 25 MG tablet TAKE ONE TABLET BY MOUTH ONCE DAILY 30 tablet 0  . Cholecalciferol (VITAMIN D) 2000 UNITS CAPS Take by mouth.    . diazepam (VALIUM) 5 MG tablet Take 5 mg by mouth every 6 (six) hours as needed for anxiety.    . gabapentin (NEURONTIN) 300 MG capsule Take 300 mg by mouth 3 (three) times daily.    Marland Kitchen lisinopril (PRINIVIL,ZESTRIL) 20 MG tablet TAKE ONE TABLET BY MOUTH ONCE DAILY. NEEDS  APPOINTMENT  FOR  FURTHER  REFILLS. 7 tablet 0  . METFORMIN HCL PO Take 500 mg by mouth 2 (two) times daily.    . rosuvastatin (CRESTOR) 10 MG tablet Take 10 mg by mouth daily.    Marland Kitchen triamcinolone lotion (  KENALOG) 0.1 % Apply 1 application topically 2 (two) times daily.    . enalapril (VASOTEC) 10 MG tablet TAKE ONE TABLET BY MOUTH EVERY DAY (Patient not taking: Reported on 01/04/2015) 90 tablet 3  . Linaclotide (LINZESS) 145 MCG CAPS capsule Take 145 mcg by mouth daily.    Marland Kitchen lovastatin (MEVACOR) 10 MG tablet Take 4 tablets (40 mg total) by mouth at bedtime. (Patient not taking: Reported on 01/04/2015) 90 tablet 3  . varenicline (CHANTIX PAK) 0.5 MG X 11 & 1 MG X 42 tablet Take 0.5 mg by mouth 2 (two) times daily. Take one 0.5 mg tablet by mouth once daily for 3 days, then increase to one 0.5 mg tablet twice daily for 4 days, then increase to one 1 mg tablet twice daily.     No current facility-administered medications on file prior to visit.    No Known Allergies   REVIEW OF SYSTEMS:  (Positives checked  otherwise negative)  CARDIOVASCULAR:  [x]  chest pain, []  chest pressure, []  palpitations, []  shortness of breath when laying flat, []  shortness of breath with exertion,  [x]  pain in feet when walking, [x]  pain in feet when laying flat, []  history of blood clot in veins (DVT), []  history of phlebitis, []  swelling in legs, []  varicose veins  PULMONARY:  []  productive cough, []  asthma, []  wheezing  NEUROLOGIC:  [x]  weakness in arms or legs, [x]  numbness in arms or legs, []  difficulty speaking or slurred speech, []  temporary loss of vision in one eye, []  dizziness  HEMATOLOGIC:  []  bleeding problems, []  problems with blood clotting too easily  MUSCULOSKEL:  []  joint pain, []  joint swelling  GASTROINTEST:  []  vomiting blood, []  blood in stool     GENITOURINARY:  []  burning with urination, []  blood in urine  PSYCHIATRIC:  []  history of major depression  INTEGUMENTARY:  []  rashes, []  ulcers  CONSTITUTIONAL:  []  fever, []  chills   For VQI Use Only  PRE-ADM LIVING: Home  AMB STATUS: Ambulatory  CAD Sx: None  PRIOR CHF: None  STRESS TEST: [x]  No, [ ]  Normal, [ ]  + ischemia, [ ]  + MI, [ ]  Both   Physical Examination  Filed Vitals:   01/04/15 1111 01/04/15 1115  BP: 187/87 184/83  Pulse: 76   Height: 5\' 8"  (1.727 m)   Weight: 188 lb 9.6 oz (85.548 kg)   SpO2: 99%    Body mass index is 28.68 kg/(m^2).  General: A&O x 3, WDWN  Head: [place]/AT  Ear/Nose/Throat: Hearing grossly intact, nares w/o erythema or drainage, oropharynx w/o Erythema/Exudate, Mallampati score: 3  Eyes: PERRLA, EOMI  Neck: Supple, no nuchal rigidity, no palpable LAD  Pulmonary: Sym exp, good air movt, CTAB, no rales, rhonchi, & wheezing  Cardiac: RRR, Nl S1, S2, no Murmurs, rubs or gallops  Vascular: Vessel Right Left  Radial Palpable Palpable  Ulnar Not Palpable Not Palpable  Brachial Palpable Palpable  Carotid Palpable, without bruit Palpable, without bruit  Aorta Not palpable N/A  Femoral  Palpable Palpable  Popliteal Not palpable Not palpable  PT Not Palpable Not Palpable  DP Not Palpable Not Palpable   Gastrointestinal: soft, NTND, -G/R, - HSM, - masses, - CVAT B  Musculoskeletal: M/S 5/5 throughout , Extremities without ischemic changes   Neurologic: CN 2-12 intact , Pain and light touch intact in extremities except decreased touch in R>L feet, Motor exam as listed above  Psychiatric: Judgment intact, Mood & affect appropriate for pt's clinical situation  Dermatologic:  See M/S exam for extremity exam, no rashes otherwise noted  Lymph : No Cervical, Axillary, or Inguinal lymphadenopathy    Non-Invasive Vascular Imaging  Outside ABI (Date: 01/04/2015)  R: 0.38  L: 0.65  Outside Studies/Documentation 8 pages of outside documents were reviewed including: outside ABI, outside PCP charts.  Medical Decision Making  Austin Espinoza is a 59 y.o. male who presents with: RLE critical limb ischemia, LLE intermittent claudication    Patient has a baseline of bilateral intermittent claudication with intermittent sx concerning for rest pain in right leg.  The patient's outside ABI are consistent with his right leg sx.  The paraesthesia and anesthesia however are more consistent the history of HNP.  I discussed with the patient the natural history of critical limb ischemia: 25% require amputation in one year, 50% are able to maintain their limbs in one year, and 25-30% die in one year due to comorbidities.  Given the limb threatening status of this patient, I recommend an aggressive work up including proceeding with an: Aortogram, Bilateral runoff and possible RLE intervention. I discussed with the patient the nature of angiographic procedures, especially the limited patencies of any endovascular intervention. The patient is aware of that the risks of an angiographic procedure include but are not limited to: bleeding, infection, access site complications, embolization,  rupture of treated vessel, dissection, possible need for emergent surgical intervention, and possible need for surgical procedures to treat the patient's pathology. The patient is aware of the risks and agrees to proceed.  The procedure is scheduled for: 16 MAY 16.  I discussed in depth with the patient the nature of atherosclerosis, and emphasized the importance of maximal medical management including strict control of blood pressure, blood glucose, and lipid levels, antiplatelet agents, obtaining regular exercise, and cessation of smoking.  The patient is aware that without maximal medical management the underlying atherosclerotic disease process will progress, limiting the benefit of any interventions. The patient is currently on a statin:  Crestor. The patient is currently on an anti-platelet: ASA.  Thank you for allowing [place] to participate in this patient's care.  Adele Barthel, MD [facility] [place] Office: [phone] Pager: [phone]  01/04/2015, 12:26 PM

## 2015-01-29 NOTE — Transfer of Care (Signed)
Immediate Anesthesia Transfer of Care Note  Patient: Austin Espinoza  Procedure(s) Performed: Procedure(s): BYPASS GRAFT LEFT FEMORAL ARTERY TO RIGHT FEMORAL ARTERY USING GORE PROPATEN GRAFT (Bilateral) ENDARTERECTOMY RIGHT FEMORAL ARTERY AND LEFT FEMORAL ARTERY (Bilateral) INTRA OPERATIVE ARTERIOGRAM, LEFT ILIAC ARTERY (Left) LEFT FEMORAL ARTERY PATCH ANGIOPLASTY USING VASCUGUARD PATCH (Left) THROMBECTOMY LEFT FEMORAL ARTERY (Left)  Patient Location: PACU  Anesthesia Type:General  Level of Consciousness: awake, oriented and patient cooperative  Airway & Oxygen Therapy: Patient Spontanous Breathing and Patient connected to nasal cannula oxygen  Post-op Assessment: Report given to RN and Post -op Vital signs reviewed and stable  Post vital signs: Reviewed  Last Vitals:  Filed Vitals:   01/29/15 0549  BP: 152/71  Pulse: 62  Temp: 36.5 C  Resp: 20    Complications: No apparent anesthesia complications

## 2015-01-29 NOTE — Progress Notes (Signed)
PA unable to get pulses in LLE-she updated Dr Bridgett Larsson. We are going to cont to monitor closely for now. Warm blankets to feet.

## 2015-01-29 NOTE — Progress Notes (Signed)
M. Collins PA at bedside.

## 2015-01-29 NOTE — Progress Notes (Signed)
Lunch relief by T Citty RN 

## 2015-01-29 NOTE — Anesthesia Postprocedure Evaluation (Signed)
  Anesthesia Post-op Note  Patient: Austin Espinoza  Procedure(s) Performed: Procedure(s): BYPASS GRAFT LEFT FEMORAL ARTERY TO RIGHT FEMORAL ARTERY USING GORE PROPATEN GRAFT (Bilateral) ENDARTERECTOMY RIGHT FEMORAL ARTERY AND LEFT FEMORAL ARTERY (Bilateral) INTRA OPERATIVE ARTERIOGRAM, LEFT ILIAC ARTERY (Left) LEFT FEMORAL ARTERY PATCH ANGIOPLASTY USING VASCUGUARD PATCH (Left) THROMBECTOMY LEFT FEMORAL ARTERY (Left)  Patient Location: PACU  Anesthesia Type:General  Level of Consciousness: awake and alert   Airway and Oxygen Therapy: Patient Spontanous Breathing  Post-op Pain: none  Post-op Assessment: Post-op Vital signs reviewed, Patient's Cardiovascular Status Stable and Respiratory Function Stable  Post-op Vital Signs: Reviewed  Filed Vitals:   01/29/15 1410  BP: 110/54  Pulse: 69  Temp:   Resp: 12    Complications: No apparent anesthesia complications

## 2015-01-30 ENCOUNTER — Encounter (HOSPITAL_COMMUNITY): Payer: Self-pay | Admitting: Vascular Surgery

## 2015-01-30 ENCOUNTER — Inpatient Hospital Stay (HOSPITAL_COMMUNITY): Payer: BLUE CROSS/BLUE SHIELD

## 2015-01-30 LAB — CBC
HCT: 26.3 % — ABNORMAL LOW (ref 39.0–52.0)
Hemoglobin: 8.8 g/dL — ABNORMAL LOW (ref 13.0–17.0)
MCH: 27.9 pg (ref 26.0–34.0)
MCHC: 33.5 g/dL (ref 30.0–36.0)
MCV: 83.5 fL (ref 78.0–100.0)
Platelets: 180 10*3/uL (ref 150–400)
RBC: 3.15 MIL/uL — ABNORMAL LOW (ref 4.22–5.81)
RDW: 16.9 % — ABNORMAL HIGH (ref 11.5–15.5)
WBC: 8.9 10*3/uL (ref 4.0–10.5)

## 2015-01-30 LAB — BASIC METABOLIC PANEL
Anion gap: 6 (ref 5–15)
BUN: 12 mg/dL (ref 6–20)
CO2: 24 mmol/L (ref 22–32)
Calcium: 8.7 mg/dL — ABNORMAL LOW (ref 8.9–10.3)
Chloride: 107 mmol/L (ref 101–111)
Creatinine, Ser: 0.98 mg/dL (ref 0.61–1.24)
GFR calc non Af Amer: >60 mL/min (ref >60)
Glucose, Bld: 135 mg/dL — ABNORMAL HIGH (ref 65–99)
POTASSIUM: 4.2 mmol/L (ref 3.5–5.1)
SODIUM: 137 mmol/L (ref 135–145)

## 2015-01-30 LAB — HEPARIN LEVEL (UNFRACTIONATED)
HEPARIN UNFRACTIONATED: 0.21 [IU]/mL — AB (ref 0.30–0.70)
Heparin Unfractionated: 0.37 IU/mL (ref 0.30–0.70)

## 2015-01-30 LAB — GLUCOSE, CAPILLARY
Glucose-Capillary: 102 mg/dL — ABNORMAL HIGH (ref 65–99)
Glucose-Capillary: 129 mg/dL — ABNORMAL HIGH (ref 65–99)
Glucose-Capillary: 112 mg/dL — ABNORMAL HIGH (ref 65–99)

## 2015-01-30 LAB — PREPARE RBC (CROSSMATCH)

## 2015-01-30 MED ORDER — PATIENT'S GUIDE TO USING COUMADIN BOOK
Freq: Once | Status: AC
  Administered 2015-01-30: 18:00:00
  Filled 2015-01-30: qty 1

## 2015-01-30 MED ORDER — WARFARIN SODIUM 10 MG PO TABS
10.0000 mg | ORAL_TABLET | Freq: Once | ORAL | Status: AC
  Administered 2015-01-30: 10 mg via ORAL
  Filled 2015-01-30: qty 1

## 2015-01-30 MED ORDER — PNEUMOCOCCAL VAC POLYVALENT 25 MCG/0.5ML IJ INJ
0.5000 mL | INJECTION | INTRAMUSCULAR | Status: AC
  Administered 2015-01-31: 0.5 mL via INTRAMUSCULAR
  Filled 2015-01-30: qty 0.5

## 2015-01-30 MED ORDER — WARFARIN VIDEO
Freq: Once | Status: AC
  Administered 2015-01-30: 18:00:00

## 2015-01-30 MED ORDER — WARFARIN - PHARMACIST DOSING INPATIENT
Freq: Every day | Status: DC
  Administered 2015-01-31: 10

## 2015-01-30 MED ORDER — SODIUM CHLORIDE 0.9 % IV SOLN
Freq: Once | INTRAVENOUS | Status: AC
  Administered 2015-01-30: 11:00:00 via INTRAVENOUS

## 2015-01-30 NOTE — Progress Notes (Signed)
Attempted to call report. Nurse will call back  Raliegh Ip RN

## 2015-01-30 NOTE — Progress Notes (Signed)
Physical Therapy Evaluation Patient Details Name: Austin Espinoza MRN: [id number] DOB: 24-Dec-1955 Today's Date: 01/30/2015   History of Present Illness  59 yo male with increasing LE numbness from PAD post femoral to femoral bypass artery graft on 01/29/15.   Clinical Impression  Patient is pleasant, agreeable to 'getting out of bed'.  Overall, SBA/Supervision mobility with increased time for tasks due to acute pain from surgery.  Ambulatory on unit with/without assistive device, with staff assist.  Patient is preparing for step-down to regular room, appropriate for PT to continue in hospital.  Anticipate rapid progress to return to prior home when medically stable.    Follow Up Recommendations No PT follow up;Supervision - Intermittent    Equipment Recommendations  None recommended by PT    Recommendations for Other Services       Precautions / Restrictions Restrictions Weight Bearing Restrictions: No      Mobility  Bed Mobility Overal bed mobility: Modified Independent             General bed mobility comments: use of rails and increased time due to pain  Transfers Overall transfer level: Needs assistance Equipment used: None;Rolling walker (2 wheeled) Transfers: Sit to/from Stand Sit to Stand: Supervision         General transfer comment: Decreased due to pain, increased time  Ambulation/Gait Ambulation/Gait assistance: Supervision Ambulation Distance (Feet): 150 Feet Assistive device: None;Rolling walker (2 wheeled) Gait Pattern/deviations: Step-through pattern;Decreased stride length   Gait velocity interpretation: Below normal speed for age/gender    Stairs            Wheelchair Mobility    Modified Rankin (Stroke Patients Only)       Balance Overall balance assessment: Needs assistance   Sitting balance-Leahy Scale: Normal     Standing balance support: No upper extremity supported Standing balance-Leahy Scale: Good   Single Leg Stance  - Right Leg:  (3 seconds) Single Leg Stance - Left Leg:  (3 seconds)   Tandem Stance - Left Leg:  (8-10 seconds)                     Pertinent Vitals/Pain Pain Assessment: 0-10 Pain Score: 8  (with movement, ok at rest) Pain Location: Groin, surgical site Pain Descriptors / Indicators: Numbness;Sore Pain Intervention(s): Monitored during session;Limited activity within patient's tolerance    Home Living Family/patient expects to be discharged to:: Private residence Living Arrangements: Other relatives (Brother) Available Help at Discharge: Family;Available PRN/intermittently Type of Home: House Home Access: Level entry     Home Layout: One level Home Equipment: None      Prior Function Level of Independence: Independent         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        Extremity/Trunk Assessment   Upper Extremity Assessment: Overall WFL for tasks assessed           Lower Extremity Assessment: Overall WFL for tasks assessed      Cervical / Trunk Assessment: Normal  Communication   Communication: No difficulties  Cognition Arousal/Alertness: Awake/alert Behavior During Therapy: WFL for tasks assessed/performed Overall Cognitive Status: Within Functional Limits for tasks assessed                      General Comments      Exercises        Assessment/Plan    PT Assessment    PT Diagnosis Acute pain;Difficulty walking  PT Problem List    PT Treatment Interventions     PT Goals (Current goals can be found in the Care Plan section) Acute Rehab PT Goals Patient Stated Goal: Generally to stop hurting, and return home PT Goal Formulation: With patient Time For Goal Achievement: 02/13/15 Potential to Achieve Goals: Good    Frequency     Barriers to discharge        Co-evaluation               End of Session Equipment Utilized During Treatment: Gait belt Activity Tolerance: Patient  tolerated treatment well Patient left: in chair;with call bell/phone within reach Nurse Communication: Mobility status         Time: 2023-3435 PT Time Calculation (min) (ACUTE ONLY): 28 min   Charges:   PT Evaluation $Initial PT Evaluation Tier I: 1 Procedure PT Treatments $Gait Training: 8-22 mins   PT G CodesZenia Resides, Ronnette Rump L 2015/02/28, 4:04 PM

## 2015-01-30 NOTE — Progress Notes (Signed)
ANTICOAGULATION CONSULT NOTE - Follow-up Consult  Pharmacy Consult for Heparin Indication: s/p fem-fem bypass  No Known Allergies  Patient Measurements: Height: 6\' 1"  (185.4 cm) Weight: 185 lb (83.915 kg) IBW/kg (Calculated) : 79.9 Heparin Dosing Weight: 83.9 kg  Vital Signs: Temp: 98.3 F (36.8 C) (06/01 1134) Temp Source: Oral (06/01 1134) BP: 133/62 mmHg (06/01 1134) Pulse Rate: 66 (06/01 1115)  Labs:  pre-op labs from 01/21/15.    Hgb 12.3, platelet count 323  PT 14.3 seconds, INR 1.09. aPTT 34 seconds  Creatinine 0.92 Heparin level <0.1  Estimated Creatinine Clearance: 89.5 mL/min (by C-G formula based on Cr of 0.98).   Assessment: S/p vascular procedure at multiple sites, including thrombectomy of femorofemoral graft. Heparin level now at goal on 1450 units/hr. No issues with line or bleeding per RN.   Will check confirmatory level this evening.  6/1 start coumadin: coum score 8; coum 10 mg x1, book/video ordered  Goal of Therapy:  INR goal 2-3 Heparin level 0.3-0.7 units/ml Monitor platelets by anticoagulation protocol: Yes   Plan:  Continue heparin gtt to 1450 units/hr Check confirmatory level at 1700 Warfarin 10mg  already ordered  Erin Hearing PharmD., BCPS Clinical Pharmacist Pager [phone] 01/30/2015 12:34 PM

## 2015-01-30 NOTE — Progress Notes (Addendum)
  [facility] Progress Note  01/30/2015 8:03 AM 1 Day Post-Op  Subjective:  Soreness with groin incisions. Right foot feels better.   Tmax 99.3 BP sys 100s-140s 02 99% RA  Filed Vitals:   01/30/15 0547  BP:   Pulse:   Temp: 98.2 F (36.8 C)  Resp:     Physical Exam: Incisions:  Bilateral groin incisions clean and intact. No hematoma. Extremities:  Doppler signals PT and DP bilaterally. Dopplerable fem-fem graft.   CBC    Component Value Date/Time   WBC 8.9 01/30/2015 0201   RBC 3.15* 01/30/2015 0201   HGB 8.8* 01/30/2015 0201   HCT 26.3* 01/30/2015 0201   PLT 180 01/30/2015 0201   MCV 83.5 01/30/2015 0201   MCH 27.9 01/30/2015 0201   MCHC 33.5 01/30/2015 0201   RDW 16.9* 01/30/2015 0201   LYMPHSABS 2.9 03/31/2012 0923   MONOABS 0.6 03/31/2012 0923   EOSABS 0.2 03/31/2012 0923   BASOSABS 0.1 03/31/2012 0923    BMET    Component Value Date/Time   NA 137 01/30/2015 0201   K 4.2 01/30/2015 0201   CL 107 01/30/2015 0201   CO2 24 01/30/2015 0201   GLUCOSE 135* 01/30/2015 0201   BUN 12 01/30/2015 0201   CREATININE 0.98 01/30/2015 0201   CALCIUM 8.7* 01/30/2015 0201   GFRNONAA >60 01/30/2015 0201   GFRAA >60 01/30/2015 0201    INR    Component Value Date/Time   INR 1.09 01/21/2015 0846     Intake/Output Summary (Last 24 hours) at 01/30/15 0803 Last data filed at 01/30/15 0600  Gross per 24 hour  Intake 4096.08 ml  Output   4115 ml  Net -18.92 ml     Assessment:  59 y.o. male is s/p:  1. Right common femoral artery endarterectomy 2. Left-to-right femorofemoral bypass with Dacron 3. Open cannulation of left common femoral artery  4. Left iliac angiogram 5. Thrombectomy of femorofemoral graft 6. Left iliofemoral endarterectomy with bovine patch angioplasty 7. Redo Left-to-right femorofemoral bypass with Propaten 1 Day Post-Op  Plan: -Fem-fem bypass patent. Pedal doppler signals bilaterally.  -Acute surgical blood loss  anemia: Will transfuse 1 unit pRBCs. -Thrombosis of fem-fem graft intra-operatively requiring redo. Will start coumadin today. Continue heparin.  -Mobilize.  -Transfer to [place], PA-C [facility] Office: [phone] Pager: [phone] 01/30/2015 8:03 AM   Addendum  I have independently interviewed and examined the patient, and I agree with the physician assistant's findings.  Given findings intraoperatively of on-table thrombosis of fem-fem graft, I have concerns with thrombophilia.  Hypercoag lab ordered.  Will take a few days to come back.   Meanwhile heparin drip bridge to coumadin.  Both feet with doppler signals today.  Right foot warmer.  Pt thinks right foot feels better.  ABI pending.  Ok to transfer to flow.  Pt has known history of CAD with EF 30% so would go ahead give the patient one unit of pRBC.  There was significant blood loss due to the thrombectomy and bilateral femoral endarterectomy that was necessary.  Adele Barthel, MD [facility] [place] Office: [phone] Pager: [phone]  01/30/2015, 8:17 AM

## 2015-01-30 NOTE — Progress Notes (Signed)
ANTICOAGULATION CONSULT NOTE - Follow-up Consult  Pharmacy Consult for Heparin Indication: s/p fem-fem bypass  No Known Allergies  Patient Measurements: Height: 6\' 1"  (185.4 cm) Weight: 185 lb (83.915 kg) IBW/kg (Calculated) : 79.9 Heparin Dosing Weight: 83.9 kg  Vital Signs: Temp: 98.3 F (36.8 C) (05/31 2330) Temp Source: Oral (05/31 2330) BP: 108/55 mmHg (06/01 0000) Pulse Rate: 68 (06/01 0000)  Labs:  pre-op labs from 01/21/15.    Hgb 12.3, platelet count 323  PT 14.3 seconds, INR 1.09. aPTT 34 seconds  Creatinine 0.92 Heparin level <0.1  Estimated Creatinine Clearance: 89.5 mL/min (by C-G formula based on Cr of 0.98).   Assessment: S/p vascular procedure at multiple sites, including thrombectomy of femorofemoral graft. Heparin level undetectable on 1200 units/hr. No issues with line or bleeding per RN.  Goal of Therapy:  Heparin level 0.3-0.7 units/ml Monitor platelets by anticoagulation protocol: Yes   Plan:  Increase heparin gtt to 1450 units/hr Will f/u 6 hr heparin level  Sherlon Handing, PharmD, BCPS Clinical pharmacist, pager [phone] 01/30/2015,3:44 AM

## 2015-01-30 NOTE — Progress Notes (Signed)
Received report

## 2015-01-30 NOTE — Progress Notes (Signed)
Pt transferred from 3S. VSS. No complaints of pain. Pt oriented to unit and call bell iwithin reach. Will continue to monitor.  Raliegh Ip RN

## 2015-01-30 NOTE — Progress Notes (Addendum)
Pt to [place] to 2W-19, VSS, called report, family aware of [place]

## 2015-01-30 NOTE — Progress Notes (Signed)
Chaplain respond to consult   Spoke with nurse Austin Espinoza was just bought to two [place] after surgery. He is doing well.  Stopped by, he was on the phone with family.  Will follow-up if needed.   01/30/15 1800  Clinical Encounter Type  Visited With Health care provider  Visit Type Initial;Spiritual support  Referral From Chaplain  Consult/Referral To Nurse  Spiritual Encounters  Spiritual Needs Prayer

## 2015-01-30 NOTE — Progress Notes (Signed)
Utilization review complete. Clinicals sent.  Jonnie Finner RN CCM Case Mgmt phone [phone]

## 2015-01-30 NOTE — Progress Notes (Signed)
Attempted to call report  Austin Espinoza

## 2015-01-30 NOTE — Progress Notes (Signed)
ANTICOAGULATION CONSULT NOTE - Follow-up Consult  Pharmacy Consult for Heparin Indication: s/p fem-fem bypass  No Known Allergies  Patient Measurements: Height: 6\' 1"  (185.4 cm) Weight: 185 lb (83.915 kg) IBW/kg (Calculated) : 79.9 Heparin Dosing Weight: 83.9 kg  Vital Signs: Temp: 97.4 F (36.3 C) (06/01 1618) Temp Source: Oral (06/01 1618) BP: 132/63 mmHg (06/01 1618) Pulse Rate: 73 (06/01 1618)   Estimated Creatinine Clearance: 89.5 mL/min (by C-G formula based on Cr of 0.98).   Assessment: S/p vascular procedure at multiple sites, including thrombectomy of femorofemoral graft. Heparin drip 1450 uts/hr heparin level 0.2 <  goal. No issues with line or bleeding per RN.   Goal of Therapy:  INR goal 2-3 Heparin level 0.3-0.7 units/ml Monitor platelets by anticoagulation protocol: Yes   Plan:  Increase  heparin gtt to 1600 units/hr Daily HL, CBC  Bonnita Nasuti Pharm.D. CPP, BCPS Clinical Pharmacist [phone] 01/30/2015 6:33 PM

## 2015-01-31 ENCOUNTER — Inpatient Hospital Stay (HOSPITAL_COMMUNITY): Payer: BLUE CROSS/BLUE SHIELD

## 2015-01-31 ENCOUNTER — Encounter (HOSPITAL_COMMUNITY): Payer: BLUE CROSS/BLUE SHIELD

## 2015-01-31 DIAGNOSIS — Z9889 Other specified postprocedural states: Secondary | ICD-10-CM

## 2015-01-31 LAB — BETA-2-GLYCOPROTEIN I ABS, IGG/M/A

## 2015-01-31 LAB — TYPE AND SCREEN
ABO/RH(D): O POS
Antibody Screen: NEGATIVE
Unit division: 0

## 2015-01-31 LAB — GLUCOSE, CAPILLARY
GLUCOSE-CAPILLARY: 95 mg/dL (ref 65–99)
Glucose-Capillary: 113 mg/dL — ABNORMAL HIGH (ref 65–99)
Glucose-Capillary: 96 mg/dL (ref 65–99)

## 2015-01-31 LAB — HEMOGLOBIN A1C
Hgb A1c MFr Bld: 6.4 % — ABNORMAL HIGH (ref 4.8–5.6)
Mean Plasma Glucose: 137 mg/dL

## 2015-01-31 LAB — PROTEIN C, TOTAL: PROTEIN C, TOTAL: 80 % (ref 70–140)

## 2015-01-31 LAB — CBC
HCT: 33 % — ABNORMAL LOW (ref 39.0–52.0)
HEMOGLOBIN: 10.9 g/dL — AB (ref 13.0–17.0)
MCH: 27.3 pg (ref 26.0–34.0)
MCHC: 33 g/dL (ref 30.0–36.0)
MCV: 82.5 fL (ref 78.0–100.0)
Platelets: 212 10*3/uL (ref 150–400)
RBC: 4 MIL/uL — AB (ref 4.22–5.81)
RDW: 16.8 % — AB (ref 11.5–15.5)
WBC: 14.5 10*3/uL — AB (ref 4.0–10.5)

## 2015-01-31 LAB — HOMOCYSTEINE: HOMOCYSTEINE-NORM: 12.3 umol/L (ref 0.0–15.0)

## 2015-01-31 LAB — PROTIME-INR
INR: 1.31 (ref 0.00–1.49)
Prothrombin Time: 16.4 seconds — ABNORMAL HIGH (ref 11.6–15.2)

## 2015-01-31 LAB — HEPARIN LEVEL (UNFRACTIONATED): Heparin Unfractionated: 0.45 IU/mL (ref 0.30–0.70)

## 2015-01-31 MED ORDER — WARFARIN SODIUM 10 MG PO TABS
10.0000 mg | ORAL_TABLET | Freq: Once | ORAL | Status: AC
  Administered 2015-01-31: 10 mg via ORAL
  Filled 2015-01-31: qty 1

## 2015-01-31 MED ORDER — HEPARIN (PORCINE) IN NACL 100-0.45 UNIT/ML-% IJ SOLN
1600.0000 [IU]/h | INTRAMUSCULAR | Status: AC
Start: 2015-01-31 — End: 2015-01-31
  Administered 2015-01-31: 1600 [IU]/h via INTRAVENOUS
  Filled 2015-01-31 (×3): qty 250

## 2015-01-31 MED ORDER — WARFARIN SODIUM 7.5 MG PO TABS: 7.5000 mg | ORAL_TABLET | Freq: Every day | ORAL | Status: DC

## 2015-01-31 MED ORDER — ENOXAPARIN (LOVENOX) PATIENT EDUCATION KIT
PACK | Freq: Once | Status: AC
  Administered 2015-01-31: 16:00:00
  Filled 2015-01-31: qty 1

## 2015-01-31 MED ORDER — OXYCODONE-ACETAMINOPHEN 5-325 MG PO TABS: 1.0000 | ORAL_TABLET | Freq: Four times a day (QID) | ORAL | Status: DC | PRN

## 2015-01-31 MED ORDER — ENOXAPARIN SODIUM 80 MG/0.8ML ~~LOC~~ SOLN
80.0000 mg | Freq: Two times a day (BID) | SUBCUTANEOUS | Status: DC
Start: 2015-01-31 — End: 2016-12-25

## 2015-01-31 MED ORDER — ENOXAPARIN SODIUM 80 MG/0.8ML ~~LOC~~ SOLN
80.0000 mg | Freq: Two times a day (BID) | SUBCUTANEOUS | Status: DC
  Filled 2015-01-31 (×2): qty 0.8

## 2015-01-31 MED ORDER — ENOXAPARIN SODIUM 80 MG/0.8ML ~~LOC~~ SOLN
80.0000 mg | Freq: Two times a day (BID) | SUBCUTANEOUS | Status: DC
  Administered 2015-01-31: 80 mg via SUBCUTANEOUS
  Filled 2015-01-31 (×2): qty 0.8

## 2015-01-31 NOTE — Progress Notes (Addendum)
[place] for Lovenox Indication: s/p fem-fem bypass  No Known Allergies  Patient Measurements: Height: 6\' 1"  (185.4 cm) Weight: 185 lb (83.915 kg) IBW/kg (Calculated) : 79.9 Heparin Dosing Weight: 83.9 kg  Vital Signs: Temp: 99.1 F (37.3 C) (06/02 0647) Temp Source: Oral (06/02 0647) BP: 136/77 mmHg (06/02 0647) Pulse Rate: 86 (06/02 0647)   Estimated Creatinine Clearance: 89.5 mL/min (by C-G formula based on Cr of 0.98).   Assessment: 59 y.o. male s/p peripheral bypass grafts and thrombectomy, awaiting discharge, for bridging with Lovenox  Goal of Therapy:  INR goal 2-3 Full anticoagulation with Lovenox Monitor platelets by anticoagulation protocol: Yes   Plan:  Stop heparin now Lovenox 80 mg SQ q12h  Phillis Knack, PharmD, BCPS   01/31/2015 7:56 AM     Discharge planning in process.  Will continue heparin until 2pm this afternoon, and start Lovenox 80 mg Q12h, so that patient can get doses here today, have time to fill Rx and start home dose tomorrow.  Will repeat 10 mg warfarin today.  Consider 7.5 mg Daily discharge warfarin RX with plan for INR check early next week.  Thank you for allowing pharmacy to be a part of this patients care team.  Rowe Robert Pharm.D., BCPS, AQ-Cardiology Clinical Pharmacist 01/31/2015 10:40 AM Pager: [phone] Phone: [phone]

## 2015-01-31 NOTE — Progress Notes (Signed)
VASCULAR LAB PRELIMINARY  ARTERIAL  ABI completed - Marked improvement in the left sided ABI value when compared to prior study 0.38 following s/p intervention.    RIGHT    LEFT    PRESSURE WAVEFORM  PRESSURE WAVEFORM  BRACHIAL 154 Tri BRACHIAL    DP 86 mono DP 83   PT 77 mono PT 88     RIGHT LEFT  ABI 0.56 0.57     Makye Radle D, RDMS, RVT 01/31/2015, 10:35 AM

## 2015-01-31 NOTE — Discharge Instructions (Signed)

## 2015-01-31 NOTE — Care Management Note (Addendum)
Case Management Note  Patient Details  Name: Austin Espinoza MRN: [id number] Date of Birth: 05/10/1956  Subjective/Objective:   Pt is s/p left-to-right femorofemoral bypass.    Action/Plan:  MD requesting pt to go home on Lovenox.  CM will assist with medication for discharge.   Expected Discharge Date:                  Expected Discharge Plan:  Home/Self Care  In-House Referral:     Discharge planning Services  CM Consult, Medication Assistance (Lovenox)  Post Acute Care Choice:    Choice offered to:     DME Arranged:    DME Agency:     HH Arranged:    HH Agency:     Status of Service:  Completed, signed off  Medicare Important Message Given:  No Date Medicare IM Given:    Medicare IM give by:    Date Additional Medicare IM Given:    Additional Medicare Important Message give by:     If discussed at Long Length of Stay Meetings, dates discussed:    Disposition Plan:  Home with Home Health  Additional Comments: 01/31/15 Samantha Claxton, RN, BSN [phone].  CM submitted benefit check for 80mg BID lovenox        Pt copay will be $4,786 for brand- generic enoxaparin is preferred will be $733 - deductible of $5178.69 has not been met.             01/31/15 12:00 Per pt; he has the money in the account from his paycheck deposited today to pay for medication, however pt states he wants to pick up medication at [facility] regardless of cost, CM informed [facility]137.05 with provided discount card and the cost at [facility] would be $50.  Pt acknowledged variance but stated he would have to pick it up in [place] tomorrow morning.  Pt will go home to [place] post discharge today.  CM provide pt with [facility] information including address and hours, also provided [facility] Card for [facility], pharmacist and bedside nurse in room during discussion.  CM contacted  [facility] and was informed that prescription  is available for refill at this time and pt will be able to use discount drug card.  CM educated pt on importance of taking medication as prescribed.  No additional CM needs at this time.  MD is aware of pt plan to purchase drug.       01/31/15 11:00 Samantha Claxton, RN, BS [phone] CM contacted [facility] to request out of pocket amount without insurance.  [facility] stated that presciption for approximately $50.  CM communicated cost comparison along with teaching of importance of med to pt, pt stated he can not afford copay.  CM asked pt if he could borrow money, pt offered to call bank to see if direct deposit had been entered.  CM will follow back up with pt  Claxton, Samantha S, RN 01/31/2015, 11:38 AM  

## 2015-01-31 NOTE — Progress Notes (Signed)
   Daily Progress Note  Assessment/Planning: POD #2 s/p L-to-R fem-fem BPG, L iliofem EA w/ BPA, R fem EA, redo L-to-R fem-fem BPG   No PT/OT needs  Ambulating pain free  ABI pending  Ok to D/C this evening  (per pt's preference)  Need to setup d/c mgmt of coumadin  Subjective  - 2 Days Post-Op  Minimal discomfort  Objective Filed Vitals:   01/30/15 1618 01/30/15 2035 01/31/15 0000 01/31/15 0647  BP: 132/63 157/74 140/73 136/77  Pulse: 73 86  86  Temp: 97.4 F (36.3 C) 99.2 F (37.3 C)  99.1 F (37.3 C)  TempSrc: Oral Oral  Oral  Resp: 12 18  18   Height:      Weight:      SpO2: 100% 100%  100%    Intake/Output Summary (Last 24 hours) at 01/31/15 0727 Last data filed at 01/31/15 0648  Gross per 24 hour  Intake 1807.66 ml  Output   2600 ml  Net -792.34 ml    PULM  CTAB CV  RRR GI  soft, NTND VASC  Doppler signal B, B groin inc c/d/i, dopplerable fem-fem BPG  Laboratory CBC    Component Value Date/Time   WBC 14.5* 01/31/2015 0525   HGB 10.9* 01/31/2015 0525   HCT 33.0* 01/31/2015 0525   PLT 212 01/31/2015 0525    BMET    Component Value Date/Time   NA 137 01/30/2015 0201   K 4.2 01/30/2015 0201   CL 107 01/30/2015 0201   CO2 24 01/30/2015 0201   GLUCOSE 135* 01/30/2015 0201   BUN 12 01/30/2015 0201   CREATININE 0.98 01/30/2015 0201   CALCIUM 8.7* 01/30/2015 0201   GFRNONAA >60 01/30/2015 0201   GFRAA >60 01/30/2015 0201    Adele Barthel, MD [facility] [place]: [phone] Pager: [phone]  01/31/2015, 7:27 AM

## 2015-01-31 NOTE — Discharge Summary (Signed)
[facility] Discharge Summary  Austin Espinoza 01/18/1956 59 y.o. male  [id number]  Admission Date: 01/29/2015  Discharge Date: 01/31/2015  Physician: Conrad Leasburg, MD  Admission Diagnosis: Peripheral vascular disease with bilateral lower extremity claudication I70.213; Right lower extremity rest pain I70.229  HPI:   This is a 59 y.o. male who presented for evaluation with chief complaint: right > left leg numbness. Onset of symptom occurred >1 year ago, without obvious trigger. He notes chronic paraesthesia with intermittent frank anesthesia in R>L. He has been told previous he has possible herniated disc in his back. The patient notes short distance intermittent claudication along with intermittent rest pain which wakes him up. Pain is described as cramping, severity 1-10/10, and associated with ambulation and sleeping. Patient has attempted to treat this pain with rest. The patient denies leg wounds/ulcers. Atherosclerotic risk factors include: borderline DM, HTN, HLD, and active smoking.  Hospital Course:  The patient was admitted to the hospital and taken to the operating room on 01/29/2015 and underwent: 1. Right common femoral artery endarterectomy 2. Left-to-right femorofemoral bypass with Dacron 3. Open cannulation of left common femoral artery  4. Left iliac angiogram 5. Thrombectomy of femorofemoral graft 6. Left iliofemoral endarterectomy with bovine patch angioplasty 7. Redo Left-to-right femorofemoral bypass with Propaten  Complications: Intraoperative thrombosis of femorofemoral graft.   The patient tolerated the procedure well and was transported to the PACU in stable condition. He was started on heparin post-operatively due to concerns for thrombophilia and hypercoagulable panel was ordered.   On POD 1, he had doppler signals to both feet and the patient felt that his right foot pain was improving. He has a known history of CAD with EF of  30% and he had significant blood loss during surgery so one unit of pRBCs was transfused. He started on coumadin and heparin was continued. He was transferred to the floor.   On POD 2, he continued to do well. His bilateral groin incisions were healing well. He had doppler signals to him fem-fem bypass graft and pedal doppler signals bilaterally. His heparin was discontinued and started on a lovenox bridge to coumadin. He was given instructions on how to administer the lovenox. A follow-up appointment for PT/INR was set up for 02/04/15. He was ambulating pain free with no needs for PT or OT follow-up. He was discharged home on POD 2 in good condition.    CBC    Component Value Date/Time   WBC 14.5* 01/31/2015 0525   RBC 4.00* 01/31/2015 0525   HGB 10.9* 01/31/2015 0525   HCT 33.0* 01/31/2015 0525   PLT 212 01/31/2015 0525   MCV 82.5 01/31/2015 0525   MCH 27.3 01/31/2015 0525   MCHC 33.0 01/31/2015 0525   RDW 16.8* 01/31/2015 0525   LYMPHSABS 2.9 03/31/2012 0923   MONOABS 0.6 03/31/2012 0923   EOSABS 0.2 03/31/2012 0923   BASOSABS 0.1 03/31/2012 0923    BMET    Component Value Date/Time   NA 137 01/30/2015 0201   K 4.2 01/30/2015 0201   CL 107 01/30/2015 0201   CO2 24 01/30/2015 0201   GLUCOSE 135* 01/30/2015 0201   BUN 12 01/30/2015 0201   CREATININE 0.98 01/30/2015 0201   CALCIUM 8.7* 01/30/2015 0201   GFRNONAA >60 01/30/2015 0201   GFRAA >60 01/30/2015 0201     Discharge Instructions:   The patient is discharged to home with extensive instructions on wound care and progressive ambulation.  They are instructed not to drive  or perform any heavy lifting until returning to see the physician in his office.    Discharge Diagnosis:  Peripheral vascular disease with bilateral lower extremity claudication I70.213; Right lower extremity rest pain I70.229  Secondary Diagnosis: Patient Active Problem List   Diagnosis Date Noted  . Critical lower limb ischemia 01/04/2015  .  Pre-operative cardiovascular examination 01/04/2015  . Skin lesions, generalized 12/28/2012  . PAD (peripheral artery disease)   . Claudication   . CAD (coronary artery disease)   . Ejection fraction < 50%   . Dyslipidemia   . Hypertension   . Tobacco abuse    Past Medical History  Diagnosis Date  . CAD (coronary artery disease)     Bare-metal stent, LAD, 2007, total RCA, right to right collaterals  . Ejection fraction < 50%     EF 40% in the past  . Dyslipidemia   . Tobacco abuse   . Hypertension   . PAD (peripheral artery disease)     Arterial leg Doppler, December 29, 2011, greater than 50% focal stenosis of the left mid S.FA, right ABI mild range left ABI moderate range  //   consultation by Dr. Angelena Form, Jan 19, 2012. CTA with severe right CIA stenosis, bil SFA.   . Type II diabetes mellitus   . Hyperlipemia   . Anemia   . History of blood transfusion 01/29/2015    post op       Medication List    ASK your doctor about these medications        aspirin 81 MG tablet  Take 1 tablet (81 mg total) by mouth daily.     atenolol 25 MG tablet  Commonly known as:  TENORMIN  TAKE ONE TABLET BY MOUTH ONCE DAILY     diazepam 5 MG tablet  Commonly known as:  VALIUM  Take 5 mg by mouth every 6 (six) hours as needed for anxiety.     gabapentin 300 MG capsule  Commonly known as:  NEURONTIN  Take 300 mg by mouth 3 (three) times daily.     HYDROcodone-acetaminophen 5-325 MG per tablet  Commonly known as:  NORCO/VICODIN  Take 1 tablet by mouth every 6 (six) hours as needed for moderate pain.     lisinopril 20 MG tablet  Commonly known as:  PRINIVIL,ZESTRIL  TAKE ONE TABLET BY MOUTH ONCE DAILY. NEEDS  APPOINTMENT  FOR  FURTHER  REFILLS.     metFORMIN 500 MG tablet  Commonly known as:  GLUCOPHAGE  Take 500 mg by mouth 2 (two) times daily with a meal.     rosuvastatin 10 MG tablet  Commonly known as:  CRESTOR  Take 10 mg by mouth daily.     Vitamin D 2000 UNITS Caps  Take  2,000 Units by mouth daily.       Prescriptons/Orders -Lovenox 80 mg SQ q 12 hours until INR therapeutic 2-3. -Coumadin 7.5 mg daily until therapeutic then per PCP.  -Roxicet #30 No Refill  Disposition: Home  Patient's condition: is Good  Follow up: 1. Dr. Tenna Delaine office on  02/04/15 for INR check. 2. Dr. Jannette Fogo on 02/06/15 for post-hospital f/u 3. Dr. Bridgett Larsson in 2 weeks   Virgina Jock, PA-C [facility] [phone] 01/31/2015  8:46 AM   Addendum  I have independently interviewed and examined the patient, and I agree with the physician assistant's discharge summary.  This patient underwent a left-to-right femorofemoral bypass due to intermittent rest pain in right leg, ABI 0.38.  Intraoperatively, unfortunately, his dacron femorofemoral bypass thrombosed.  I redid the femorofemoral bypass with Propaten instead.  It has been my experience that patient that clot intraoperatively frequently have thrombophilia.  I drew a hypercoagulable screening and started Heparin drip post-op.  The patient has recovered rapidly from his surgery and now is able to discharge.  Will continue Lovenox bridge to Coumadin until hypercoagulability panel comes back.  The patient will follow up in the office in 2 weeks.  Adele Barthel, MD [facility] [place] Office: [phone] Pager: [phone]  01/31/2015, 2:04 PM    - For VQI Registry use --- Instructions: Press F2 to tab through selections.  Delete question if not applicable.   Post-op:  Wound infection: No  Graft infection: No  Transfusion: Yes  If yes, 1 units given New Arrhythmia: No Ipsilateral amputation: No, [ ]  Minor, [ ]  BKA, [ ]  AKA Discharge patency: [x ] Primary, [ ]  Primary assisted, [ ]  Secondary, [ ]  Occluded Patency judged by: [x ] Dopper only, [ ]  Palpable graft pulse, [ ]  Palpable distal pulse, [ ]  ABI inc. > 0.15, [ ]  Duplex Discharge ABI: R 0.56, L 0.57 D/C Ambulatory Status:  Ambulatory  Complications: MI: No, [ ]  Troponin only, [ ]  EKG or Clinical CHF: No Resp failure:No, [ ]  Pneumonia, [ ]  Ventilator Chg in renal function: No, [ ]  Inc. Cr > 0.5, [ ]  Temp. Dialysis, [ ]  Permanent dialysis Stroke: No, [ ]  Minor, [ ]  Major Return to OR: No  Reason for return to OR: [ ]  Bleeding, [ ]  Infection, [ ]  Thrombosis, [ ]  Revision  Discharge medications: Statin use:  yes ASA use:  yes Plavix use:  no Beta blocker use: yes Coumadin use: yes

## 2015-01-31 NOTE — Evaluation (Signed)
Occupational Therapy Evaluation Patient Details Name: Austin Espinoza MRN: [id number] DOB: 1955/10/06 Today's Date: 01/31/2015    History of Present Illness 59 yo male with increasing LE numbness from PAD post femoral to femoral bypass artery graft on 01/29/15.    Clinical Impression   PTA pt lived at home and was independent with ADLs. He is currently at Supervision level for functional mobility without use of DME (requires increased assist when using RW due to unsafe technique). Pt requires assist to reach LEs and educated on compensatory techniques and increasing ROM. No further acute OT needs.     Follow Up Recommendations  No OT follow up    Equipment Recommendations  None recommended by OT    Recommendations for Other Services       Precautions / Restrictions Restrictions Weight Bearing Restrictions: No      Mobility Bed Mobility Overal bed mobility: Modified Independent                Transfers Overall transfer level: Needs assistance Equipment used: Rolling walker (2 wheeled);None Transfers: Sit to/from Stand Sit to Stand: Supervision         General transfer comment: Supervision for safety. VC's for hand placement.          ADL Overall ADL's : Needs assistance/impaired Eating/Feeding: Independent;Sitting   Grooming: Wash/dry hands;Wash/dry face;Supervision/safety;Standing (shaving)   Upper Body Bathing: Set up;Sitting   Lower Body Bathing: Minimal assistance;Sit to/from stand   Upper Body Dressing : Supervision/safety;Sitting   Lower Body Dressing: Minimal assistance;Sit to/from stand   Toilet Transfer: Supervision/safety;Ambulation   Toileting- Clothing Manipulation and Hygiene: Supervision/safety;Sit to/from stand       Functional mobility during ADLs: Supervision/safety General ADL Comments: Pt ambulated with RW with min guard level for safety, however does better without RW/DME at Supervision level. Needs assist to reach LEs      Vision Additional Comments: No change from baseline          Pertinent Vitals/Pain Pain Assessment: 0-10 Pain Score: 4  Pain Location: groin; RLE Pain Descriptors / Indicators: Aching;Sore Pain Intervention(s): Monitored during session;Repositioned     Hand Dominance Right   Extremity/Trunk Assessment Upper Extremity Assessment Upper Extremity Assessment: Overall WFL for tasks assessed   Lower Extremity Assessment Lower Extremity Assessment: Defer to PT evaluation   Cervical / Trunk Assessment Cervical / Trunk Assessment: Normal   Communication Communication Communication: No difficulties   Cognition Arousal/Alertness: Awake/alert Behavior During Therapy: WFL for tasks assessed/performed Overall Cognitive Status: Within Functional Limits for tasks assessed                                Home Living Family/patient expects to be discharged to:: Private residence Living Arrangements: Other relatives (brother) Available Help at Discharge: Family;Available PRN/intermittently Type of Home: House Home Access: Level entry     Home Layout: One level     Bathroom Shower/Tub: Tub/shower unit Shower/tub characteristics: Architectural technologist: Standard     Home Equipment: None          Prior Functioning/Environment Level of Independence: Independent        Comments: works at [employer] in [employer]    OT Diagnosis: Generalized weakness;Acute pain    End of Session Equipment Utilized During Treatment: Rolling walker Nurse Communication: Mobility status  Activity Tolerance: Patient tolerated treatment well Patient left: in chair;with call bell/phone within reach   Time: 0826-0852 OT Time Calculation (min): 26 min  Charges:  OT General Charges $OT Visit: 1 Procedure OT Evaluation $Initial OT Evaluation Tier I: 1 Procedure OT Treatments $Self Care/Home Management : 8-22 mins G-Codes:    Juluis Rainier 03-01-2015, 8:56  AM  Cyndie Chime, OTR/L Occupational Therapist [phone] (pager)

## 2015-02-01 LAB — PROTEIN S, TOTAL: Protein S Ag, Total: 118 % (ref 58–150)

## 2015-02-01 LAB — CARDIOLIPIN ANTIBODIES, IGG, IGM, IGA
Anticardiolipin IgG: <9 GPL U/mL (ref 0–14)
Anticardiolipin IgM: <9 MPL U/mL (ref 0–12)

## 2015-02-01 LAB — PROTEIN C ACTIVITY: PROTEIN C ACTIVITY: 108 % (ref 74–151)

## 2015-02-01 LAB — LUPUS ANTICOAGULANT PANEL
DRVVT: 16.6 s (ref 0.0–55.1)
PTT Lupus Anticoagulant: 35.2 s (ref 0.0–50.0)

## 2015-02-01 LAB — PROTEIN S ACTIVITY: Protein S Activity: 68 % (ref 60–145)

## 2015-02-04 LAB — FACTOR 5 LEIDEN

## 2015-02-04 LAB — PROTHROMBIN GENE MUTATION

## 2015-03-05 ENCOUNTER — Encounter: Payer: Self-pay | Admitting: Vascular Surgery

## 2015-03-06 ENCOUNTER — Encounter: Payer: Self-pay | Admitting: Vascular Surgery

## 2015-03-08 ENCOUNTER — Encounter: Payer: Self-pay | Admitting: *Deleted

## 2015-03-08 ENCOUNTER — Ambulatory Visit (INDEPENDENT_AMBULATORY_CARE_PROVIDER_SITE_OTHER): Payer: Self-pay | Admitting: Vascular Surgery

## 2015-03-08 ENCOUNTER — Encounter: Payer: Self-pay | Admitting: Vascular Surgery

## 2015-03-08 VITALS — BP 148/66 | HR 52 | Ht 73.0 in | Wt 186.3 lb

## 2015-03-08 DIAGNOSIS — I998 Other disorder of circulatory system: Secondary | ICD-10-CM

## 2015-03-08 DIAGNOSIS — I70229 Atherosclerosis of native arteries of extremities with rest pain, unspecified extremity: Secondary | ICD-10-CM

## 2015-03-08 NOTE — Progress Notes (Signed)
    Postoperative Visit   History of Present Illness  Austin Espinoza is a 59 y.o. year old male who presents for postoperative follow-up for:  1. R CFA EA 2.  L-to-R fem-fem BPG w/ dacron which clotted on table  3.  TE fem-fem BPG 4.  L iliofemoral EA w/ BPA 5.  L to R fem-fem BPG (Date: 01/29/15).  The patient's wounds are healed.  The patient notes great improvement in R leg sx.  The patient is able to complete their activities of daily living.  The patient's current symptoms are: mild claudication with improved distance.  He still feels his fem-fem graft is bothering him and limiting his mobility.  For VQI Use Only  PRE-ADM LIVING: Home  AMB STATUS: Ambulatory  Physical Examination  Filed Vitals:   03/08/15 0855  BP: 148/66  Pulse: 52   BLE: groins incisions are healed, faint pulse in fem-fem graft, dopplerable fem-fem signal, faintly palpable R pedal pulses   Medical Decision Making  [name] is a 59 y.o. year old male who presents s/p L-to-R fem-fem BPG complicated by thrombosis leading redo fem-fem bypass on the table.   Thrombophilia screen was negative, but patient had already had heparin.  Given the marginal flow in the fem-fem graft, I would consider using Coumadin as an adjunct.  I imaged the left iliac system on the table, but no obvious lesion needed intervention.  This raises concerns that his cardiac output is limited.  The patient's bypass incisions are healing appropriately with resolution of pre-operative symptoms. I discussed in depth with the patient the nature of atherosclerosis, and emphasized the importance of maximal medical management including strict control of blood pressure, blood glucose, and lipid levels, obtaining regular exercise, and cessation of smoking.  The patient is aware that without maximal medical management the underlying atherosclerotic disease process will progress, limiting the benefit of any interventions. The patient's  surveillance will included ABI and bypass duplex studies which will be completed in: 3 months, at which time the patient will be re-evaluated.   I emphasized the importance of routine surveillance of the patient's bypass, as the vascular surgery literature emphasize the improved patency possible with assisted primary patency procedures versus secondary patency procedures. The patient agrees to participate in their maximal medical care and routine surveillance.  Thank you for allowing [place] to participate in this patient's care.  Adele Barthel, MD [facility] [place] Office: [phone] Pager: [phone]  03/08/2015, 1:10 PM

## 2015-03-19 ENCOUNTER — Encounter: Payer: Self-pay | Admitting: Vascular Surgery

## 2015-03-22 ENCOUNTER — Ambulatory Visit (INDEPENDENT_AMBULATORY_CARE_PROVIDER_SITE_OTHER): Payer: Self-pay | Admitting: Vascular Surgery

## 2015-03-22 ENCOUNTER — Encounter: Payer: Self-pay | Admitting: Vascular Surgery

## 2015-03-22 VITALS — BP 172/81 | HR 59 | Ht 73.0 in | Wt 191.6 lb

## 2015-03-22 DIAGNOSIS — I998 Other disorder of circulatory system: Secondary | ICD-10-CM

## 2015-03-22 DIAGNOSIS — I70229 Atherosclerosis of native arteries of extremities with rest pain, unspecified extremity: Secondary | ICD-10-CM

## 2015-03-22 DIAGNOSIS — Z48812 Encounter for surgical aftercare following surgery on the circulatory system: Secondary | ICD-10-CM

## 2015-03-22 NOTE — Addendum Note (Signed)
Addended by: Dorthula Rue L on: 03/22/2015 03:31 PM   Modules accepted: Orders

## 2015-03-22 NOTE — Progress Notes (Signed)
    Postoperative Visit   History of Present Illness  Austin Espinoza is a 59 y.o. (07-26-1956) male  who presents for postoperative follow-up for:  1. R CFA EA 2. L-to-R fem-fem BPG w/ dacron which clotted on table  3. TE fem-fem BPG 4. L iliofemoral EA w/ BPA 5. L to R fem-fem BPG (Date: 01/29/15).   The patient's wounds are healed. He thinks the swelling in his suprapubic region is improved.  The patient is able to complete their activities of daily living. The patient's current symptoms are: mild claudication with improved distance. He still feels some tugging in his surgical sites with limit his mobility.  Pt returns today to evaluate his ability to return to work.  For VQI Use Only  PRE-ADM LIVING: Home  AMB STATUS: Ambulatory  Physical Examination  Filed Vitals:   03/22/15 1129 03/22/15 1131  BP: 172/91 172/81  Pulse: 59   Height: 6\' 1"  (1.854 m)   Weight: 191 lb 9.6 oz (86.909 kg)   SpO2: 99%     BLE: groins incisions are healed, improved pulse in fem-fem graft, biphasic fem-fem signal, faintly palpable R pedal pulses   Medical Decision Making  Austin Espinoza is a 59 y.o. (1955-10-21) male  who presents s/p L-to-R fem-fem BPG complicated by thrombosis leading redo fem-fem bypass on the table.   Pt is nearing 6 weeks recovery and should be nearing ability to return to work.  In some patients, the fem-fem graft takes some time to heal enough that sensitivity in the groin incisions and along the graft tunnel improves.  Another week off then return to light duty for one month.  Follow up in 3 months with ABI and fem-fem graft duplex.  Thank you for allowing [place] to participate in this patient's care.  Adele Barthel, MD [facility] [place] Office: [phone] Pager: [phone]  03/22/2015, 1:48 PM

## 2015-05-09 DIAGNOSIS — Z0279 Encounter for issue of other medical certificate: Secondary | ICD-10-CM | POA: Diagnosis not present

## 2015-06-26 ENCOUNTER — Other Ambulatory Visit: Payer: Self-pay | Admitting: *Deleted

## 2015-06-26 ENCOUNTER — Encounter: Payer: Self-pay | Admitting: Vascular Surgery

## 2015-06-26 DIAGNOSIS — Z95828 Presence of other vascular implants and grafts: Secondary | ICD-10-CM

## 2015-06-26 DIAGNOSIS — I739 Peripheral vascular disease, unspecified: Secondary | ICD-10-CM

## 2015-06-26 DIAGNOSIS — Z48812 Encounter for surgical aftercare following surgery on the circulatory system: Secondary | ICD-10-CM

## 2015-06-28 ENCOUNTER — Ambulatory Visit (INDEPENDENT_AMBULATORY_CARE_PROVIDER_SITE_OTHER): Payer: BLUE CROSS/BLUE SHIELD | Admitting: Family

## 2015-06-28 ENCOUNTER — Ambulatory Visit (INDEPENDENT_AMBULATORY_CARE_PROVIDER_SITE_OTHER)
Admission: RE | Admit: 2015-06-28 | Discharge: 2015-06-28 | Disposition: A | Discharge status: Home / Self Care | Payer: BLUE CROSS/BLUE SHIELD | Source: Ambulatory Visit | Attending: Vascular Surgery | Admitting: Vascular Surgery

## 2015-06-28 ENCOUNTER — Ambulatory Visit (HOSPITAL_COMMUNITY)
Admission: RE | Admit: 2015-06-28 | Discharge: 2015-06-28 | Disposition: A | Discharge status: Home / Self Care | Payer: BLUE CROSS/BLUE SHIELD | Source: Ambulatory Visit | Attending: Vascular Surgery | Admitting: Vascular Surgery

## 2015-06-28 ENCOUNTER — Encounter: Payer: Self-pay | Admitting: Family

## 2015-06-28 ENCOUNTER — Other Ambulatory Visit: Payer: Self-pay

## 2015-06-28 VITALS — BP 156/81 | HR 57 | Temp 97.2°F | Resp 14 | Ht 71.0 in | Wt 189.0 lb

## 2015-06-28 DIAGNOSIS — Z720 Tobacco use: Secondary | ICD-10-CM | POA: Diagnosis not present

## 2015-06-28 DIAGNOSIS — I998 Other disorder of circulatory system: Secondary | ICD-10-CM | POA: Diagnosis not present

## 2015-06-28 DIAGNOSIS — Z95828 Presence of other vascular implants and grafts: Secondary | ICD-10-CM

## 2015-06-28 DIAGNOSIS — I779 Disorder of arteries and arterioles, unspecified: Secondary | ICD-10-CM

## 2015-06-28 DIAGNOSIS — I771 Stricture of artery: Secondary | ICD-10-CM | POA: Diagnosis not present

## 2015-06-28 DIAGNOSIS — Z48812 Encounter for surgical aftercare following surgery on the circulatory system: Secondary | ICD-10-CM | POA: Insufficient documentation

## 2015-06-28 DIAGNOSIS — I739 Peripheral vascular disease, unspecified: Secondary | ICD-10-CM

## 2015-06-28 DIAGNOSIS — I70229 Atherosclerosis of native arteries of extremities with rest pain, unspecified extremity: Secondary | ICD-10-CM

## 2015-06-28 DIAGNOSIS — F17200 Nicotine dependence, unspecified, uncomplicated: Secondary | ICD-10-CM

## 2015-06-28 NOTE — Progress Notes (Signed)
[facility] [place] HISTORY AND PHYSICAL -PAD  History of Present Illness Austin Espinoza is a 59 y.o. male patient of Dr. Bridgett Larsson who presents s/p L-to-R fem-fem BPG on 4/69/62 complicated by thrombosis leading redo fem-fem bypass on the table.  He returns today for follow up of his PAOD. He has returned to work, works nights at [employer], states he walks most of the night.  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 He also describes a heavy/tired feeling in his hips and thighs after walking a short distance, relieved by a short rest. He denies non healing wounds. He denies rest pain.  He denies any history of stroke or TIA.  The patient denies New Medical or Surgical History.  Pt Diabetic: Yes, on metformin only Pt smoker: smoker  (1/3 ppd, started in his teens)  Pt meds include: Statin :Yes Betablocker: Yes ASA: Yes Other anticoagulants/antiplatelets: coumadin (suggested by Dr. Bridgett Larsson, given the marginal flow in the fem-fem graft)  Past Medical History  Diagnosis Date  . CAD (coronary artery disease)     Bare-metal stent, LAD, 2007, total RCA, right to right collaterals  . Ejection fraction < 50%     EF 40% in the past  . Dyslipidemia   . Tobacco abuse   . Hypertension   . PAD (peripheral artery disease)     Arterial leg Doppler, December 29, 2011, greater than 50% focal stenosis of the left mid S.FA, right ABI mild range left ABI moderate range  //   consultation by Dr. Angelena Form, Jan 19, 2012. CTA with severe right CIA stenosis, bil SFA.   . Type II diabetes mellitus   . Hyperlipemia   . Anemia   . History of blood transfusion 01/29/2015    post op    Social History Social History  Substance Use Topics  . Smoking status: Former Smoker -- 0.50 packs/day for 45 years    Types: Cigarettes     Quit date: 01/14/2015  . Smokeless tobacco: Never Used  . Alcohol Use: No    Family History Family History  Problem Relation Age of Onset  . Heart attack Father   . Heart disease Father   . Diabetes Mother   . Cancer Brother   . Hypertension Sister     Past Surgical History  Procedure Laterality Date  . Abdominal aortagram N/A 04/06/2012    Procedure: ABDOMINAL Maxcine Ham;  Surgeon: Burnell Blanks, MD;  Location: [facility] CATH LAB;  Service: Cardiovascular;  Laterality: N/A;  . Peripheral vascular catheterization N/A 01/14/2015    Procedure: Abdominal Aortogram;  Surgeon: Conrad [place] Lake, MD;  Location: [facility];  Service: Cardiovascular;  Laterality: N/A;  . Peripheral vascular catheterization Bilateral 01/14/2015    Procedure: Lower Extremity Angiography;  Surgeon: Conrad Alsen, MD;  Location: [facility];  Service: Cardiovascular;  Laterality: Bilateral;  . Colonoscopy w/ polypectomy    . Femoral-femoral bypass graft Bilateral 01/29/2015    Procedure: BYPASS GRAFT LEFT FEMORAL ARTERY TO RIGHT FEMORAL ARTERY USING GORE PROPATEN GRAFT;  Surgeon: Conrad Wise, MD;  Location: [facility];  Service: Vascular;  Laterality: Bilateral;  . Endarterectomy femoral Bilateral 01/29/2015    Procedure: ENDARTERECTOMY RIGHT FEMORAL ARTERY AND LEFT FEMORAL ARTERY;  Surgeon: Conrad Hytop, MD;  Location: [facility];  Service: Vascular;  Laterality: Bilateral;  . Intraoperative arteriogram Left 01/29/2015    Procedure: INTRA OPERATIVE ARTERIOGRAM, LEFT ILIAC ARTERY;  Surgeon: Conrad Wilkes[place], MD;  Location: [facility];  Service: Vascular;  Laterality: Left;  . Patch angioplasty Left 01/29/2015    Procedure: LEFT FEMORAL ARTERY PATCH ANGIOPLASTY USING VASCUGUARD PATCH;  Surgeon: Conrad Vermillion, MD;  Location: [facility];  Service: Vascular;  Laterality: Left;  . Thrombectomy femoral artery Left 01/29/2015    Procedure: THROMBECTOMY LEFT FEMORAL ARTERY;  Surgeon: Conrad [place], MD;  Location: [facility];  Service: Vascular;   Laterality: Left;  . Coronary angioplasty with stent placement      X 1 stent    No Known Allergies  Current Outpatient Prescriptions  Medication Sig Dispense Refill  . aspirin 81 MG tablet Take 1 tablet (81 mg total) by mouth daily. 30 tablet 11  . atenolol (TENORMIN) 25 MG tablet TAKE ONE TABLET BY MOUTH ONCE DAILY (Patient taking differently: Take 25 mg by mouth once daily) 30 tablet 0  . Cholecalciferol (VITAMIN D) 2000 UNITS CAPS Take 2,000 Units by mouth daily.     . ciprofloxacin (CIPRO) 500 MG tablet Take 500 mg by mouth 2 (two) times daily.     . diazepam (VALIUM) 5 MG tablet Take 5 mg by mouth every 6 (six) hours as needed for anxiety.    . enoxaparin (LOVENOX) 80 MG/0.8ML injection Inject 0.8 mLs (80 mg total) into the skin every 12 (twelve) hours. 14 Syringe 1  . gabapentin (NEURONTIN) 300 MG capsule Take 300 mg by mouth 3 (three) times daily.    Marland Kitchen HYDROcodone-acetaminophen (NORCO/VICODIN) 5-325 MG per tablet Take 1 tablet by mouth every 6 (six) hours as needed for moderate pain.     Marland Kitchen lisinopril (PRINIVIL,ZESTRIL) 20 MG tablet TAKE ONE TABLET BY MOUTH ONCE DAILY. NEEDS  APPOINTMENT  FOR  FURTHER  REFILLS. (Patient taking differently: Take 20mg  by mouth once daily) 7 tablet 0  . metFORMIN (GLUCOPHAGE) 500 MG tablet Take 500 mg by mouth 2 (two) times daily with a meal.     . oxyCODONE-acetaminophen (PERCOCET/ROXICET) 5-325 MG per tablet Take 1-2 tablets by mouth every 6 (six) hours as needed for moderate pain. 30 tablet 0  . rosuvastatin (CRESTOR) 10 MG tablet Take 10 mg by mouth daily.    Marland Kitchen warfarin (COUMADIN) 1 MG tablet     . warfarin (COUMADIN) 7.5 MG tablet Take 1 tablet (7.5 mg total) by mouth daily. 30 tablet 3   No current facility-administered medications for this visit.    ROS: See HPI for pertinent positives and negatives.   Physical Examination  Filed Vitals:   06/28/15 1047 06/28/15 1055  BP: 153/78 156/81  Pulse: 58 57  Temp: 97.2 F (36.2 C)   TempSrc:  Oral   Resp: 14   Height: 5\' 11"  (1.803 m)   Weight: 189 lb (85.73 kg)   SpO2: 100%    Body mass index is 26.37 kg/(m^2).   General: A&O x 3, WDWN  Head: [place]/AT  Eyes: PERRLA  Pulmonary: Sym exp, limited air movt, CTAB, no rales, rhonchi, or wheezing  Cardiac: RRR, Nl S1, S2, no detectable murmur. Left to right fem to fem graft is not palpable.  Vascular: Vessel Right Left  Radial Palpable Palpable  Ulnar Not Palpable Not Palpable  Carotid Palpable, without bruit Palpable, without bruit  Aorta Not palpable N/A  Femoral Not Palpable Not Palpable  Popliteal Not palpable Not palpable  PT Not Palpable Not Palpable  DP faintly Palpable faintly Palpable   Gastrointestinal: soft, NTND, -G/R, - HSM, - palpable masses, - CVAT B  Musculoskeletal: M/S 5/5 throughout, extremities without ischemic changes.  Neurologic: CN 2-12 intact , Pain and light touch intact in extremities except decreased touch in R>L feet, Motor exam as listed above  Psychiatric: Judgment intact, Mood & affect appropriate for pt's clinical situation  Dermatologic: See M/S exam for extremity exam, no rashes otherwise noted           Non-Invasive Vascular Imaging: DATE: 06/28/2015 ABI: RIGHT: 0.71 (0.56, 01/31/15), Waveforms: monophasic, TBI: 0.58;  LEFT: 0.67 (0.57), Waveforms: monophasic, TBI: 0.63 DUPLEX SCAN OF BYPASS: Patent fem-fem graft with significant (>75%) of left iliac/femoral inflow. Inflow velocities of 254, 332, 583, and 474 cm/s.   ASSESSMENT: Basim Bartnik is a 59 y.o. male who is s/p L-to-R fem-fem BPG on 0/17/51 complicated by thrombosis leading redo fem-fem bypass on the table. Dr. Bridgett Larsson imaged the left iliac system on the operating table, but no obvious lesion needed intervention; Dr. Bridgett Larsson noted that this raises concerns that his cardiac output is limited.  He has known lumbar spine issues which are currently under evaluation, he seems to have bilateral  radiculopathy symptoms. He also has a heavy/tired feeling in his hips and thighs after walking a short distance, relieved by a short rest. He has no signs of ischemia in his feet/legs.  Today's bilateral aortoiliac duplex suggests a patent fem-fem graft with significant (>75%) of left iliac/femoral inflow. Inflow velocities of 254, 332, 583, and 474 cm/s. Bilateral ABI's improved to moderate arterial occlusive disease.  Pt 's DM seems to be in good control. Unfortunately he continues to smoke but seems receptive to quitting, see Plan.  Face to face time with patient was 25 minutes. Over 50% of this time was spent on counseling and coordination of care.   PLAN:  Based on the patient's vascular studies and examination, and after discussing with Dr. Bridgett Larsson, pt will be scheduled for arteriogram with bilateral LE run off, possible intervention by Dr. Bridgett Larsson in 2-3 weeks.  The patient was counseled re smoking cessation and given several free resources re smoking cessation.  I discussed in depth with the patient the nature of atherosclerosis, and emphasized the importance of maximal medical management including strict control of blood pressure, blood glucose, and lipid levels, obtaining regular exercise, and cessation of smoking.  The patient is aware that without maximal medical management the underlying atherosclerotic disease process will progress, limiting the benefit of any interventions.  The patient was given information about PAD including signs, symptoms, treatment, what symptoms should prompt the patient to seek immediate medical care, and risk reduction measures to take.  Clemon Chambers, RN, MSN, FNP-C [facility] Phone: [phone]  Clinic MD: Bridgett Larsson  06/28/2015 10:03 AM

## 2015-06-28 NOTE — Patient Instructions (Signed)
Peripheral Vascular Disease Peripheral vascular disease (PVD) is a disease of the blood vessels that are not part of your heart and brain. A simple term for PVD is poor circulation. In most cases, PVD narrows the blood vessels that carry blood from your heart to the rest of your body. This can result in a decreased supply of blood to your arms, legs, and internal organs, like your stomach or kidneys. However, it most often affects a person's lower legs and feet. There are two types of PVD.  Organic PVD. This is the more common type. It is caused by damage to the structure of blood vessels.  Functional PVD. This is caused by conditions that make blood vessels contract and tighten (spasm). Without treatment, PVD tends to get worse over time. PVD can also lead to acute ischemic limb. This is when an arm or limb suddenly has trouble getting enough blood. This is a medical emergency. CAUSES Each type of PVD has many different causes. The most common cause of PVD is buildup of a fatty material (plaque) inside of your arteries (atherosclerosis). Small amounts of plaque can break off from the walls of the blood vessels and become lodged in a smaller artery. This blocks blood flow and can cause acute ischemic limb. Other common causes of PVD include:  Blood clots that form inside of blood vessels.  Injuries to blood vessels.  Diseases that cause inflammation of blood vessels or cause blood vessel spasms.  Health behaviors and health history that increase your risk of developing PVD. RISK FACTORS  You may have a greater risk of PVD if you:  Have a family history of PVD.  Have certain medical conditions, including:  High cholesterol.  Diabetes.  High blood pressure (hypertension).  Coronary heart disease.  Past problems with blood clots.  Past injury, such as burns or a broken bone. These may have damaged blood vessels in your limbs.  Buerger disease. This is caused by inflamed blood  vessels in your hands and feet.  Some forms of arthritis.  Rare birth defects that affect the arteries in your legs.  Use tobacco.  Do not get enough exercise.  Are obese.  Are age 50 or older. SIGNS AND SYMPTOMS  PVD may cause many different symptoms. Your symptoms depend on what part of your body is not getting enough blood. Some common signs and symptoms include:  Cramps in your lower legs. This may be a symptom of poor leg circulation (claudication).  Pain and weakness in your legs while you are physically active that goes away when you rest (intermittent claudication).  Leg pain when at rest.  Leg numbness, tingling, or weakness.  Coldness in a leg or foot, especially when compared with the other leg.  Skin or hair changes. These can include:  Hair loss.  Shiny skin.  Pale or bluish skin.  Thick toenails.  Inability to get or maintain an erection (erectile dysfunction). People with PVD are more prone to developing ulcers and sores on their toes, feet, or legs. These may take longer than normal to heal. DIAGNOSIS Your health care provider may diagnose PVD from your signs and symptoms. The health care provider will also do a physical exam. You may have tests to find out what is causing your PVD and determine its severity. Tests may include:  Blood pressure recordings from your arms and legs and measurements of the strength of your pulses (pulse volume recordings).  Imaging studies using sound waves to take pictures of   the blood flow through your blood vessels (Doppler ultrasound).  Injecting a dye into your blood vessels before having imaging studies using:  X-rays (angiogram or arteriogram).  Computer-generated X-rays (CT angiogram).  A powerful electromagnetic field and a computer (magnetic resonance angiogram or MRA). TREATMENT Treatment for PVD depends on the cause of your condition and the severity of your symptoms. It also depends on your age. Underlying  causes need to be treated and controlled. These include long-lasting (chronic) conditions, such as diabetes, high cholesterol, and high blood pressure. You may need to first try making lifestyle changes and taking medicines. Surgery may be needed if these do not work. Lifestyle changes may include:  Quitting smoking.  Exercising regularly.  Following a low-fat, low-cholesterol diet. Medicines may include:  Blood thinners to prevent blood clots.  Medicines to improve blood flow.  Medicines to improve your blood cholesterol levels. Surgical procedures may include:  A procedure that uses an inflated balloon to open a blocked artery and improve blood flow (angioplasty).  A procedure to put in a tube (stent) to keep a blocked artery open (stent implant).  Surgery to reroute blood flow around a blocked artery (peripheral bypass surgery).  Surgery to remove dead tissue from an infected wound on the affected limb.  Amputation. This is surgical removal of the affected limb. This may be necessary in cases of acute ischemic limb that are not improved through medical or surgical treatments. HOME CARE INSTRUCTIONS  Take medicines only as directed by your health care provider.  Do not use any tobacco products, including cigarettes, chewing tobacco, or electronic cigarettes. If you need help quitting, ask your health care provider.  Lose weight if you are overweight, and maintain a healthy weight as directed by your health care provider.  Eat a diet that is low in fat and cholesterol. If you need help, ask your health care provider.  Exercise regularly. Ask your health care provider to suggest some good activities for you.  Use compression stockings or other mechanical devices as directed by your health care provider.  Take good care of your feet.  Wear comfortable shoes that fit well.  Check your feet often for any cuts or sores. SEEK MEDICAL CARE IF:  You have cramps in your legs  while walking.  You have leg pain when you are at rest.  You have coldness in a leg or foot.  Your skin changes.  You have erectile dysfunction.  You have cuts or sores on your feet that are not healing. SEEK IMMEDIATE MEDICAL CARE IF:  Your arm or leg turns cold and blue.  Your arms or legs become red, warm, swollen, painful, or numb.  You have chest pain or trouble breathing.  You suddenly have weakness in your face, arm, or leg.  You become very confused or lose the ability to speak.  You suddenly have a very bad headache or lose your vision.   This information is not intended to replace advice given to you by your health care provider. Make sure you discuss any questions you have with your health care provider.   Document Released: 09/24/2004 Document Revised: 09/07/2014 Document Reviewed: 01/25/2014 Elsevier Interactive Patient Education 2016 Elsevier Inc.     Steps to Quit Smoking  Smoking tobacco can be harmful to your health and can affect almost every organ in your body. Smoking puts you, and those around you, at risk for developing many serious chronic diseases. Quitting smoking is difficult, but it is one of   the best things that you can do for your health. It is never too late to quit. WHAT ARE THE BENEFITS OF QUITTING SMOKING? When you quit smoking, you lower your risk of developing serious diseases and conditions, such as:  Lung cancer or lung disease, such as COPD.  Heart disease.  Stroke.  Heart attack.  Infertility.  Osteoporosis and bone fractures. Additionally, symptoms such as coughing, wheezing, and shortness of breath may get better when you quit. You may also find that you get sick less often because your body is stronger at fighting off colds and infections. If you are pregnant, quitting smoking can help to reduce your chances of having a baby of low birth weight. HOW DO I GET READY TO QUIT? When you decide to quit smoking, create a plan to  make sure that you are successful. Before you quit:  Pick a date to quit. Set a date within the next two weeks to give you time to prepare.  Write down the reasons why you are quitting. Keep this list in places where you will see it often, such as on your bathroom mirror or in your car or wallet.  Identify the people, places, things, and activities that make you want to smoke (triggers) and avoid them. Make sure to take these actions:  Throw away all cigarettes at home, at work, and in your car.  Throw away smoking accessories, such as ashtrays and lighters.  Clean your car and make sure to empty the ashtray.  Clean your home, including curtains and carpets.  Tell your family, friends, and coworkers that you are quitting. Support from your loved ones can make quitting easier.  Talk with your health care provider about your options for quitting smoking.  Find out what treatment options are covered by your health insurance. WHAT STRATEGIES CAN I USE TO QUIT SMOKING?  Talk with your healthcare provider about different strategies to quit smoking. Some strategies include:  Quitting smoking altogether instead of gradually lessening how much you smoke over a period of time. Research shows that quitting "cold turkey" is more successful than gradually quitting.  Attending in-person counseling to help you build problem-solving skills. You are more likely to have success in quitting if you attend several counseling sessions. Even short sessions of 10 minutes can be effective.  Finding resources and support systems that can help you to quit smoking and remain smoke-free after you quit. These resources are most helpful when you use them often. They can include:  Online chats with a counselor.  Telephone quitlines.  Printed self-help materials.  Support groups or group counseling.  Text messaging programs.  Mobile phone applications.  Taking medicines to help you quit smoking. (If you are  pregnant or breastfeeding, talk with your health care provider first.) Some medicines contain nicotine and some do not. Both types of medicines help with cravings, but the medicines that include nicotine help to relieve withdrawal symptoms. Your health care provider may recommend:  Nicotine patches, gum, or lozenges.  Nicotine inhalers or sprays.  Non-nicotine medicine that is taken by mouth. Talk with your health care provider about combining strategies, such as taking medicines while you are also receiving in-person counseling. Using these two strategies together makes you more likely to succeed in quitting than if you used either strategy on its own. If you are pregnant or breastfeeding, talk with your health care provider about finding counseling or other support strategies to quit smoking. Do not take medicine to help you   quit smoking unless told to do so by your health care provider. WHAT THINGS CAN I DO TO MAKE IT EASIER TO QUIT? Quitting smoking might feel overwhelming at first, but there is a lot that you can do to make it easier. Take these important actions:  Reach out to your family and friends and ask that they support and encourage you during this time. Call telephone quitlines, reach out to support groups, or work with a counselor for support.  Ask people who smoke to avoid smoking around you.  Avoid places that trigger you to smoke, such as bars, parties, or smoke-break areas at work.  Spend time around people who do not smoke.  Lessen stress in your life, because stress can be a smoking trigger for some people. To lessen stress, try:  Exercising regularly.  Deep-breathing exercises.  Yoga.  Meditating.  Performing a body scan. This involves closing your eyes, scanning your body from head to toe, and noticing which parts of your body are particularly tense. Purposefully relax the muscles in those areas.  Download or purchase mobile phone or tablet apps (applications)  that can help you stick to your quit plan by providing reminders, tips, and encouragement. There are many free apps, such as QuitGuide from the CDC (Centers for Disease Control and Prevention). You can find other support for quitting smoking (smoking cessation) through smokefree.gov and other websites. HOW WILL I FEEL WHEN I QUIT SMOKING? Within the first 24 hours of quitting smoking, you may start to feel some withdrawal symptoms. These symptoms are usually most noticeable 2-3 days after quitting, but they usually do not last beyond 2-3 weeks. Changes or symptoms that you might experience include:  Mood swings.  Restlessness, anxiety, or irritation.  Difficulty concentrating.  Dizziness.  Strong cravings for sugary foods in addition to nicotine.  Mild weight gain.  Constipation.  Nausea.  Coughing or a sore throat.  Changes in how your medicines work in your body.  A depressed mood.  Difficulty sleeping (insomnia). After the first 2-3 weeks of quitting, you may start to notice more positive results, such as:  Improved sense of smell and taste.  Decreased coughing and sore throat.  Slower heart rate.  Lower blood pressure.  Clearer skin.  The ability to breathe more easily.  Fewer sick days. Quitting smoking is very challenging for most people. Do not get discouraged if you are not successful the first time. Some people need to make many attempts to quit before they achieve long-term success. Do your best to stick to your quit plan, and talk with your health care provider if you have any questions or concerns.   This information is not intended to replace advice given to you by your health care provider. Make sure you discuss any questions you have with your health care provider.   Document Released: 08/11/2001 Document Revised: 01/01/2015 Document Reviewed: 01/01/2015 Elsevier Interactive Patient Education 2016 Elsevier Inc.  

## 2015-06-28 NOTE — Progress Notes (Signed)
Filed Vitals:   06/28/15 1047 06/28/15 1055  BP: 153/78 156/81  Pulse: 58 57  Temp: 97.2 F (36.2 C)   TempSrc: Oral   Resp: 14   Height: 5\' 11"  (1.803 m)   Weight: 189 lb (85.73 kg)   SpO2: 100%

## 2015-07-16 DIAGNOSIS — Z0279 Encounter for issue of other medical certificate: Secondary | ICD-10-CM | POA: Diagnosis not present

## 2015-07-18 ENCOUNTER — Ambulatory Visit (HOSPITAL_COMMUNITY)
Admission: RE | Admit: 2015-07-18 | Discharge: 2015-07-18 | Disposition: A | Discharge status: Home / Self Care | Payer: BLUE CROSS/BLUE SHIELD | Source: Ambulatory Visit | Attending: Vascular Surgery | Admitting: Vascular Surgery

## 2015-07-18 ENCOUNTER — Encounter (HOSPITAL_COMMUNITY)
Admission: RE | Disposition: A | Discharge status: Home / Self Care | Payer: BLUE CROSS/BLUE SHIELD | Source: Ambulatory Visit | Attending: Vascular Surgery

## 2015-07-18 DIAGNOSIS — I10 Essential (primary) hypertension: Secondary | ICD-10-CM | POA: Insufficient documentation

## 2015-07-18 DIAGNOSIS — Z7984 Long term (current) use of oral hypoglycemic drugs: Secondary | ICD-10-CM | POA: Insufficient documentation

## 2015-07-18 DIAGNOSIS — Z7982 Long term (current) use of aspirin: Secondary | ICD-10-CM | POA: Diagnosis not present

## 2015-07-18 DIAGNOSIS — I70213 Atherosclerosis of native arteries of extremities with intermittent claudication, bilateral legs: Secondary | ICD-10-CM | POA: Diagnosis present

## 2015-07-18 DIAGNOSIS — D649 Anemia, unspecified: Secondary | ICD-10-CM | POA: Insufficient documentation

## 2015-07-18 DIAGNOSIS — Z955 Presence of coronary angioplasty implant and graft: Secondary | ICD-10-CM | POA: Diagnosis not present

## 2015-07-18 DIAGNOSIS — E119 Type 2 diabetes mellitus without complications: Secondary | ICD-10-CM | POA: Diagnosis not present

## 2015-07-18 DIAGNOSIS — Z7901 Long term (current) use of anticoagulants: Secondary | ICD-10-CM | POA: Diagnosis not present

## 2015-07-18 DIAGNOSIS — E785 Hyperlipidemia, unspecified: Secondary | ICD-10-CM | POA: Diagnosis not present

## 2015-07-18 DIAGNOSIS — Z87891 Personal history of nicotine dependence: Secondary | ICD-10-CM | POA: Insufficient documentation

## 2015-07-18 DIAGNOSIS — I2510 Atherosclerotic heart disease of native coronary artery without angina pectoris: Secondary | ICD-10-CM | POA: Diagnosis not present

## 2015-07-18 DIAGNOSIS — I70229 Atherosclerosis of native arteries of extremities with rest pain, unspecified extremity: Secondary | ICD-10-CM | POA: Diagnosis present

## 2015-07-18 DIAGNOSIS — Z7902 Long term (current) use of antithrombotics/antiplatelets: Secondary | ICD-10-CM | POA: Diagnosis not present

## 2015-07-18 DIAGNOSIS — I739 Peripheral vascular disease, unspecified: Secondary | ICD-10-CM | POA: Diagnosis present

## 2015-07-18 DIAGNOSIS — I743 Embolism and thrombosis of arteries of the lower extremities: Secondary | ICD-10-CM | POA: Diagnosis not present

## 2015-07-18 DIAGNOSIS — Z8249 Family history of ischemic heart disease and other diseases of the circulatory system: Secondary | ICD-10-CM | POA: Diagnosis not present

## 2015-07-18 DIAGNOSIS — I998 Other disorder of circulatory system: Secondary | ICD-10-CM | POA: Diagnosis present

## 2015-07-18 HISTORY — PX: PERIPHERAL VASCULAR CATHETERIZATION: SHX172C

## 2015-07-18 LAB — POCT ACTIVATED CLOTTING TIME
ACTIVATED CLOTTING TIME: 275 s
ACTIVATED CLOTTING TIME: 202 s
Activated Clotting Time: 177 seconds

## 2015-07-18 LAB — POCT I-STAT, CHEM 8
BUN: 11 mg/dL (ref 6–20)
CREATININE: 1 mg/dL (ref 0.61–1.24)
Calcium, Ion: 1.3 mmol/L (ref 1.13–1.30)
Chloride: 106 mmol/L (ref 101–111)
Glucose, Bld: 94 mg/dL (ref 65–99)
HEMATOCRIT: 37 % — AB (ref 39.0–52.0)
HEMOGLOBIN: 12.6 g/dL — AB (ref 13.0–17.0)
Potassium: 3.9 mmol/L (ref 3.5–5.1)
Sodium: 142 mmol/L (ref 135–145)
TCO2: 22 mmol/L (ref 0–100)

## 2015-07-18 LAB — GLUCOSE, CAPILLARY
GLUCOSE-CAPILLARY: 97 mg/dL (ref 65–99)
GLUCOSE-CAPILLARY: 86 mg/dL (ref 65–99)

## 2015-07-18 LAB — PROTIME-INR
INR: 1.17 (ref 0.00–1.49)
PROTHROMBIN TIME: 15.1 s (ref 11.6–15.2)

## 2015-07-18 SURGERY — ABDOMINAL AORTOGRAM
Anesthesia: LOCAL

## 2015-07-18 MED ORDER — HYDRALAZINE HCL 20 MG/ML IJ SOLN
10.0000 mg | Freq: Once | INTRAMUSCULAR | Status: AC
  Administered 2015-07-18: 10 mg via INTRAVENOUS

## 2015-07-18 MED ORDER — SODIUM CHLORIDE 0.9 % IV SOLN: 1.0000 mL/kg/h | INTRAVENOUS | Status: DC

## 2015-07-18 MED ORDER — HYDRALAZINE HCL 20 MG/ML IJ SOLN
INTRAMUSCULAR | Status: AC
  Administered 2015-07-18: 10 mg via INTRAVENOUS
  Filled 2015-07-18: qty 1

## 2015-07-18 MED ORDER — IODIXANOL 320 MG/ML IV SOLN
INTRAVENOUS | Status: DC | PRN
  Administered 2015-07-18: 180 mL via INTRA_ARTERIAL

## 2015-07-18 MED ORDER — HEPARIN (PORCINE) IN NACL 2-0.9 UNIT/ML-% IJ SOLN
INTRAMUSCULAR | Status: AC
  Filled 2015-07-18: qty 1000

## 2015-07-18 MED ORDER — LIDOCAINE HCL (PF) 1 % IJ SOLN
INTRAMUSCULAR | Status: DC | PRN
  Administered 2015-07-18: 09:00:00

## 2015-07-18 MED ORDER — SODIUM CHLORIDE 0.9 % IV SOLN
INTRAVENOUS | Status: DC
  Administered 2015-07-18: 06:00:00 via INTRAVENOUS

## 2015-07-18 MED ORDER — FENTANYL CITRATE (PF) 100 MCG/2ML IJ SOLN
INTRAMUSCULAR | Status: DC | PRN
  Administered 2015-07-18: 50 ug via INTRAVENOUS

## 2015-07-18 MED ORDER — OXYCODONE-ACETAMINOPHEN 5-325 MG PO TABS: 1.0000 | ORAL_TABLET | Freq: Four times a day (QID) | ORAL | Status: DC | PRN

## 2015-07-18 MED ORDER — HEPARIN SODIUM (PORCINE) 1000 UNIT/ML IJ SOLN
INTRAMUSCULAR | Status: AC
  Filled 2015-07-18: qty 1

## 2015-07-18 MED ORDER — MIDAZOLAM HCL 2 MG/2ML IJ SOLN
INTRAMUSCULAR | Status: AC
  Filled 2015-07-18: qty 2

## 2015-07-18 MED ORDER — MIDAZOLAM HCL 2 MG/2ML IJ SOLN
INTRAMUSCULAR | Status: DC | PRN
  Administered 2015-07-18: 1 mg via INTRAVENOUS

## 2015-07-18 MED ORDER — FENTANYL CITRATE (PF) 100 MCG/2ML IJ SOLN
INTRAMUSCULAR | Status: AC
  Filled 2015-07-18: qty 2

## 2015-07-18 MED ORDER — ACETAMINOPHEN 325 MG PO TABS: 650.0000 mg | ORAL_TABLET | ORAL | Status: DC | PRN

## 2015-07-18 MED ORDER — LIDOCAINE HCL (PF) 1 % IJ SOLN
INTRAMUSCULAR | Status: AC
Start: 2015-07-18 — End: 2015-07-18
  Filled 2015-07-18: qty 60

## 2015-07-18 SURGICAL SUPPLY — 18 items
BALLOON POWERFLX PRO 6X60X80 (BALLOONS) ×1 IMPLANT
CATH OMNI FLUSH 5F 65CM (CATHETERS) ×1 IMPLANT
COVER PRB 48X5XTLSCP FOLD TPE (BAG) IMPLANT
COVER PROBE 5X48 (BAG) ×2
KIT ENCORE 26 ADVANTAGE (KITS) ×1 IMPLANT
KIT MICROINTRODUCER STIFF 5F (SHEATH) ×1 IMPLANT
KIT PV (KITS) ×2 IMPLANT
SHEATH BRITE TIP 7FR 35CM (SHEATH) ×1 IMPLANT
SHEATH PINNACLE 5F 10CM (SHEATH) ×1 IMPLANT
SHEATH PINNACLE 7F 10CM (SHEATH) ×1 IMPLANT
STENT ABSOLUTE PRO 6X80X135 (Permanent Stent) ×1 IMPLANT
SYR MEDRAD MARK V 150ML (SYRINGE) ×2 IMPLANT
TAPE RADIOPAQUE TURBO (MISCELLANEOUS) ×1 IMPLANT
TRANSDUCER W/STOPCOCK (MISCELLANEOUS) ×2 IMPLANT
TRAY PV CATH (CUSTOM PROCEDURE TRAY) ×2 IMPLANT
WIRE AMPLATZ SS-J .035X180CM (WIRE) ×1 IMPLANT
WIRE BENTSON .035X145CM (WIRE) ×1 IMPLANT
WIRE ROSEN-J .035X260CM (WIRE) ×1 IMPLANT

## 2015-07-18 NOTE — H&P (View-Only) (Signed)
[facility] [place] HISTORY AND PHYSICAL -PAD  History of Present Illness Austin Espinoza is a 59 y.o. male patient of Dr. Chen who presents s/p L-to-R fem-fem BPG on 01/29/15 complicated by thrombosis leading redo fem-fem bypass on the table.  He returns today for follow up of his PAOD. He has returned to work, works nights at [employer], states he walks most of the night.  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 He also describes a heavy/tired feeling in his hips and thighs after walking a short distance, relieved by a short rest. He denies non healing wounds. He denies rest pain.  He denies any history of stroke or TIA.  The patient denies New Medical or Surgical History.  Pt Diabetic: Yes, on metformin only Pt smoker: smoker  (1/3 ppd, started in his teens)  Pt meds include: Statin :Yes Betablocker: Yes ASA: Yes Other anticoagulants/antiplatelets: coumadin (suggested by Dr. Chen, given the marginal flow in the fem-fem graft)  Past Medical History  Diagnosis Date  . CAD (coronary artery disease)     Bare-metal stent, LAD, 2007, total RCA, right to right collaterals  . Ejection fraction < 50%     EF 40% in the past  . Dyslipidemia   . Tobacco abuse   . Hypertension   . PAD (peripheral artery disease)     Arterial leg Doppler, December 29, 2011, greater than 50% focal stenosis of the left mid S.FA, right ABI mild range left ABI moderate range  //   consultation by Dr. McAlhany, Jan 19, 2012. CTA with severe right CIA stenosis, bil SFA.   . Type II diabetes mellitus   . Hyperlipemia   . Anemia   . History of blood transfusion 01/29/2015    post op    Social History Social History  Substance Use Topics  . Smoking status: Former Smoker -- 0.50 packs/day for 45 years    Types: Cigarettes     Quit date: 01/14/2015  . Smokeless tobacco: Never Used  . Alcohol Use: No    Family History Family History  Problem Relation Age of Onset  . Heart attack Father   . Heart disease Father   . Diabetes Mother   . Cancer Brother   . Hypertension Sister     Past Surgical History  Procedure Laterality Date  . Abdominal aortagram N/A 04/06/2012    Procedure: ABDOMINAL AORTAGRAM;  Surgeon: Christopher D McAlhany, MD;  Location: [facility] CATH [facility];  Service: Cardiovascular;  Laterality: N/A;  . Peripheral vascular catheterization N/A 01/14/2015    Procedure: Abdominal Aortogram;  Surgeon: Brian L Chen, MD;  Location: [facility] INVASIVE CV [facility];  Service: Cardiovascular;  Laterality: N/A;  . Peripheral vascular catheterization Bilateral 01/14/2015    Procedure: Lower Extremity Angiography;  Surgeon: Brian L Chen, MD;  Location: [facility];  Service: Cardiovascular;  Laterality: Bilateral;  . Colonoscopy w/ polypectomy    . Femoral-femoral bypass graft Bilateral 01/29/2015    Procedure: BYPASS GRAFT LEFT FEMORAL ARTERY TO RIGHT FEMORAL ARTERY USING GORE PROPATEN GRAFT;  Surgeon: Brian L Chen, MD;  Location: [facility] OR;  Service: Vascular;  Laterality: Bilateral;  . Endarterectomy femoral Bilateral 01/29/2015    Procedure: ENDARTERECTOMY RIGHT FEMORAL ARTERY AND LEFT FEMORAL ARTERY;  Surgeon: Brian L Chen, MD;  Location: [facility] OR;  Service: Vascular;    Laterality: Bilateral;  . Intraoperative arteriogram Left 01/29/2015    Procedure: INTRA OPERATIVE ARTERIOGRAM, LEFT ILIAC ARTERY;  Surgeon: Brian L Chen, MD;  Location: [facility] OR;  Service: Vascular;  Laterality: Left;  . Patch angioplasty Left 01/29/2015    Procedure: LEFT FEMORAL ARTERY PATCH ANGIOPLASTY USING VASCUGUARD PATCH;  Surgeon: Brian L Chen, MD;  Location: [facility] OR;  Service: Vascular;  Laterality: Left;  . Thrombectomy femoral artery Left 01/29/2015    Procedure: THROMBECTOMY LEFT FEMORAL ARTERY;  Surgeon: Brian L Chen, MD;  Location: [facility] OR;  Service: Vascular;   Laterality: Left;  . Coronary angioplasty with stent placement      X 1 stent    No Known Allergies  Current Outpatient Prescriptions  Medication Sig Dispense Refill  . aspirin 81 MG tablet Take 1 tablet (81 mg total) by mouth daily. 30 tablet 11  . atenolol (TENORMIN) 25 MG tablet TAKE ONE TABLET BY MOUTH ONCE DAILY (Patient taking differently: Take 25 mg by mouth once daily) 30 tablet 0  . Cholecalciferol (VITAMIN D) 2000 UNITS CAPS Take 2,000 Units by mouth daily.     . ciprofloxacin (CIPRO) 500 MG tablet Take 500 mg by mouth 2 (two) times daily.     . diazepam (VALIUM) 5 MG tablet Take 5 mg by mouth every 6 (six) hours as needed for anxiety.    . enoxaparin (LOVENOX) 80 MG/0.8ML injection Inject 0.8 mLs (80 mg total) into the skin every 12 (twelve) hours. 14 Syringe 1  . gabapentin (NEURONTIN) 300 MG capsule Take 300 mg by mouth 3 (three) times daily.    . HYDROcodone-acetaminophen (NORCO/VICODIN) 5-325 MG per tablet Take 1 tablet by mouth every 6 (six) hours as needed for moderate pain.     . lisinopril (PRINIVIL,ZESTRIL) 20 MG tablet TAKE ONE TABLET BY MOUTH ONCE DAILY. NEEDS  APPOINTMENT  FOR  FURTHER  REFILLS. (Patient taking differently: Take 20mg by mouth once daily) 7 tablet 0  . metFORMIN (GLUCOPHAGE) 500 MG tablet Take 500 mg by mouth 2 (two) times daily with a meal.     . oxyCODONE-acetaminophen (PERCOCET/ROXICET) 5-325 MG per tablet Take 1-2 tablets by mouth every 6 (six) hours as needed for moderate pain. 30 tablet 0  . rosuvastatin (CRESTOR) 10 MG tablet Take 10 mg by mouth daily.    . warfarin (COUMADIN) 1 MG tablet     . warfarin (COUMADIN) 7.5 MG tablet Take 1 tablet (7.5 mg total) by mouth daily. 30 tablet 3   No current facility-administered medications for this visit.    ROS: See HPI for pertinent positives and negatives.   Physical Examination  Filed Vitals:   06/28/15 1047 06/28/15 1055  BP: 153/78 156/81  Pulse: 58 57  Temp: 97.2 F (36.2 C)   TempSrc:  Oral   Resp: 14   Height: 5' 11" (1.803 m)   Weight: 189 lb (85.73 kg)   SpO2: 100%    Body mass index is 26.37 kg/(m^2).   General: A&O x 3, WDWN  Head: Hart/AT  Eyes: PERRLA  Pulmonary: Sym exp, limited air movt, CTAB, no rales, rhonchi, or wheezing  Cardiac: RRR, Nl S1, S2, no detectable murmur. Left to right fem to fem graft is not palpable.  Vascular: Vessel Right Left  Radial Palpable Palpable  Ulnar Not Palpable Not Palpable  Carotid Palpable, without bruit Palpable, without bruit  Aorta Not palpable N/A  Femoral Not Palpable Not Palpable  Popliteal Not palpable Not palpable  PT Not Palpable Not Palpable    DP faintly Palpable faintly Palpable   Gastrointestinal: soft, NTND, -G/R, - HSM, - palpable masses, - CVAT B  Musculoskeletal: M/S 5/5 throughout, extremities without ischemic changes.  Neurologic: CN 2-12 intact , Pain and light touch intact in extremities except decreased touch in R>L feet, Motor exam as listed above  Psychiatric: Judgment intact, Mood & affect appropriate for pt's clinical situation  Dermatologic: See M/S exam for extremity exam, no rashes otherwise noted           Non-Invasive Vascular Imaging: DATE: 06/28/2015 ABI: RIGHT: 0.71 (0.56, 01/31/15), Waveforms: monophasic, TBI: 0.58;  LEFT: 0.67 (0.57), Waveforms: monophasic, TBI: 0.63 DUPLEX SCAN OF BYPASS: Patent fem-fem graft with significant (>75%) of left iliac/femoral inflow. Inflow velocities of 254, 332, 583, and 474 cm/s.   ASSESSMENT: Besnik Fehringer is a 59 y.o. male who is s/p L-to-R fem-fem BPG on 01/29/15 complicated by thrombosis leading redo fem-fem bypass on the table. Dr. Chen imaged the left iliac system on the operating table, but no obvious lesion needed intervention; Dr. Chen noted that this raises concerns that his cardiac output is limited.  He has known lumbar spine issues which are currently under evaluation, he seems to have bilateral  radiculopathy symptoms. He also has a heavy/tired feeling in his hips and thighs after walking a short distance, relieved by a short rest. He has no signs of ischemia in his feet/legs.  Today's bilateral aortoiliac duplex suggests a patent fem-fem graft with significant (>75%) of left iliac/femoral inflow. Inflow velocities of 254, 332, 583, and 474 cm/s. Bilateral ABI's improved to moderate arterial occlusive disease.  Pt 's DM seems to be in good control. Unfortunately he continues to smoke but seems receptive to quitting, see Plan.  Face to face time with patient was 25 minutes. Over 50% of this time was spent on counseling and coordination of care.   PLAN:  Based on the patient's vascular studies and examination, and after discussing with Dr. Chen, pt will be scheduled for arteriogram with bilateral LE run off, possible intervention by Dr. Chen in 2-3 weeks.  The patient was counseled re smoking cessation and given several free resources re smoking cessation.  I discussed in depth with the patient the nature of atherosclerosis, and emphasized the importance of maximal medical management including strict control of blood pressure, blood glucose, and lipid levels, obtaining regular exercise, and cessation of smoking.  The patient is aware that without maximal medical management the underlying atherosclerotic disease process will progress, limiting the benefit of any interventions.  The patient was given information about PAD including signs, symptoms, treatment, what symptoms should prompt the patient to seek immediate medical care, and risk reduction measures to take.  Tremel Setters, RN, MSN, FNP-C [facility] [place] Office Phone: [phone]  Clinic MD: Chen  06/28/2015 10:03 AM    

## 2015-07-18 NOTE — Progress Notes (Signed)
Site area: left  Groin a 6 french sheath was removed  Site Prior to Removal:  Level 0  Pressure Applied For 20 MINUTES    Minutes Beginning at 1025a  Manual:   Yes.    Patient Status During Pull:  stable  Post Pull Groin Site:  Level 0  Post Pull Instructions Given:  Yes.    Post Pull Pulses Present:  Yes.    Dressing Applied:  Yes.    Comments:  VS remain stable during sheath pull

## 2015-07-18 NOTE — Interval H&P Note (Signed)
[facility] [place]  History and Physical Update  The patient was interviewed and re-examined.  The patient's previous History and Physical has been reviewed and is unchanged from my NP's consult.  There is no change in the plan of care: aortogram, left leg runoff, possible iliac intervention.  I discussed with the patient the nature of angiographic procedures, especially the limited patencies of any endovascular intervention.  The patient is aware of that the risks of an angiographic procedure include but are not limited to: bleeding, infection, access site complications, renal failure, embolization, rupture of vessel, dissection, possible need for emergent surgical intervention, possible need for surgical procedures to treat the patient's pathology, anaphylactic reaction to contrast, and stroke and death.  The patient is aware of the risks and agrees to proceed.   Adele Barthel, MD [facility] [place] Office: [phone] Pager: [phone]  07/18/2015, 7:24 AM

## 2015-07-18 NOTE — Discharge Instructions (Signed)
Excuse from Work, [employer], or Physical Activity ________________________[name]__________________________ needs to be excused from: ___x__ Work _____ [employer] ____x_ Physical activity beginning now and through the following date: ___11/22/2016_________________. _____ He or she may return to work or school but should still avoid the following physical activity or activities from now until ____________________. Activity restrictions include: _____ Lifting more than _______ lb _____ Sitting longer than __________ minutes at a time _____ Standing longer than ________ minutes at a time __x___ He or she may return to full physical activity as of _11/23/2016___________________. Health Care Provider Name (printed): ________________________________________  Health Care Provider (signature): ___________________________________________ Date: ________________   This information is not intended to replace advice given to you by your health care provider. Make sure you discuss any questions you have with your health care provider.   Document Released: 02/10/2001 Document Revised: 09/07/2014 Document Reviewed: 03/19/2014 Elsevier Interactive Patient Education [employer].

## 2015-07-19 ENCOUNTER — Encounter (HOSPITAL_COMMUNITY): Payer: Self-pay | Admitting: Vascular Surgery

## 2015-07-19 MED FILL — Heparin Sodium (Porcine) Inj 1000 Unit/ML: INTRAMUSCULAR | Qty: 10 | Status: AC

## 2015-07-22 ENCOUNTER — Telehealth: Payer: Self-pay | Admitting: Vascular Surgery

## 2015-07-22 NOTE — Telephone Encounter (Addendum)
-----   Message from Denman George, RN sent at 07/18/2015 10:53 AM EST ----- Regarding: Zigmund Daniel log; also needs f/u in 4 wks with BLC   ----- Message -----    From: Conrad Grenville, MD    Sent: 07/18/2015   9:27 AM      To: [facility] Charge Pool  Donivan Burau KI:[id number] 1955-12-30   PROCEDURE: 1.  Left common femoral artery cannulation under ultrasound guidance 2.  Placement of catheter in aorta 3.  Aortogram 4.  Angioplasty and stenting of left common iliac artery and external iliac artery (6 mm x 80 mm) 5.  Bilateral leg runoff   Follow-up: 4 weeks   notified patient of post op appt. with dr. Kellie Simmering on 09-16-14 at 10 am for lab, then 11 am to see dr. Lupita Raider

## 2015-08-21 ENCOUNTER — Encounter: Payer: Self-pay | Admitting: Vascular Surgery

## 2015-08-30 ENCOUNTER — Ambulatory Visit (INDEPENDENT_AMBULATORY_CARE_PROVIDER_SITE_OTHER): Payer: Self-pay | Admitting: Vascular Surgery

## 2015-08-30 ENCOUNTER — Encounter: Payer: Self-pay | Admitting: Vascular Surgery

## 2015-08-30 VITALS — BP 138/80 | HR 62 | Temp 98.8°F | Ht 69.0 in | Wt 189.9 lb

## 2015-08-30 DIAGNOSIS — I70229 Atherosclerosis of native arteries of extremities with rest pain, unspecified extremity: Secondary | ICD-10-CM

## 2015-08-30 DIAGNOSIS — I998 Other disorder of circulatory system: Secondary | ICD-10-CM

## 2015-08-30 NOTE — Addendum Note (Signed)
Addended by: Dorthula Rue L on: 08/30/2015 09:17 AM   Modules accepted: Orders

## 2015-08-30 NOTE — Progress Notes (Addendum)
Postoperative Visit   History of Present Illness  Austin Espinoza is a 59 y.o. male who presents for postoperative follow-up from procedure on Date: 07/18/15: 4. Angioplasty and stenting of left common iliac artery and external iliac artery (6 mm x 80 mm).  This was done for diseased L EIA only 2-3 mm in diameter.  This was stented with a 6 mm stent.  The patient notes no complications from the cannulation.  The patient notes continued resolution of lower extremity symptoms.  The patient is able to complete their activities of daily living.  The patient's current symptoms are: occasional pain in right groin incision.   Pt also underwent on 01/29/15: 1. Right common femoral artery endarterectomy 2. Left-to-right femorofemoral bypass with Dacron 3. Open cannulation of left common femoral artery  4. Left iliac angiogram 5. Thrombectomy of femorofemoral graft 6. Left iliofemoral endarterectomy with bovine patch angioplasty 7. Redo Left-to-right femorofemoral bypass with Propaten   Past Medical History, Past Surgical History, Social History, Family History, Medications, Allergies, and Review of Systems are unchanged from previous evaluation on 07/18/15.  On ROS: lumbago, right foot anesthesia  Current Outpatient Prescriptions  Medication Sig Dispense Refill  . aspirin 81 MG tablet Take 1 tablet (81 mg total) by mouth daily. 30 tablet 11  . atenolol (TENORMIN) 25 MG tablet TAKE ONE TABLET BY MOUTH ONCE DAILY (Patient taking differently: Take 25 mg by mouth once daily) 30 tablet 0  . Cholecalciferol (VITAMIN D) 2000 UNITS CAPS Take 2,000 Units by mouth daily.     . diazepam (VALIUM) 5 MG tablet Take 5 mg by mouth every 6 (six) hours as needed for anxiety.    . DUEXIS 800-26.6 MG TABS Take 1 tablet by mouth 3 (three) times daily.  0  . enoxaparin (LOVENOX) 80 MG/0.8ML injection Inject 0.8 mLs (80 mg total) into the skin every 12 (twelve) hours. 14 Syringe 1  . gabapentin (NEURONTIN) 300  MG capsule Take 300 mg by mouth 2 (two) times daily.     Marland Kitchen HYDROcodone-acetaminophen (NORCO/VICODIN) 5-325 MG per tablet Take 1 tablet by mouth every 6 (six) hours as needed for moderate pain.     Marland Kitchen lisinopril (PRINIVIL,ZESTRIL) 20 MG tablet TAKE ONE TABLET BY MOUTH ONCE DAILY. NEEDS  APPOINTMENT  FOR  FURTHER  REFILLS. (Patient taking differently: Take 20mg  by mouth once daily) 7 tablet 0  . metFORMIN (GLUCOPHAGE) 500 MG tablet Take 500 mg by mouth 2 (two) times daily with a meal.     . rosuvastatin (CRESTOR) 10 MG tablet Take 10 mg by mouth daily.    Marland Kitchen warfarin (COUMADIN) 5 MG tablet Take 5 mg by mouth daily.     No current facility-administered medications for this visit.    For VQI Use Only  PRE-ADM LIVING: Home  AMB STATUS: Ambulatory  Physical Examination  Filed Vitals:   08/30/15 0838 08/30/15 0839  BP: 152/81 138/80  Pulse: 62   Temp: 98.8 F (37.1 C)   TempSrc: Oral   Height: 5\' 9"  (1.753 m)   Weight: 189 lb 14.4 oz (86.138 kg)   SpO2: 99%    Body mass index is 28.03 kg/(m^2).  General: A&O x 3, WDWN  Pulmonary: Sym exp, good air movt, CTAB, no rales, rhonchi, & wheezing  Cardiac: RRR, Nl S1, S2, no Murmurs, rubs or gallops  Vascular: Vessel Right Left  Radial Palpable Palpable  Brachial Palpable Palpable  Carotid Palpable, without bruit Palpable, without bruit  Aorta Not palpable N/A  Femoral Palpable Palpable (improved)  Popliteal Not palpable Not palpable  PT Faintly Palpable Faintly Palpable  DP Faintly Palpable Faintly Palpable    Gastrointestinal: soft, NTND, -G/R, - HSM, - masses, - CVAT B  Musculoskeletal: M/S 5/5 throughout , Extremities without ischemic changes , B groin without hematoma, no echymosis present at cannulation site  Neurologic:  Pain and light touch intact in extremities except decreased in R foot, Motor exam as listed above   Medical Decision Making  Austin Espinoza is a 59 y.o. male who presents s/p L EIA PTA+S . Based on  his angiographic findings, this patient needs: q3 month surveillance with BLE ABI and aortoiliac duplex to evaluate the left iliac arterial system and fem-fem graft. I discussed in depth with the patient the nature of atherosclerosis, and emphasized the importance of maximal medical management including strict control of blood pressure, blood glucose, and lipid levels, obtaining regular exercise, and cessation of smoking.  The patient is aware that without maximal medical management the underlying atherosclerotic disease process will progress, limiting the benefit of any interventions. The patient is currently on a statin: Crestor. The patient is currently on an anti-platelet: ASA. Pt is also on Warfarin.  Thank you for allowing [place] to participate in this patient's care.  Adele Barthel, MD [facility] [place] Office: [phone] Pager: [phone]

## 2015-11-21 ENCOUNTER — Encounter: Payer: Self-pay | Admitting: Vascular Surgery

## 2015-11-21 HISTORY — PX: COLONOSCOPY W/ POLYPECTOMY: SHX1380

## 2015-11-29 ENCOUNTER — Ambulatory Visit (INDEPENDENT_AMBULATORY_CARE_PROVIDER_SITE_OTHER)
Admission: RE | Admit: 2015-11-29 | Discharge: 2015-11-29 | Disposition: A | Discharge status: Home / Self Care | Payer: BLUE CROSS/BLUE SHIELD | Source: Ambulatory Visit | Attending: Vascular Surgery | Admitting: Vascular Surgery

## 2015-11-29 ENCOUNTER — Other Ambulatory Visit: Payer: Self-pay | Admitting: *Deleted

## 2015-11-29 ENCOUNTER — Encounter: Payer: Self-pay | Admitting: Vascular Surgery

## 2015-11-29 ENCOUNTER — Ambulatory Visit (HOSPITAL_COMMUNITY)
Admission: RE | Admit: 2015-11-29 | Discharge: 2015-11-29 | Disposition: A | Discharge status: Home / Self Care | Payer: BLUE CROSS/BLUE SHIELD | Source: Ambulatory Visit | Attending: Vascular Surgery | Admitting: Vascular Surgery

## 2015-11-29 ENCOUNTER — Ambulatory Visit (INDEPENDENT_AMBULATORY_CARE_PROVIDER_SITE_OTHER): Payer: BLUE CROSS/BLUE SHIELD | Admitting: Vascular Surgery

## 2015-11-29 VITALS — BP 155/79 | HR 69 | Ht 69.0 in | Wt 186.6 lb

## 2015-11-29 DIAGNOSIS — E119 Type 2 diabetes mellitus without complications: Secondary | ICD-10-CM | POA: Insufficient documentation

## 2015-11-29 DIAGNOSIS — I70229 Atherosclerosis of native arteries of extremities with rest pain, unspecified extremity: Secondary | ICD-10-CM

## 2015-11-29 DIAGNOSIS — E785 Hyperlipidemia, unspecified: Secondary | ICD-10-CM | POA: Insufficient documentation

## 2015-11-29 DIAGNOSIS — Z720 Tobacco use: Secondary | ICD-10-CM | POA: Insufficient documentation

## 2015-11-29 DIAGNOSIS — I739 Peripheral vascular disease, unspecified: Secondary | ICD-10-CM | POA: Insufficient documentation

## 2015-11-29 DIAGNOSIS — I2510 Atherosclerotic heart disease of native coronary artery without angina pectoris: Secondary | ICD-10-CM | POA: Diagnosis not present

## 2015-11-29 DIAGNOSIS — I10 Essential (primary) hypertension: Secondary | ICD-10-CM | POA: Diagnosis not present

## 2015-11-29 DIAGNOSIS — I998 Other disorder of circulatory system: Secondary | ICD-10-CM | POA: Insufficient documentation

## 2015-11-29 DIAGNOSIS — R0989 Other specified symptoms and signs involving the circulatory and respiratory systems: Secondary | ICD-10-CM | POA: Diagnosis present

## 2015-11-29 NOTE — Progress Notes (Signed)
Established Critical Limb Ischemia  History of Present Illness  Austin Espinoza is a 60 y.o. (1956/08/18) male  who presents for cc: routine follow up.  This patient's prior procedures include:  Date: 07/18/15:  1.  Angioplasty and stenting of left common iliac artery and external iliac artery (6 mm x 80 mm). This was done for diseased L EIA only 2-3 mm in diameter. This was stented with a 6 mm stent.   Pt also underwent on 01/29/15: 1. Right common femoral artery endarterectomy 2. Left-to-right femorofemoral bypass with Dacron 3. Open cannulation of left common femoral artery  4. Left iliac angiogram 5. Thrombectomy of femorofemoral graft 6. Left iliofemoral endarterectomy with bovine patch angioplasty 7. Redo Left-to-right femorofemoral bypass with Propaten  The patient notes continued resolution of lower extremity symptoms. The patient is able to complete their activities of daily living. The patient's current symptoms are: numbness in R toes.  He attributes this to his back pain.  Pt also has known DM controlled with metformin.  Past Medical History, Past Surgical History, Social History, Family History, Medications, Allergies, and Review of Systems are unchanged from previous evaluation on 08/30/15  On ROS: numbness in R toes, no intermittent claudication, no rest pain  Current Outpatient Prescriptions  Medication Sig Dispense Refill  . aspirin 81 MG tablet Take 1 tablet (81 mg total) by mouth daily. 30 tablet 11  . atenolol (TENORMIN) 25 MG tablet TAKE ONE TABLET BY MOUTH ONCE DAILY (Patient taking differently: Take 25 mg by mouth once daily) 30 tablet 0  . Cholecalciferol (VITAMIN D) 2000 UNITS CAPS Take 2,000 Units by mouth daily.     . diazepam (VALIUM) 5 MG tablet Take 5 mg by mouth every 6 (six) hours as needed for anxiety.    . DUEXIS 800-26.6 MG TABS Take 1 tablet by mouth 3 (three) times daily.  0  . enoxaparin (LOVENOX) 80 MG/0.8ML injection Inject 0.8 mLs (80  mg total) into the skin every 12 (twelve) hours. 14 Syringe 1  . gabapentin (NEURONTIN) 300 MG capsule Take 300 mg by mouth 2 (two) times daily.     Marland Kitchen HYDROcodone-acetaminophen (NORCO/VICODIN) 5-325 MG per tablet Take 1 tablet by mouth every 6 (six) hours as needed for moderate pain.     Marland Kitchen lisinopril (PRINIVIL,ZESTRIL) 20 MG tablet TAKE ONE TABLET BY MOUTH ONCE DAILY. NEEDS  APPOINTMENT  FOR  FURTHER  REFILLS. (Patient taking differently: Take 20mg  by mouth once daily) 7 tablet 0  . metFORMIN (GLUCOPHAGE) 500 MG tablet Take 500 mg by mouth 2 (two) times daily with a meal.     . rosuvastatin (CRESTOR) 10 MG tablet Take 10 mg by mouth daily.    Marland Kitchen warfarin (COUMADIN) 5 MG tablet Take 5 mg by mouth daily.     No current facility-administered medications for this visit.      For VQI Use Only  PRE-ADM LIVING: Home  AMB STATUS: Ambulatory   Physical Examination  There were no vitals filed for this visit. There is no weight on file to calculate BMI.  General: A&O x 3, WDWN  Pulmonary: Sym exp, good air movt, CTAB, no rales, rhonchi, & wheezing  Cardiac: RRR, Nl S1, S2, no Murmurs, rubs or gallops  Vascular: Vessel Right Left  Radial Palpable Palpable  Brachial Palpable Palpable  Carotid Palpable, without bruit Palpable, without bruit  Aorta Not palpable N/A  Femoral Palpable [place]  Popliteal Not palpable Not palpable  PT Faintly Palpable Faintly Palpable  DP  Faintly Palpable Faintly Palpable   Gastrointestinal: soft, NTND, -G/R, - HSM, - masses, - CVAT B  Musculoskeletal: M/S 5/5 throughout , Extremities without ischemic changes ,   Neurologic: Pain and light touch intact in extremities except decreased in R foot, Motor exam as listed above   Medical Decision Making  Austin Espinoza is a 60 y.o. (06-01-1956) male who presents s/p L EIA PTA+S, R fem EA, L iliofem EA w/ BPA, L to R fem-fem BPG .  Based on his angiographic findings, this patient  needs: q3 month surveillance with BLE ABI and aortoiliac duplex to evaluate the left iliac arterial system and fem-fem graft.  I discussed in depth with the patient the nature of atherosclerosis, and emphasized the importance of maximal medical management including strict control of blood pressure, blood glucose, and lipid levels, obtaining regular exercise, and cessation of smoking. The patient is aware that without maximal medical management the underlying atherosclerotic disease process will progress, limiting the benefit of any interventions.  The patient is currently on a statin: Crestor.  The patient is currently on an anti-platelet: ASA.  Pt is also on Warfarin.  Thank you for allowing [place] to participate in this patient's care.   Adele Barthel, MD, FACS [facility] [place] Office: [phone] Pager: [phone]  11/29/2015, 8:52 AM

## 2015-12-27 ENCOUNTER — Telehealth: Payer: Self-pay | Admitting: Vascular Surgery

## 2015-12-27 NOTE — Telephone Encounter (Signed)
Burley Saver is calling from Dr. Emi Holes office in [facility]. They are seeing if the patient can come off Blood Thinner. The patient informed them of the stent Dr. Bridgett Larsson placed in him in January 2017. They need to schedule the procedure today. Please call [phone]

## 2015-12-30 NOTE — Telephone Encounter (Signed)
Phone call ret'd to [facility] @ Dr. Silvano Bilis office.  She stated the pt. Has an elevated PSA, and will need to have a prostate bx., and will need to come off his Coumadin 1 week prior, and 5-7 days after the bx.  Is requesting approval from Dr. Bridgett Larsson.  Will send message to Dr. Bridgett Larsson, and return call with recommendation.  Agreed.

## 2015-12-30 NOTE — Telephone Encounter (Signed)
Austin Winder, MD  Denman George, RN           Can hold anticoag      1:55 PM;  Notified Daleen Snook @ Dr. Silvano Bilis office of approval per Dr. Bridgett Larsson to hold anticoagulation prior to prostate bx.  Verb. Understanding.  Will fax Triage note to reflect the order.

## 2016-02-13 DIAGNOSIS — R202 Paresthesia of skin: Secondary | ICD-10-CM | POA: Insufficient documentation

## 2016-02-13 DIAGNOSIS — M10072 Idiopathic gout, left ankle and foot: Secondary | ICD-10-CM | POA: Insufficient documentation

## 2016-02-13 HISTORY — DX: Paresthesia of skin: R20.2

## 2016-02-13 HISTORY — DX: Idiopathic gout, left ankle and foot: M10.072

## 2016-02-21 ENCOUNTER — Encounter: Payer: Self-pay | Admitting: Vascular Surgery

## 2016-02-27 NOTE — Progress Notes (Deleted)
Established Critical Limb Ischemia  History of Present Illness  Austin Espinoza is a 60 y.o. ([birth year]) male who presents for cc: routine follow up. This patient's prior procedures include:  Date: 07/18/15:  1. Angioplasty and stenting of left common iliac artery and external iliac artery (6 mm x 80 mm). This was done for diseased L EIA only 2-3 mm in diameter. This was stented with a 6 mm stent.   Pt also underwent on 01/29/15: 1. Right common femoral artery endarterectomy 2. Left-to-right femorofemoral bypass with Dacron 3. Open cannulation of left common femoral artery  4. Left iliac angiogram 5. Thrombectomy of femorofemoral graft 6. Left iliofemoral endarterectomy with bovine patch angioplasty 7. Redo Left-to-right femorofemoral bypass with Propaten  ***The patient notes continued resolution of lower extremity symptoms. The patient is able to complete their activities of daily living. The patient's current symptoms are: ***.  Past Medical History, Past Surgical History, Social History, Family History, Medications, Allergies, and Review of Systems are unchanged from previous evaluation on 11/29/15  On ROS: ***  Current Outpatient Prescriptions  Medication Sig Dispense Refill  . aspirin 81 MG tablet Take 1 tablet (81 mg total) by mouth daily. 30 tablet 11  . atenolol (TENORMIN) 25 MG tablet TAKE ONE TABLET BY MOUTH ONCE DAILY (Patient taking differently: Take 25 mg by mouth once daily) 30 tablet 0  . Cholecalciferol (VITAMIN D) 2000 UNITS CAPS Take 2,000 Units by mouth daily.     . diazepam (VALIUM) 5 MG tablet Take 5 mg by mouth every 6 (six) hours as needed for anxiety.    . DUEXIS 800-26.6 MG TABS Take 1 tablet by mouth 3 (three) times daily.  0  . enoxaparin (LOVENOX) 80 MG/0.8ML injection Inject 0.8 mLs (80 mg total) into the skin every 12 (twelve) hours. 14 Syringe 1  . gabapentin (NEURONTIN) 300 MG capsule Take 300 mg by mouth 2 (two) times daily.     Marland Kitchen  HYDROcodone-acetaminophen (NORCO/VICODIN) 5-325 MG per tablet Take 1 tablet by mouth every 6 (six) hours as needed for moderate pain.     Marland Kitchen lisinopril (PRINIVIL,ZESTRIL) 20 MG tablet TAKE ONE TABLET BY MOUTH ONCE DAILY. NEEDS  APPOINTMENT  FOR  FURTHER  REFILLS. (Patient taking differently: Take 20mg  by mouth once daily) 7 tablet 0  . metFORMIN (GLUCOPHAGE) 500 MG tablet Take 500 mg by mouth 2 (two) times daily with a meal.     . rosuvastatin (CRESTOR) 10 MG tablet Take 10 mg by mouth daily.    Marland Kitchen warfarin (COUMADIN) 5 MG tablet Take 5 mg by mouth daily.     No current facility-administered medications for this visit.   For VQI Use Only  PRE-ADM LIVING: Home  AMB STATUS: Ambulatory   Physical Examination  There were no vitals filed for this visit.   There is no weight on file to calculate BMI.  ***General: A&O x 3, WDWN  Pulmonary: Sym exp, good air movt, CTAB, no rales, rhonchi, & wheezing  Cardiac: RRR, Nl S1, S2, no Murmurs, rubs or gallops  Vascular: Vessel Right Left  Radial Palpable Palpable  Brachial Palpable Palpable  Carotid Palpable, without bruit Palpable, without bruit  Aorta Not palpable N/A  Femoral Palpable Palapble  Popliteal Not palpable Not palpable  PT Faintly Palpable Faintly Palpable  DP Faintly Palpable Faintly Palpable   Gastrointestinal: soft, NTND, -G/R, - HSM, - masses, - CVAT B  Musculoskeletal: M/S 5/5 throughout , Extremities without ischemic changes ,   Neurologic: Pain and  light touch intact in extremities except decreased in R foot, Motor exam as listed above   Medical Decision Making  Austin Espinoza is a 61 y.o. ([birth year]) male who presents s/p L EIA PTA+S, R fem EA, L iliofem EA w/ BPA, L to R fem-fem BPG .  Based on his angiographic findings, this patient needs: q3 month surveillance with BLE ABI and aortoiliac duplex to evaluate the left iliac arterial system and fem-fem  graft.  I discussed in depth with the patient the nature of atherosclerosis, and emphasized the importance of maximal medical management including strict control of blood pressure, blood glucose, and lipid levels, obtaining regular exercise, and cessation of smoking. The patient is aware that without maximal medical management the underlying atherosclerotic disease process will progress, limiting the benefit of any interventions.  The patient is currently on a statin: Crestor.  The patient is currently on an anti-platelet: ASA.  Pt is also on Warfarin.  Thank you for allowing [place] to participate in this patient's care.   Adele Barthel, MD, FACS [facility] Office: [phone] Pager: [phone]

## 2016-02-28 ENCOUNTER — Encounter: Payer: BLUE CROSS/BLUE SHIELD | Admitting: Vascular Surgery

## 2016-02-28 ENCOUNTER — Ambulatory Visit (HOSPITAL_COMMUNITY): Payer: BLUE CROSS/BLUE SHIELD

## 2016-02-28 ENCOUNTER — Encounter (HOSPITAL_COMMUNITY): Payer: BLUE CROSS/BLUE SHIELD

## 2016-02-28 NOTE — Progress Notes (Signed)
This encounter was created in error - please disregard.  This encounter was created in error - please disregard.

## 2016-02-29 IMAGING — NM NM MYOCAR MULTI W/ SPECT
3 series · 18 of 18 positions shown · non-contrast
Comparison: none

[Series 1: rest · 6.51mm/px · 6 of 64 frames shown]
[frame 6/64]
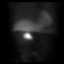
[frame 16/64]
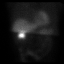
[frame 27/64]
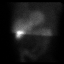
[frame 38/64]
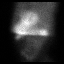
[frame 48/64]
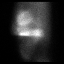
[frame 59/64]
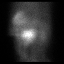

[Series 2: stress · 6.51mm/px · 6 of 512 frames shown (1 of 2)]
[frame 43/512]
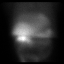
[frame 128/512]
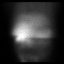
[frame 214/512]
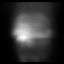
[frame 299/512]
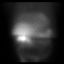
[frame 384/512]
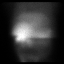
[frame 470/512]
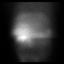

[Series 2: stress · 6.51mm/px · 6 of 64 frames shown (2 of 2)]
[frame 6/64]
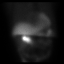
[frame 16/64]
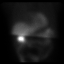
[frame 27/64]
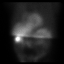
[frame 38/64]
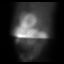
[frame 48/64]
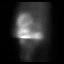
[frame 59/64]
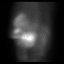

[18 of 18 positions shown; findings below may reference images not displayed]

Canned report from images found in remote index.

Refer to host system for actual result text.

## 2016-03-13 ENCOUNTER — Encounter: Payer: Self-pay | Admitting: Vascular Surgery

## 2016-03-16 NOTE — Progress Notes (Signed)
Established Critical Limb Ischemia  History of Present Illness  Austin Espinoza is a 60 y.o. (21-Nov-1955) male who presents for cc: routine follow up. This patient's prior procedures include:  Date: 07/18/15:  1. Angioplasty and stenting of left common iliac artery and external iliac artery (6 mm x 80 mm). This was done for diseased L EIA only 2-3 mm in diameter. This was stented with a 6 mm stent.  Pt also underwent on 01/29/15: 1. Right common femoral artery endarterectomy 2. Left-to-right femorofemoral bypass with Dacron 3. Open cannulation of left common femoral artery  4. Left iliac angiogram 5. Thrombectomy of femorofemoral graft 6. Left iliofemoral endarterectomy with bovine patch angioplasty 7. Redo Left-to-right femorofemoral bypass with Propaten  The patient's sx include: mild right foot numbness which he attributes to his back.  The patient is ble to complete their activities of daily living. Pt also has known DM controlled with metformin.  Past Medical History, Past Surgical History, Social History, Family History, Medications, Allergies, and Review of Systems are unchanged from previous evaluation on 11/29/15  On ROS: right foot numbness, no rest pain or intermittent claudication   Current Outpatient Prescriptions  Medication Sig Dispense Refill  . aspirin 81 MG tablet Take 1 tablet (81 mg total) by mouth daily. 30 tablet 11  . atenolol (TENORMIN) 25 MG tablet TAKE ONE TABLET BY MOUTH ONCE DAILY (Patient taking differently: Take 25 mg by mouth once daily) 30 tablet 0  . Cholecalciferol (VITAMIN D) 2000 UNITS CAPS Take 2,000 Units by mouth daily.     . diazepam (VALIUM) 5 MG tablet Take 5 mg by mouth every 6 (six) hours as needed for anxiety.    . DUEXIS 800-26.6 MG TABS Take 1 tablet by mouth 3 (three) times daily.  0  . enoxaparin (LOVENOX) 80 MG/0.8ML injection Inject 0.8 mLs (80 mg total) into the skin every 12 (twelve) hours. 14 Syringe 1  . gabapentin  (NEURONTIN) 300 MG capsule Take 300 mg by mouth 2 (two) times daily.     Marland Kitchen HYDROcodone-acetaminophen (NORCO/VICODIN) 5-325 MG per tablet Take 1 tablet by mouth every 6 (six) hours as needed for moderate pain.     Marland Kitchen lisinopril (PRINIVIL,ZESTRIL) 20 MG tablet TAKE ONE TABLET BY MOUTH ONCE DAILY. NEEDS  APPOINTMENT  FOR  FURTHER  REFILLS. (Patient taking differently: Take 20mg  by mouth once daily) 7 tablet 0  . metFORMIN (GLUCOPHAGE) 500 MG tablet Take 500 mg by mouth 2 (two) times daily with a meal.     . rosuvastatin (CRESTOR) 10 MG tablet Take 10 mg by mouth daily.    Marland Kitchen warfarin (COUMADIN) 5 MG tablet Take 5 mg by mouth daily.     No current facility-administered medications for this visit.    For VQI Use Only  PRE-ADM LIVING: Home  AMB STATUS: Ambulatory   Physical Examination  Filed Vitals:   03/20/16 0958 03/20/16 1000  BP: 159/76 158/80  Pulse: 57   Temp: 98 F (36.7 C)   TempSrc: Oral   Resp: 16   Height: 5\' 9"  (1.753 m)   Weight: 182 lb (82.555 kg)   SpO2: 100%     Body mass index is 26.86 kg/(m^2).   General: A&O x 3, WDWN  Pulmonary: Sym exp, good air movt, CTAB, no rales, rhonchi, & wheezing  Cardiac: RRR, Nl S1, S2, no Murmurs, rubs or gallops  Vascular: Vessel Right Left  Radial Palpable Palpable  Brachial Palpable Palpable  Carotid Palpable, without bruit Palpable, without  bruit  Aorta Not palpable N/A  Femoral Palpable Palapble  Popliteal Not palpable Not palpable  PT Palpable Palpable  DP Palpable Faintly Palpable   Gastrointestinal: soft, NTND, -G/R, - HSM, - masses, - CVAT B  Musculoskeletal: M/S 5/5 throughout , Extremities without ischemic changes ,   Neurologic: Pain and light touch intact in extremities except decreased in R foot, Motor exam as listed above  ABI (Date: 03/20/2016)  R:   ABI: 0.97 (0.77),   DP: tr,   PT: tri,   TBI: 0.89  L:   ABI: 0.99 (0.77),   DP: bi,    PT: tri,   TBI: 0.87   Aortoiliac duplex (03/20/2016)  Widely patent L EIA stent  L CIA stent not well visualized due to bowel gas  Widely patent fem-fem BPG   Medical Decision Making  Austin Espinoza is a 60 y.o. (1956/05/04) male who presents s/p L EIA PTA+S, R fem EA, L iliofem EA w/ BPA, L to R fem-fem BPG .  Based on his angiographic findings, this patient needs: q6 month surveillance with BLE ABI and aortoiliac duplex to evaluate the left iliac arterial system and fem-fem graft.  I discussed in depth with the patient the nature of atherosclerosis, and emphasized the importance of maximal medical management including strict control of blood pressure, blood glucose, and lipid levels, obtaining regular exercise, and cessation of smoking. The patient is aware that without maximal medical management the underlying atherosclerotic disease process will progress, limiting the benefit of any interventions.  The patient is currently on a statin: Crestor.  The patient is currently on an anti-platelet: ASA.  Pt is also on Warfarin.  Thank you for allowing [place] to participate in this patient's care.   Adele Barthel, MD, FACS [facility] [place] [facility] Office: [phone] Pager: [phone]

## 2016-03-20 ENCOUNTER — Ambulatory Visit (HOSPITAL_COMMUNITY)
Admission: RE | Admit: 2016-03-20 | Discharge: 2016-03-20 | Disposition: A | Discharge status: Home / Self Care | Payer: BLUE CROSS/BLUE SHIELD | Source: Ambulatory Visit | Attending: Vascular Surgery | Admitting: Vascular Surgery

## 2016-03-20 ENCOUNTER — Ambulatory Visit (INDEPENDENT_AMBULATORY_CARE_PROVIDER_SITE_OTHER)
Admission: RE | Admit: 2016-03-20 | Discharge: 2016-03-20 | Disposition: A | Discharge status: Home / Self Care | Payer: BLUE CROSS/BLUE SHIELD | Source: Ambulatory Visit | Attending: Vascular Surgery | Admitting: Vascular Surgery

## 2016-03-20 ENCOUNTER — Encounter: Payer: Self-pay | Admitting: Vascular Surgery

## 2016-03-20 ENCOUNTER — Ambulatory Visit (INDEPENDENT_AMBULATORY_CARE_PROVIDER_SITE_OTHER): Payer: BLUE CROSS/BLUE SHIELD | Admitting: Vascular Surgery

## 2016-03-20 VITALS — BP 158/80 | HR 57 | Temp 98.0°F | Resp 16 | Ht 69.0 in | Wt 182.0 lb

## 2016-03-20 DIAGNOSIS — I998 Other disorder of circulatory system: Secondary | ICD-10-CM

## 2016-03-20 DIAGNOSIS — Z720 Tobacco use: Secondary | ICD-10-CM | POA: Insufficient documentation

## 2016-03-20 DIAGNOSIS — I10 Essential (primary) hypertension: Secondary | ICD-10-CM | POA: Insufficient documentation

## 2016-03-20 DIAGNOSIS — I739 Peripheral vascular disease, unspecified: Secondary | ICD-10-CM

## 2016-03-20 DIAGNOSIS — E119 Type 2 diabetes mellitus without complications: Secondary | ICD-10-CM | POA: Diagnosis not present

## 2016-03-20 DIAGNOSIS — R0989 Other specified symptoms and signs involving the circulatory and respiratory systems: Secondary | ICD-10-CM | POA: Diagnosis present

## 2016-03-20 DIAGNOSIS — I70229 Atherosclerosis of native arteries of extremities with rest pain, unspecified extremity: Secondary | ICD-10-CM

## 2016-03-20 DIAGNOSIS — I2510 Atherosclerotic heart disease of native coronary artery without angina pectoris: Secondary | ICD-10-CM | POA: Insufficient documentation

## 2016-03-20 DIAGNOSIS — E785 Hyperlipidemia, unspecified: Secondary | ICD-10-CM | POA: Diagnosis not present

## 2016-05-06 NOTE — Addendum Note (Signed)
Addended by: Kaleen Mask on: 05/06/2016 02:08 PM   Modules accepted: Orders

## 2016-10-06 ENCOUNTER — Encounter: Payer: Self-pay | Admitting: Cardiology

## 2016-10-20 ENCOUNTER — Encounter: Payer: Self-pay | Admitting: Vascular Surgery

## 2016-10-20 NOTE — Progress Notes (Deleted)
Established Critical Limb Ischemia  History of Present Illness  Austin Espinoza is a 61 y.o. ([birth year]) male who presents for cc: routine follow up. This patient's prior procedures include:  Date: 07/18/15:  1. Angioplasty and stenting of left common iliac artery and external iliac artery (6 mm x 80 mm). This was done for diseased L EIA only 2-3 mm in diameter. This was stented with a 6 mm stent.  Pt also underwent on 01/29/15: 1. Right common femoral artery endarterectomy 2. Left-to-right femorofemoral bypass with Dacron 3. Open cannulation of left common femoral artery  4. Left iliac angiogram 5. Thrombectomy of femorofemoral graft 6. Left iliofemoral endarterectomy with bovine patch angioplasty 7. Redo Left-to-right femorofemoral bypass with Propaten  The patient's sx include: mild right foot numbness which he attributes to his back.  The patient is ble to complete their activities of daily living. Pt also has known DM controlled with metformin.  Past Medical History, Past Surgical History, Social History, Family History, Medications, Allergies, and Review of Systems are unchanged from previous evaluation on 11/29/15  On ROS: ***, ***  Current Outpatient Prescriptions  Medication Sig Dispense Refill  . aspirin 81 MG tablet Take 1 tablet (81 mg total) by mouth daily. 30 tablet 11  . atenolol (TENORMIN) 25 MG tablet TAKE ONE TABLET BY MOUTH ONCE DAILY (Patient not taking: Reported on 03/20/2016) 30 tablet 0  . Cholecalciferol (VITAMIN D) 2000 UNITS CAPS Take 2,000 Units by mouth daily.     . diazepam (VALIUM) 5 MG tablet Take 5 mg by mouth every 6 (six) hours as needed for anxiety.    . DUEXIS 800-26.6 MG TABS Take 1 tablet by mouth 3 (three) times daily.  0  . enoxaparin (LOVENOX) 80 MG/0.8ML injection Inject 0.8 mLs (80 mg total) into the skin every 12 (twelve) hours. 14 Syringe 1  . gabapentin (NEURONTIN) 300 MG capsule Take 300 mg by mouth 2 (two) times daily.      Marland Kitchen HYDROcodone-acetaminophen (NORCO/VICODIN) 5-325 MG per tablet Take 1 tablet by mouth every 6 (six) hours as needed for moderate pain.     Marland Kitchen lisinopril (PRINIVIL,ZESTRIL) 20 MG tablet TAKE ONE TABLET BY MOUTH ONCE DAILY. NEEDS  APPOINTMENT  FOR  FURTHER  REFILLS. (Patient taking differently: Take 20mg  by mouth once daily) 7 tablet 0  . metFORMIN (GLUCOPHAGE) 500 MG tablet Take 500 mg by mouth 2 (two) times daily with a meal.     . rosuvastatin (CRESTOR) 10 MG tablet Take 10 mg by mouth daily.    Marland Kitchen warfarin (COUMADIN) 5 MG tablet Take 5 mg by mouth daily.     No current facility-administered medications for this visit.     For VQI Use Only  PRE-ADM LIVING: Home  AMB STATUS: Ambulatory   Physical Examination  There were no vitals filed for this visit.  There is no height or weight on file to calculate BMI.  General: A&O x 3, WDWN  Pulmonary: Sym exp, good air movt, CTAB, no rales, rhonchi, & wheezing  Cardiac: RRR, Nl S1, S2, no Murmurs, rubs or gallops  Vascular: Vessel Right Left  Radial Palpable Palpable  Brachial Palpable Palpable  Carotid Palpable, without bruit Palpable, without bruit  Aorta Not palpable N/A  Femoral Palpable Palapble  Popliteal Not palpable Not palpable  PT Palpable Palpable  DP Palpable Faintly Palpable   Gastrointestinal: soft, NTND, -G/R, - HSM, - masses, - CVAT B  Musculoskeletal: M/S 5/5 throughout , Extremities without ischemic changes ,  Neurologic: Pain and light touch intact in extremities except decreased in R foot, Motor exam as listed above  ABI (Date: ***)  R:   ABI: *** (***),   DP: {Signals:19197::"none","mono","bi","tri"}  PT: {Signals:19197::"none","mono","bi","tri"}  TBI:  ***  L:   ABI: *** (***),   DP: {Signals:19197::"none","mono","bi","tri"}  PT: {Signals:19197::"none","mono","bi","tri"}  TBI: ***  Aortoiliac duplex  (***)  ***   Medical Decision Making  Taimur Feicht is a 61 y.o. (12-04-1955) male who presents s/p L EIA PTA+S, R fem EA, L iliofem EA w/ BPA, L to R fem-fem BPG   Based on his angiographic findings, this patient needs: q6 month surveillance with BLE ABI and aortoiliac duplex to evaluate the left iliac arterial system and fem-fem graft.  I discussed in depth with the patient the nature of atherosclerosis, and emphasized the importance of maximal medical management including strict control of blood pressure, blood glucose, and lipid levels, obtaining regular exercise, and cessation of smoking. The patient is aware that without maximal medical management the underlying atherosclerotic disease process will progress, limiting the benefit of any interventions.  The patient is currently on a statin: Crestor.  The patient is currently on an anti-platelet: ASA.  Pt is also on Warfarin.  Thank you for allowing [place] to participate in this patient's care.   Adele Barthel, MD, FACS [facility] Office: [phone] Pager: [phone]

## 2016-10-23 ENCOUNTER — Encounter (HOSPITAL_COMMUNITY): Payer: BLUE CROSS/BLUE SHIELD

## 2016-10-23 ENCOUNTER — Ambulatory Visit: Payer: BLUE CROSS/BLUE SHIELD | Admitting: Vascular Surgery

## 2016-12-18 ENCOUNTER — Encounter: Payer: Self-pay | Admitting: Vascular Surgery

## 2016-12-21 NOTE — Progress Notes (Signed)
Established Critical Limb Ischemia   History of Present Illness  Austin Espinoza is a 61 y.o. (July 16, 1956) male who presents for cc: mild amount of cramping in R leg. This patient's prior procedures include:  Date: 07/18/15:  1. Angioplasty and stenting of left common iliac artery and external iliac artery (6 mm x 80 mm). This was done for diseased L EIA only 2-3 mm in diameter. This was stented with a 6 mm stent.  Pt also underwent on 01/29/15: 1. Right common femoral artery endarterectomy 2. Left-to-right femorofemoral bypass with Dacron 3. Open cannulation of left common femoral artery  4. Left iliac angiogram 5. Thrombectomy of femorofemoral graft 6. Left iliofemoral endarterectomy with bovine patch angioplasty 7. 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  The patient is able to complete their activities of daily living. Pt also has known DM controlled with metformin.  Past Medical History, Past Surgical History, Social History, Family History, Medications, Allergies, and Review of Systems are unchanged from previous evaluation on 03/16/16  On ROS: no rest pain, mild intermittent claudication   Current Outpatient Prescriptions  Medication Sig Dispense Refill  . aspirin 81 MG tablet Take 1 tablet (81 mg total) by mouth daily. 30 tablet 11  . atenolol (TENORMIN) 25 MG tablet TAKE ONE TABLET BY MOUTH ONCE DAILY (Patient not taking: Reported on 03/20/2016) 30 tablet 0  . Cholecalciferol (VITAMIN D) 2000 UNITS CAPS Take 2,000 Units by mouth daily.     . diazepam (VALIUM) 5 MG tablet Take 5 mg by mouth every 6 (six) hours as needed for anxiety.    . DUEXIS 800-26.6 MG TABS Take 1 tablet by mouth 3 (three) times daily.  0  . enoxaparin (LOVENOX) 80 MG/0.8ML injection Inject 0.8 mLs (80 mg total) into the skin every 12 (twelve) hours. 14 Syringe 1  . gabapentin  (NEURONTIN) 300 MG capsule Take 300 mg by mouth 2 (two) times daily.     Marland Kitchen HYDROcodone-acetaminophen (NORCO/VICODIN) 5-325 MG per tablet Take 1 tablet by mouth every 6 (six) hours as needed for moderate pain.     Marland Kitchen lisinopril (PRINIVIL,ZESTRIL) 20 MG tablet TAKE ONE TABLET BY MOUTH ONCE DAILY. NEEDS  APPOINTMENT  FOR  FURTHER  REFILLS. (Patient taking differently: Take 20mg  by mouth once daily) 7 tablet 0  . metFORMIN (GLUCOPHAGE) 500 MG tablet Take 500 mg by mouth 2 (two) times daily with a meal.     . rosuvastatin (CRESTOR) 10 MG tablet Take 10 mg by mouth daily.    Marland Kitchen warfarin (COUMADIN) 5 MG tablet Take 5 mg by mouth daily.     No current facility-administered medications for this visit.      Physical Examination   Vitals:   12/25/16 1040  BP: 134/78  Pulse: 62  Resp: 20  Temp: 97 F (36.1 C)  TempSrc: Oral  SpO2: 98%  Weight: 174 lb 4.8 oz (79.1 kg)  Height: 5\' 9"  (1.753 m)    Body mass index is 25.74 kg/m.  General Alert, O x 3, WD, NAD  Pulmonary Sym exp, good B air movt, CTA B  Cardiac RRR, Nl S1, S2, no Murmurs, No rubs, No S3,S4  Vascular Vessel Right Left  Radial Palpable Palpable  Brachial Palpable Palpable  Carotid Palpable, No Bruit Palpable, No Bruit  Aorta Not palpable N/A  Femoral Palpable Palpable  Popliteal Not palpable Not palpable  PT Palpable Palpable  DP Palpable Palpable    Gastrointestinal soft, non-distended, non-tender to palpation, No guarding or rebound, no HSM, no masses, no CVAT B, No palpable prominent aortic pulse,    Musculoskeletal M/S 5/5 throughout  , Extremities without ischemic changes  , No edema present, No obvious varicosities , No Lipodermatosclerosis present  Neurologic Pain and light touch intact in extremities , Motor exam as listed above    ABI (12/25/2016)  R:   ABI: 0.90 (0.97),   PT: bi  DP: bi  TBI:  0.89  L:   ABI: 1.0 (0.99),   PT: bi  DP: bi  TBI: 1.00  Aortoiliac duplex (12/25/2016)  L: EIA  301-312 c/s, inflow 289 c/s  Widely patent stent  Widely patent fem-fem BPG   Medical Decision Making  Austin Espinoza is a 61 y.o. (1956-01-01) male who presents s/p L EIA PTA+S, R fem EA, L iliofem EA w/ BPA, L to R fem-fem BPG   Pt having some mild intermittent claudication where previously none and drop off in ABI on R along with elevated velocity proximal to L EIA stent ? will need radiographic evaluation as this is the inflow to both legs  Based on his angiographic findings, this patient needs: aortogram, L pelvic angiogram, possible intervention.  Pt is scheduled on 3 MAY 18  I discussed in depth with the patient the nature of atherosclerosis, and emphasized the importance of maximal medical management including strict control of blood pressure, blood glucose, and lipid levels, obtaining regular exercise, and cessation of smoking. The patient is aware that without maximal medical management the underlying atherosclerotic disease process will progress, limiting the benefit of any interventions.  The patient is currently on a statin: Crestor.  The patient is currently on an anti-platelet: ASA.  Pt is also on Eliquis currently as an adjunct.  Thank you for allowing [place] to participate in this patient's care.   Adele Barthel, MD, FACS [facility] Office: [phone] Pager: [phone]

## 2016-12-25 ENCOUNTER — Other Ambulatory Visit: Payer: Self-pay

## 2016-12-25 ENCOUNTER — Ambulatory Visit (HOSPITAL_COMMUNITY)
Admission: RE | Admit: 2016-12-25 | Discharge: 2016-12-25 | Disposition: A | Discharge status: Home / Self Care | Payer: BLUE CROSS/BLUE SHIELD | Source: Ambulatory Visit | Attending: Vascular Surgery | Admitting: Vascular Surgery

## 2016-12-25 ENCOUNTER — Ambulatory Visit (INDEPENDENT_AMBULATORY_CARE_PROVIDER_SITE_OTHER)
Admission: RE | Admit: 2016-12-25 | Discharge: 2016-12-25 | Disposition: A | Discharge status: Home / Self Care | Payer: BLUE CROSS/BLUE SHIELD | Source: Ambulatory Visit | Attending: Vascular Surgery | Admitting: Vascular Surgery

## 2016-12-25 ENCOUNTER — Ambulatory Visit (INDEPENDENT_AMBULATORY_CARE_PROVIDER_SITE_OTHER): Payer: BLUE CROSS/BLUE SHIELD | Admitting: Vascular Surgery

## 2016-12-25 ENCOUNTER — Encounter: Payer: Self-pay | Admitting: Vascular Surgery

## 2016-12-25 VITALS — BP 134/78 | HR 62 | Temp 97.0°F | Resp 20 | Ht 69.0 in | Wt 174.3 lb

## 2016-12-25 DIAGNOSIS — I998 Other disorder of circulatory system: Secondary | ICD-10-CM

## 2016-12-25 DIAGNOSIS — I739 Peripheral vascular disease, unspecified: Secondary | ICD-10-CM

## 2016-12-25 DIAGNOSIS — I7789 Other specified disorders of arteries and arterioles: Secondary | ICD-10-CM | POA: Insufficient documentation

## 2016-12-25 DIAGNOSIS — I70229 Atherosclerosis of native arteries of extremities with rest pain, unspecified extremity: Secondary | ICD-10-CM

## 2016-12-31 ENCOUNTER — Encounter (HOSPITAL_COMMUNITY): Payer: Self-pay | Admitting: Vascular Surgery

## 2016-12-31 ENCOUNTER — Ambulatory Visit (HOSPITAL_COMMUNITY)
Admission: RE | Admit: 2016-12-31 | Discharge: 2016-12-31 | Disposition: A | Discharge status: Home / Self Care | Payer: BLUE CROSS/BLUE SHIELD | Source: Ambulatory Visit | Attending: Vascular Surgery | Admitting: Vascular Surgery

## 2016-12-31 ENCOUNTER — Encounter (HOSPITAL_COMMUNITY)
Admission: RE | Disposition: A | Discharge status: Home / Self Care | Payer: Self-pay | Source: Ambulatory Visit | Attending: Vascular Surgery

## 2016-12-31 DIAGNOSIS — Z7982 Long term (current) use of aspirin: Secondary | ICD-10-CM | POA: Diagnosis not present

## 2016-12-31 DIAGNOSIS — I70212 Atherosclerosis of native arteries of extremities with intermittent claudication, left leg: Secondary | ICD-10-CM | POA: Diagnosis not present

## 2016-12-31 DIAGNOSIS — I70213 Atherosclerosis of native arteries of extremities with intermittent claudication, bilateral legs: Secondary | ICD-10-CM | POA: Diagnosis not present

## 2016-12-31 DIAGNOSIS — Z7984 Long term (current) use of oral hypoglycemic drugs: Secondary | ICD-10-CM | POA: Diagnosis not present

## 2016-12-31 DIAGNOSIS — Y831 Surgical operation with implant of artificial internal device as the cause of abnormal reaction of the patient, or of later complication, without mention of misadventure at the time of the procedure: Secondary | ICD-10-CM | POA: Insufficient documentation

## 2016-12-31 DIAGNOSIS — Z7901 Long term (current) use of anticoagulants: Secondary | ICD-10-CM | POA: Insufficient documentation

## 2016-12-31 DIAGNOSIS — I70229 Atherosclerosis of native arteries of extremities with rest pain, unspecified extremity: Secondary | ICD-10-CM | POA: Diagnosis present

## 2016-12-31 DIAGNOSIS — E1151 Type 2 diabetes mellitus with diabetic peripheral angiopathy without gangrene: Secondary | ICD-10-CM | POA: Insufficient documentation

## 2016-12-31 DIAGNOSIS — T82856A Stenosis of peripheral vascular stent, initial encounter: Secondary | ICD-10-CM | POA: Diagnosis not present

## 2016-12-31 DIAGNOSIS — I998 Other disorder of circulatory system: Secondary | ICD-10-CM | POA: Diagnosis present

## 2016-12-31 HISTORY — PX: PERIPHERAL VASCULAR BALLOON ANGIOPLASTY: CATH118281

## 2016-12-31 HISTORY — PX: ABDOMINAL AORTOGRAM W/LOWER EXTREMITY: CATH118223

## 2016-12-31 LAB — POCT I-STAT, CHEM 8
BUN: 13 mg/dL (ref 6–20)
Calcium, Ion: 1.3 mmol/L (ref 1.15–1.40)
Chloride: 107 mmol/L (ref 101–111)
Creatinine, Ser: 1 mg/dL (ref 0.61–1.24)
Glucose, Bld: 98 mg/dL (ref 65–99)
HEMATOCRIT: 34 % — AB (ref 39.0–52.0)
HEMOGLOBIN: 11.6 g/dL — AB (ref 13.0–17.0)
Potassium: 4.4 mmol/L (ref 3.5–5.1)
SODIUM: 142 mmol/L (ref 135–145)
TCO2: 27 mmol/L (ref 0–100)

## 2016-12-31 LAB — GLUCOSE, CAPILLARY: Glucose-Capillary: 92 mg/dL (ref 65–99)

## 2016-12-31 LAB — POCT ACTIVATED CLOTTING TIME
ACTIVATED CLOTTING TIME: 180 s
Activated Clotting Time: 235 seconds

## 2016-12-31 SURGERY — ABDOMINAL AORTOGRAM W/LOWER EXTREMITY
Anesthesia: LOCAL

## 2016-12-31 MED ORDER — SODIUM CHLORIDE 0.9 % IV SOLN: 1.0000 mL/kg/h | INTRAVENOUS | Status: DC

## 2016-12-31 MED ORDER — HEPARIN (PORCINE) IN NACL 2-0.9 UNIT/ML-% IJ SOLN
INTRAMUSCULAR | Status: AC
  Filled 2016-12-31: qty 1000

## 2016-12-31 MED ORDER — HEPARIN (PORCINE) IN NACL 2-0.9 UNIT/ML-% IJ SOLN
INTRAMUSCULAR | Status: DC | PRN
  Administered 2016-12-31: 1000 mL

## 2016-12-31 MED ORDER — LIDOCAINE HCL 1 % IJ SOLN
INTRAMUSCULAR | Status: AC
  Filled 2016-12-31: qty 20

## 2016-12-31 MED ORDER — HEPARIN SODIUM (PORCINE) 1000 UNIT/ML IJ SOLN
INTRAMUSCULAR | Status: DC | PRN
  Administered 2016-12-31: 8500 [IU] via INTRAVENOUS

## 2016-12-31 MED ORDER — SODIUM CHLORIDE 0.9 % IV SOLN
INTRAVENOUS | Status: DC
  Administered 2016-12-31: 06:00:00 via INTRAVENOUS

## 2016-12-31 MED ORDER — ACETAMINOPHEN 325 MG PO TABS: 650.0000 mg | ORAL_TABLET | ORAL | Status: DC | PRN

## 2016-12-31 MED ORDER — IODIXANOL 320 MG/ML IV SOLN
INTRAVENOUS | Status: DC | PRN
  Administered 2016-12-31: 110 mL via INTRA_ARTERIAL

## 2016-12-31 MED ORDER — FENTANYL CITRATE (PF) 100 MCG/2ML IJ SOLN
INTRAMUSCULAR | Status: DC | PRN
  Administered 2016-12-31 (×2): 50 ug via INTRAVENOUS

## 2016-12-31 MED ORDER — FENTANYL CITRATE (PF) 100 MCG/2ML IJ SOLN
INTRAMUSCULAR | Status: AC
  Filled 2016-12-31: qty 2

## 2016-12-31 MED ORDER — MIDAZOLAM HCL 2 MG/2ML IJ SOLN
INTRAMUSCULAR | Status: DC | PRN
  Administered 2016-12-31: 1 mg via INTRAVENOUS

## 2016-12-31 MED ORDER — LIDOCAINE HCL (PF) 1 % IJ SOLN
INTRAMUSCULAR | Status: DC | PRN
Start: 2016-12-31 — End: 2016-12-31
  Administered 2016-12-31: 20 mL

## 2016-12-31 MED ORDER — HEPARIN SODIUM (PORCINE) 1000 UNIT/ML IJ SOLN
INTRAMUSCULAR | Status: AC
  Filled 2016-12-31: qty 1

## 2016-12-31 MED ORDER — HYDRALAZINE HCL 20 MG/ML IJ SOLN
10.0000 mg | INTRAMUSCULAR | Status: DC | PRN
Start: 2016-12-31 — End: 2016-12-31

## 2016-12-31 MED ORDER — MIDAZOLAM HCL 2 MG/2ML IJ SOLN
INTRAMUSCULAR | Status: AC
  Filled 2016-12-31: qty 2

## 2016-12-31 SURGICAL SUPPLY — 19 items
BALLN ARMADA 6X80X80 (BALLOONS) ×3
BALLN IN.PACT DCB 6X80 (BALLOONS) ×3
BALLOON ARMADA 6X80X80 (BALLOONS) IMPLANT
CATH OMNI FLUSH 5F 65CM (CATHETERS) ×1 IMPLANT
CATH SOFT-VU 4F 65 STRAIGHT (CATHETERS) IMPLANT
CATH SOFT-VU STRAIGHT 4F 65CM (CATHETERS) ×3
DCB IN.PACT 6X80 (BALLOONS) IMPLANT
DEVICE CONTINUOUS FLUSH (MISCELLANEOUS) ×1 IMPLANT
KIT ENCORE 26 ADVANTAGE (KITS) ×1 IMPLANT
KIT MICROINTRODUCER STIFF 5F (SHEATH) ×1 IMPLANT
KIT PV (KITS) ×3 IMPLANT
SHEATH BRITE TIP 6FR 35CM (SHEATH) ×1 IMPLANT
SHEATH PINNACLE 5F 10CM (SHEATH) ×1 IMPLANT
SHEATH PINNACLE 6F 10CM (SHEATH) ×1 IMPLANT
SYR MEDRAD MARK V 150ML (SYRINGE) ×3 IMPLANT
TRANSDUCER W/STOPCOCK (MISCELLANEOUS) ×3 IMPLANT
TRAY PV CATH (CUSTOM PROCEDURE TRAY) ×3 IMPLANT
WIRE AMPLATZ SSTIFF .035X260CM (WIRE) ×1 IMPLANT
WIRE BENTSON .035X145CM (WIRE) ×1 IMPLANT

## 2016-12-31 NOTE — H&P (View-Only) (Signed)
Established Critical Limb Ischemia   History of Present Illness  Austin Espinoza is a 61 y.o. (1956-05-23) male who presents for cc: mild amount of cramping in R leg. This patient's prior procedures include:  Date: 07/18/15:  1. Angioplasty and stenting of left common iliac artery and external iliac artery (6 mm x 80 mm). This was done for diseased L EIA only 2-3 mm in diameter. This was stented with a 6 mm stent.  Pt also underwent on 01/29/15: 1. Right common femoral artery endarterectomy 2. Left-to-right femorofemoral bypass with Dacron 3. Open cannulation of left common femoral artery  4. Left iliac angiogram 5. Thrombectomy of femorofemoral graft 6. Left iliofemoral endarterectomy with bovine patch angioplasty 7. 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  The patient is able to complete their activities of daily living. Pt also has known DM controlled with metformin.  Past Medical History, Past Surgical History, Social History, Family History, Medications, Allergies, and Review of Systems are unchanged from previous evaluation on 03/16/16  On ROS: no rest pain, mild intermittent claudication   Current Outpatient Prescriptions  Medication Sig Dispense Refill  . aspirin 81 MG tablet Take 1 tablet (81 mg total) by mouth daily. 30 tablet 11  . atenolol (TENORMIN) 25 MG tablet TAKE ONE TABLET BY MOUTH ONCE DAILY (Patient not taking: Reported on 03/20/2016) 30 tablet 0  . Cholecalciferol (VITAMIN D) 2000 UNITS CAPS Take 2,000 Units by mouth daily.     . diazepam (VALIUM) 5 MG tablet Take 5 mg by mouth every 6 (six) hours as needed for anxiety.    . DUEXIS 800-26.6 MG TABS Take 1 tablet by mouth 3 (three) times daily.  0  . enoxaparin (LOVENOX) 80 MG/0.8ML injection Inject 0.8 mLs (80 mg total) into the skin every 12 (twelve) hours. 14 Syringe 1  . gabapentin  (NEURONTIN) 300 MG capsule Take 300 mg by mouth 2 (two) times daily.     Marland Kitchen HYDROcodone-acetaminophen (NORCO/VICODIN) 5-325 MG per tablet Take 1 tablet by mouth every 6 (six) hours as needed for moderate pain.     Marland Kitchen lisinopril (PRINIVIL,ZESTRIL) 20 MG tablet TAKE ONE TABLET BY MOUTH ONCE DAILY. NEEDS  APPOINTMENT  FOR  FURTHER  REFILLS. (Patient taking differently: Take 20mg  by mouth once daily) 7 tablet 0  . metFORMIN (GLUCOPHAGE) 500 MG tablet Take 500 mg by mouth 2 (two) times daily with a meal.     . rosuvastatin (CRESTOR) 10 MG tablet Take 10 mg by mouth daily.    Marland Kitchen warfarin (COUMADIN) 5 MG tablet Take 5 mg by mouth daily.     No current facility-administered medications for this visit.      Physical Examination   Vitals:   12/25/16 1040  BP: 134/78  Pulse: 62  Resp: 20  Temp: 97 F (36.1 C)  TempSrc: Oral  SpO2: 98%  Weight: 174 lb 4.8 oz (79.1 kg)  Height: 5\' 9"  (1.753 m)    Body mass index is 25.74 kg/m.  General Alert, O x 3, WD, NAD  Pulmonary Sym exp, good B air movt, CTA B  Cardiac RRR, Nl S1, S2, no Murmurs, No rubs, No S3,S4  Vascular Vessel Right Left  Radial Palpable Palpable  Brachial Palpable Palpable  Carotid Palpable, No Bruit Palpable, No Bruit  Aorta Not palpable N/A  Femoral Palpable Palpable  Popliteal Not palpable Not palpable  PT Palpable Palpable  DP Palpable Palpable    Gastrointestinal soft, non-distended, non-tender to palpation, No guarding or rebound, no HSM, no masses, no CVAT B, No palpable prominent aortic pulse,    Musculoskeletal M/S 5/5 throughout  , Extremities without ischemic changes  , No edema present, No obvious varicosities , No Lipodermatosclerosis present  Neurologic Pain and light touch intact in extremities , Motor exam as listed above    ABI (12/25/2016)  R:   ABI: 0.90 (0.97),   PT: bi  DP: bi  TBI:  0.89  L:   ABI: 1.0 (0.99),   PT: bi  DP: bi  TBI: 1.00  Aortoiliac duplex (12/25/2016)  L: EIA  301-312 c/s, inflow 289 c/s  Widely patent stent  Widely patent fem-fem BPG   Medical Decision Making  Austin Espinoza is a 61 y.o. (May 03, 1956) male who presents s/p L EIA PTA+S, R fem EA, L iliofem EA w/ BPA, L to R fem-fem BPG   Pt having some mild intermittent claudication where previously none and drop off in ABI on R along with elevated velocity proximal to L EIA stent ? will need radiographic evaluation as this is the inflow to both legs  Based on his angiographic findings, this patient needs: aortogram, L pelvic angiogram, possible intervention.  Pt is scheduled on 3 MAY 18  I discussed in depth with the patient the nature of atherosclerosis, and emphasized the importance of maximal medical management including strict control of blood pressure, blood glucose, and lipid levels, obtaining regular exercise, and cessation of smoking. The patient is aware that without maximal medical management the underlying atherosclerotic disease process will progress, limiting the benefit of any interventions.  The patient is currently on a statin: Crestor.  The patient is currently on an anti-platelet: ASA.  Pt is also on Eliquis currently as an adjunct.  Thank you for allowing [place] to participate in this patient's care.   Adele Barthel, MD, FACS [facility] Office: [phone] Pager: [phone]

## 2016-12-31 NOTE — Interval H&P Note (Signed)
History and Physical Interval Note:  12/31/2016 6:48 AM  Austin Espinoza  has presented today for surgery, with the diagnosis of pvd with bilateral lower extremitty claudication  The various methods of treatment have been discussed with the patient and family. After consideration of risks, benefits and other options for treatment, the patient has consented to  Procedure(s): Abdominal Aortogram w/Lower Extremity (N/A) as a surgical intervention .  The patient's history has been reviewed, patient examined, no change in status, stable for surgery.  I have reviewed the patient's chart and labs.  Questions were answered to the patient's satisfaction.     Adele Barthel

## 2016-12-31 NOTE — Op Note (Signed)
OPERATIVE NOTE   PROCEDURE: 1.  Left common femoral artery cannulation under ultrasound guidance 2.  Placement of catheter in aorta 3.  Aortogram 4.  Conscious sedation for 43 minutes 5.  Limited bilateral leg runoff via catheter  6.  Drug coat angioplasty of left common iliac artery and external iliac artery   PRE-OPERATIVE DIAGNOSIS: mild intermittent claudication, high velocities in left iliofemoral system  POST-OPERATIVE DIAGNOSIS: same as above   SURGEON: Adele Barthel, MD  ANESTHESIA: conscious sedation  ESTIMATED BLOOD LOSS: 50 cc  CONTRAST: 110 cc  FINDING(S):  Aorta: patent  Inferior mesenteric artery: patent   Right Left  RA patent Patent, accessory renal supplies lower pole of kidney  CIA patent Patent, atherosclerotic changes in distal artery  EIA occluded Patent, CIA/EIA stent patent: 10 mm Hg, in-stent restenosis resolved after intervention  IIA patent patent  CFA patent Patent, widely patent femorofemoral graft   SPECIMEN(S):  none  INDICATIONS:   Austin Espinoza is a 60 y.o. male who presents with mild intermittent claudication and elevated velocities in left iliofemoral tract.  The patient presents for: aortogram, pelvic angiogram, and possible left iliac artery intervention.  I discussed with the patient the nature of angiographic procedures, especially the limited patencies of any endovascular intervention.  The patient is aware of that the risks of an angiographic procedure include but are not limited to: bleeding, infection, access site complications, renal failure, embolization, rupture of vessel, dissection, possible need for emergent surgical intervention, possible need for surgical procedures to treat the patient's pathology, and stroke and death.  The patient is aware of the risks and agrees to proceed.  DESCRIPTION: After full informed consent was obtained from the patient, the patient was brought back to the angiography suite.  The patient was  placed supine upon the angiography table and connected to cardiopulmonary monitoring equipment.  The patient was then given conscious sedation, the amounts of which are documented in the patient's chart.  A circulating radiologic technician maintained continuous monitoring of the patient's cardiopulmonary status.  Additionally, the control room radiologic technician provided backup monitoring throughout the procedure.  The patient was prepped and drape in the standard fashion for an angiographic procedure.  At this point, attention was turned to the left groin.  Under Sonosite, I tried to cannulate the common femoral artery proximal to the femorofemoral graft, but the microneedle would not puncture the scar tissue.  I switched to a 18 gauge needle and was able to cannulate the left common femoral artery under Sonosite guidance.  I placed a Bentson wire up into the aorta.  I exchanged the needle for a 4-Fr straight catheter.  I then exchanged the wire for an Amplatz wire.  I exchanged the catheter for a 6-Fr dilator.  I used this to dilate the scarred subcutaneous tract.  I placed a 5-Fr sheath over the Amplatz wire.    The Omniflush catheter was then loaded over the wire up to the level of L1.  The catheter was connected to the power injector circuit.  After de-airring and de-clotting the circuit, a power injector aortogram was completed.  I pulled the catheter down to the distal aorta and a magnified bilateral pelvic injection was completed via the catheter.  The images demonstrated patency of the left common iliac artery/external iliac artery stent but there was an irregularity to the lumen.  I replaced the straight catheter and did a pull back gradient.  Based on this there was 5-10 mm Hg drop off  going into the mid-stent.  I feel treating the in-stent restenosis was necessary to help patency in this patient given this is the inflow to the femorofemoral graft.  I replaced the Amplatz wire into the aorta and  then exchanged the sheath for a long 6-Fr sheath.  I loaded a 6 mm x 80 mm bland angioplasty balloon over the wire and centered it on the stent.  This balloon was inflated to 6 ATM for one minute in the stent to treat in-stent restenosis.  An obvious waist was visualized.  I deflated this balloon and exchanged it for a drug coated angioplasty balloon (6 mm x 80 mm).  This was inflated to 8 atm for 2 minutes.  The balloon was deflated and removed.  I did a hand injection to image the iliofemoral arterial system.  This appeared to be widely patent with resolution of the prior irregularity.  The femorofemoral graft appeared to be widely patent.  I removed the wire.  The sheath was aspirated.  No clots were present and the sheath was reloaded with heparinized saline.     COMPLICATIONS: none  CONDITION: stable   Adele Barthel, MD, [facility] [place] Office: [phone] Pager: [phone]  12/31/2016, 8:21 AM

## 2016-12-31 NOTE — Progress Notes (Signed)
Site area: Left groin a 6 french long arterial sheath was removed  Site Prior to Removal:  Level 0  Pressure Applied For 20 MINUTES    Bedrest Beginning at 1000am  Manual:   Yes.    Patient Status During Pull:  stable  Post Pull Groin Site:  Level 0  Post Pull Instructions Given:  Yes.    Post Pull Pulses Present:  Yes.    Dressing Applied:  Yes.    Comments:  VAS remain stablelduring sheath pull

## 2016-12-31 NOTE — Discharge Instructions (Signed)
Angiogram, Care After °This sheet gives you information about how to care for yourself after your procedure. Your health care provider may also give you more specific instructions. If you have problems or questions, contact your health care provider. °What can I expect after the procedure? °After the procedure, it is common to have bruising and tenderness at the catheter insertion area. °Follow these instructions at home: °Insertion site care  °· Follow instructions from your health care provider about how to take care of your insertion site. Make sure you: °¨ Wash your hands with soap and water before you change your bandage (dressing). If soap and water are not available, use hand sanitizer. °¨ Change your dressing as told by your health care provider. °¨ Leave stitches (sutures), skin glue, or adhesive strips in place. These skin closures may need to stay in place for 2 weeks or longer. If adhesive strip edges start to loosen and curl up, you may trim the loose edges. Do not remove adhesive strips completely unless your health care provider tells you to do that. °· Do not take baths, swim, or use a hot tub until your health care provider approves. °· You may shower 24-48 hours after the procedure or as told by your health care provider. °¨ Gently wash the site with plain soap and water. °¨ Pat the area dry with a clean towel. °¨ Do not rub the site. This may cause bleeding. °· Do not apply powder or lotion to the site. Keep the site clean and dry. °· Check your insertion site every day for signs of infection. Check for: °¨ Redness, swelling, or pain. °¨ Fluid or blood. °¨ Warmth. °¨ Pus or a bad smell. °Activity  °· Rest as told by your health care provider, usually for 1-2 days. °· Do not lift anything that is heavier than 10 lbs. (4.5 kg) or as told by your health care provider. °· Do not drive for 24 hours if you were given a medicine to help you relax (sedative). °· Do not drive or use heavy machinery while  taking prescription pain medicine. °General instructions  °· Return to your normal activities as told by your health care provider, usually in about a week. Ask your health care provider what activities are safe for you. °· If the catheter site starts bleeding, lie flat and put pressure on the site. If the bleeding does not stop, get help right away. This is a medical emergency. °· Drink enough fluid to keep your urine clear or pale yellow. This helps flush the contrast dye from your body. °· Take over-the-counter and prescription medicines only as told by your health care provider. °· Keep all follow-up visits as told by your health care provider. This is important. °Contact a health care provider if: °· You have a fever or chills. °· You have redness, swelling, or pain around your insertion site. °· You have fluid or blood coming from your insertion site. °· The insertion site feels warm to the touch. °· You have pus or a bad smell coming from your insertion site. °· You have bruising around the insertion site. °· You notice blood collecting in the tissue around the catheter site (hematoma). The hematoma may be painful to the touch. °Get help right away if: °· You have severe pain at the catheter insertion area. °· The catheter insertion area swells very fast. °· The catheter insertion area is bleeding, and the bleeding does not stop when you hold steady pressure on   the area. °· The area near or just beyond the catheter insertion site becomes pale, cool, tingly, or numb. °These symptoms may represent a serious problem that is an emergency. Do not wait to see if the symptoms will go away. Get medical help right away. Call your local emergency services (911 in the [place] Do not drive yourself to the hospital. °Summary °· After the procedure, it is common to have bruising and tenderness at the catheter insertion area. °· After the procedure, it is important to rest and drink plenty of fluids. °· Do not take baths,  swim, or use a hot tub until your health care provider says it is okay to do so. You may shower 24-48 hours after the procedure or as told by your health care provider. °· If the catheter site starts bleeding, lie flat and put pressure on the site. If the bleeding does not stop, get help right away. This is a medical emergency. °This information is not intended to replace advice given to you by your health care provider. Make sure you discuss any questions you have with your health care provider. °Document Released: 03/05/2005 Document Revised: 07/22/2016 Document Reviewed: 07/22/2016 °Elsevier Interactive Patient Education © 2017 [employer] ° °

## 2017-01-01 ENCOUNTER — Telehealth: Payer: Self-pay | Admitting: Vascular Surgery

## 2017-01-01 NOTE — Telephone Encounter (Signed)
-----   Message from Denman George, RN sent at 12/31/2016  1:00 PM EDT ----- Regarding: needs 4-6 wk. f/u with Dr. Bridgett Larsson   ----- Message ----- From: Conrad [place], MD Sent: 12/31/2016   8:34 AM To: Vvs Charge Pool  Austin Espinoza [id number] [birth year]  PROCEDURE: 1.  Left common femoral artery cannulation under ultrasound guidance 2.  Placement of catheter in aorta 3.  Aortogram 4.  Conscious sedation for 43 minutes 5.  Limited bilateral leg runoff via catheter  6.  Drug coat angioplasty of left common iliac artery and external iliac artery   Follow-up: 4-6 weeks

## 2017-01-01 NOTE — Telephone Encounter (Signed)
Sched appt 01/29/17 at 2:45. Lm on hm# to inform pt of appt.

## 2017-01-14 DIAGNOSIS — I110 Hypertensive heart disease with heart failure: Secondary | ICD-10-CM | POA: Insufficient documentation

## 2017-01-14 DIAGNOSIS — I340 Nonrheumatic mitral (valve) insufficiency: Secondary | ICD-10-CM

## 2017-01-14 HISTORY — DX: Hypertensive heart disease with heart failure: I11.0

## 2017-01-14 HISTORY — DX: Nonrheumatic mitral (valve) insufficiency: I34.0

## 2017-01-20 ENCOUNTER — Encounter: Payer: Self-pay | Admitting: Vascular Surgery

## 2017-01-26 NOTE — Progress Notes (Signed)
Postoperative Visit   History of Present Illness   Austin Espinoza is a 61 y.o. male who presents for postoperative follow-up from procedure: DCB L CIA and EIA (12/31/16).    Prior procedures include:  1.  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  R CFA EA, L to R fem-fem BPG w/ Dacron, TE fem-fem, L iliofem EA w/ BPA, redo L to R fem-fem BPG  The patient states he is ambulating well.  He does not have any non healing wounds.  He is concerned about some darkening of his skin around his ankles bilaterally.   He denies any leg swelling of either leg.  He does continue to smoke.  He is unsure if he has an irregular rhythm and is going to see his cardiologist.  He states he has had some lightheadedness.  He  He takes oral agent metformin for his DM.  The pt is on a statin for cholesterol management.  He is on a beta blocker and ACEI for blood pressure management.  He is on Eliquis.    Past Medical History, Past Surgical History, Social History, Family History, Medications, Allergies, and Review of Systems are unchanged from previous evaluation on 12/31/16.  Current Outpatient Prescriptions  Medication Sig Dispense Refill  . allopurinol (ZYLOPRIM) 300 MG tablet Take 300 mg by mouth every evening.    Marland Kitchen apixaban (ELIQUIS) 5 MG TABS tablet Take 5 mg by mouth 2 (two) times daily.    Marland Kitchen BEVESPI AEROSPHERE 9-4.8 MCG/ACT AERO Inhale 2 puffs into the lungs at bedtime as needed for shortness of breath.    . carvedilol (COREG) 6.25 MG tablet Take 6.25 mg by mouth 2 (two) times daily.    . cholecalciferol (VITAMIN D) 400 units TABS tablet Take 400 Units by mouth every evening.    . gabapentin (NEURONTIN) 300 MG capsule Take 600 mg by mouth 3 (three) times daily.     Marland Kitchen lisinopril (PRINIVIL,ZESTRIL) 20 MG tablet TAKE ONE TABLET BY MOUTH ONCE DAILY. NEEDS  APPOINTMENT  FOR  FURTHER  REFILLS. (Patient taking differently: Take  20mg  by mouth once daily) 7 tablet 0  . metFORMIN (GLUCOPHAGE) 500 MG tablet Take 500 mg by mouth 2 (two) times daily with a meal.     . rosuvastatin (CRESTOR) 10 MG tablet Take 10 mg by mouth daily.     No current facility-administered medications for this visit.     ROS: 10 point review of systems,see HPI   For VQI Use Only   PRE-ADM LIVING: Home  AMB STATUS: Ambulatory   Physical Examination   Vitals:   01/29/17 1423  BP: 133/63  Pulse: 72  Resp: 18  Temp: 97.2 F (36.2 C)   Vitals:   01/29/17 1423  Weight: 175 lb (79.4 kg)  Height: 5\' 9"  (1.753 m)    General Alert, O x 3, WD, NAD  Pulmonary Sym exp, good B air movt, non labored  Cardiac Regular   Vascular Vessel Right Left  Femoral Palpable Palpable  PT Faintly palpable Faintly palpable  DP Palpable Palpable       Musculoskeletal M/S 5/5 throughout  , Extremities without ischemic changes  , No edema present,  Neurologic Pain and light touch intact in extremities , Motor exam as listed above    Medical Decision Making   Austin Espinoza is a 61 y.o. male who presents s/p  1.  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  R CFA EA, L to R fem-fem BPG w/ Dacron, TE fem-fem, L iliofem EA w/ BPA, redo L to R fem-fem BPG  Pt does have palpable DP pulses bilaterally and faintly palpable PT pulses bilaterally.   Will have him f/u in 3 months with aortoiliac duplex and ABI's.  He does have some discoloration anteriorly at the ankles bilaterally, which would be consistent with venous disease, however, he does not have any leg swelling bilaterally and no varicosities.  Dr. Bridgett Larsson has advised him to see his PCP as he could possibly have a fungal infection.  This hyperpigmentation could be representative of his eczema, however, the distribution would be unusual.   I discussed in depth with the patient the nature of atherosclerosis, and emphasized  the importance of maximal medical management including strict control of blood pressure, blood glucose, and lipid levels, obtaining regular exercise, and cessation of smoking.  The patient is aware that without maximal medical management the underlying atherosclerotic disease process will progress, limiting the benefit of any interventions. The patient is currently on a statin: Crestor.  The patient is currently not on an anti-platelet due to bleeding risks related to use of an anticoagulant.  Pt is on Eliquis.  Thank you for allowing [place] to participate in this patient's care.  Leontine Locket, [facility] 01/29/2017 2:52 PM  Addendum  I have independently interviewed and examined the patient, and I agree with the physician assistant's findings.  No hematoma from L CFA cannulation.  Surveillance studies as listed above.  Will recheck pt in 3 months.  Adele Barthel, MD, FACS [facility] [place] Office: [phone] Pager: [phone]  01/29/2017, 2:55 PM

## 2017-01-29 ENCOUNTER — Encounter: Payer: Self-pay | Admitting: Vascular Surgery

## 2017-01-29 ENCOUNTER — Ambulatory Visit (INDEPENDENT_AMBULATORY_CARE_PROVIDER_SITE_OTHER): Payer: BLUE CROSS/BLUE SHIELD | Admitting: Vascular Surgery

## 2017-01-29 VITALS — BP 133/63 | HR 72 | Temp 97.2°F | Resp 18 | Ht 69.0 in | Wt 175.0 lb

## 2017-01-29 DIAGNOSIS — I998 Other disorder of circulatory system: Secondary | ICD-10-CM | POA: Diagnosis not present

## 2017-01-29 DIAGNOSIS — I70229 Atherosclerosis of native arteries of extremities with rest pain, unspecified extremity: Secondary | ICD-10-CM

## 2017-04-15 DIAGNOSIS — Z955 Presence of coronary angioplasty implant and graft: Secondary | ICD-10-CM

## 2017-04-15 DIAGNOSIS — R9439 Abnormal result of other cardiovascular function study: Secondary | ICD-10-CM | POA: Insufficient documentation

## 2017-04-15 DIAGNOSIS — I252 Old myocardial infarction: Secondary | ICD-10-CM | POA: Insufficient documentation

## 2017-04-15 HISTORY — DX: Abnormal result of other cardiovascular function study: R94.39

## 2017-04-15 HISTORY — DX: Presence of coronary angioplasty implant and graft: Z95.5

## 2017-04-15 HISTORY — DX: Old myocardial infarction: I25.2

## 2017-05-14 NOTE — Addendum Note (Signed)
Addended by: Lianne Cure A on: 05/14/2017 11:10 AM   Modules accepted: Orders

## 2017-05-24 NOTE — Progress Notes (Signed)
Established Critical Limb Ischemia Patient   History of Present Illness   Austin Espinoza is a 61 y.o. ([birth year]) male who presents with chief complaint: routine follow up.    Prior procedures include:  1.  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  R CFA EA, L to R fem-fem BPG w/ Dacron, TE fem-fem, L iliofem EA w/ BPA, redo L to R fem-fem BPG  The patient has no rest pain.  The patient notes symptoms have not progressed.  The patient's treatment regimen currently included: maximal medical management.  The patient's PMH, PSH, SH, and FamHx are unchanged from 01/29/17.  Current Outpatient Prescriptions  Medication Sig Dispense Refill  . allopurinol (ZYLOPRIM) 300 MG tablet Take 300 mg by mouth every evening.    Marland Kitchen apixaban (ELIQUIS) 5 MG TABS tablet Take 5 mg by mouth 2 (two) times daily.    Marland Kitchen BEVESPI AEROSPHERE 9-4.8 MCG/ACT AERO Inhale 2 puffs into the lungs at bedtime as needed for shortness of breath.    . carvedilol (COREG) 6.25 MG tablet Take 6.25 mg by mouth 2 (two) times daily.    . cholecalciferol (VITAMIN D) 400 units TABS tablet Take 400 Units by mouth every evening.    . gabapentin (NEURONTIN) 300 MG capsule Take 600 mg by mouth 3 (three) times daily.     Marland Kitchen lisinopril (PRINIVIL,ZESTRIL) 20 MG tablet TAKE ONE TABLET BY MOUTH ONCE DAILY. NEEDS  APPOINTMENT  FOR  FURTHER  REFILLS. (Patient taking differently: Take 20mg  by mouth once daily) 7 tablet 0  . metFORMIN (GLUCOPHAGE) 500 MG tablet Take 500 mg by mouth 2 (two) times daily with a meal.     . rosuvastatin (CRESTOR) 10 MG tablet Take 10 mg by mouth daily.     No current facility-administered medications for this visit.     On ROS today: no rest pain, no intermittent claudication    Physical Examination   Vitals:   05/28/17 1035  BP: (!) 146/76  Pulse: 65  SpO2: 100%  Weight: 168 lb 9.6 oz (76.5 kg)  Height: 5\' 8"  (1.727 m)    Body mass index is 25.64 kg/m.  General Alert, O x 3, WD, NAD  Pulmonary Sym exp, good B air movt, CTA B  Cardiac RRR, Nl S1, S2, no Murmurs, No rubs, No S3,S4  Vascular Vessel Right Left  Radial Palpable Palpable  Brachial Palpable Palpable  Carotid Palpable, No Bruit Palpable, No Bruit  Aorta Not palpable N/A  Femoral Palpable, palpable fem-fem BPG Palpable  Popliteal Not palpable Not palpable  PT Palpable Palpable  DP Palpable Palpable    Gastro- intestinal soft, non-distended, non-tender to palpation, No guarding or rebound, no HSM, no masses, no CVAT B, No palpable prominent aortic pulse,    Musculo- skeletal M/S 5/5 throughout  , Extremities without ischemic changes  , No edema present, No visible varicosities , No Lipodermatosclerosis present  Neurologic Cranial nerves 2-12 intact , Pain and light touch intact in extremities , Motor exam as listed above    Non-Invasive Vascular Imaging   ABI (05/28/2017)  R:   ABI: 0.96 (0.90),   PT: tri  DP: tri  TBI:  0.46  L:   ABI: 0.96 (0.93),   PT: tri  DP: tri  TBI: 0.61   Aortoilac duplex (05/28/2017)  Widely patent L CIA and EIA stents  Widely patent fem-fem BPG  L CIA 337 c/s  Medical Decision Making   Austin Espinoza is a 61 y.o. male who presents with: RLE critical limb ischemia, LLE intermittent claudication   Prior procedures include: 1.  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  R CFA EA, L to R fem-fem BPG w/ Dacron, TE fem-fem, L iliofem EA w/ BPA, redo L to R fem-fem BPG   Velocities in L iliac system are similar to prior study before L iliac DCB.  Angiogram demonstrated disease without hemodynamically significant stenosis.  Based on the patient's vascular studies and examination, I have offered the patient: q3 month ABI and aortoiliac duplex.  I discussed in depth with the patient the nature of  atherosclerosis, and emphasized the importance of maximal medical management including strict control of blood pressure, blood glucose, and lipid levels, antiplatelet agents, obtaining regular exercise, and cessation of smoking.    The patient is aware that without maximal medical management the underlying atherosclerotic disease process will progress, limiting the benefit of any interventions. The patient is currently on a statin: Crestor.  The patient is currently on an anti-platelet: ASA.  Thank you for allowing [place] to participate in this patient's care.   Adele Barthel, MD, FACS [facility] Office: [phone] Pager: [phone]

## 2017-05-28 ENCOUNTER — Encounter: Payer: Self-pay | Admitting: Vascular Surgery

## 2017-05-28 ENCOUNTER — Ambulatory Visit (HOSPITAL_COMMUNITY)
Admission: RE | Admit: 2017-05-28 | Discharge: 2017-05-28 | Disposition: A | Discharge status: Home / Self Care | Payer: BLUE CROSS/BLUE SHIELD | Source: Ambulatory Visit | Attending: Vascular Surgery | Admitting: Vascular Surgery

## 2017-05-28 ENCOUNTER — Ambulatory Visit (INDEPENDENT_AMBULATORY_CARE_PROVIDER_SITE_OTHER)
Admission: RE | Admit: 2017-05-28 | Discharge: 2017-05-28 | Disposition: A | Discharge status: Home / Self Care | Payer: BLUE CROSS/BLUE SHIELD | Source: Ambulatory Visit | Attending: Vascular Surgery | Admitting: Vascular Surgery

## 2017-05-28 ENCOUNTER — Ambulatory Visit (INDEPENDENT_AMBULATORY_CARE_PROVIDER_SITE_OTHER): Payer: BLUE CROSS/BLUE SHIELD | Admitting: Vascular Surgery

## 2017-05-28 VITALS — BP 146/76 | HR 65 | Ht 68.0 in | Wt 168.6 lb

## 2017-05-28 DIAGNOSIS — I998 Other disorder of circulatory system: Secondary | ICD-10-CM | POA: Diagnosis not present

## 2017-05-28 DIAGNOSIS — I10 Essential (primary) hypertension: Secondary | ICD-10-CM | POA: Insufficient documentation

## 2017-05-28 DIAGNOSIS — E785 Hyperlipidemia, unspecified: Secondary | ICD-10-CM | POA: Diagnosis not present

## 2017-05-28 DIAGNOSIS — I739 Peripheral vascular disease, unspecified: Secondary | ICD-10-CM | POA: Diagnosis not present

## 2017-05-28 DIAGNOSIS — Z720 Tobacco use: Secondary | ICD-10-CM | POA: Insufficient documentation

## 2017-05-28 DIAGNOSIS — I70229 Atherosclerosis of native arteries of extremities with rest pain, unspecified extremity: Secondary | ICD-10-CM

## 2017-05-28 DIAGNOSIS — E1151 Type 2 diabetes mellitus with diabetic peripheral angiopathy without gangrene: Secondary | ICD-10-CM | POA: Diagnosis not present

## 2017-05-28 DIAGNOSIS — R938 Abnormal findings on diagnostic imaging of other specified body structures: Secondary | ICD-10-CM | POA: Insufficient documentation

## 2017-05-28 DIAGNOSIS — R0989 Other specified symptoms and signs involving the circulatory and respiratory systems: Secondary | ICD-10-CM | POA: Diagnosis present

## 2017-06-01 NOTE — Addendum Note (Signed)
Addended by: Lianne Cure A on: 06/01/2017 02:54 PM   Modules accepted: Orders

## 2017-09-08 NOTE — Progress Notes (Signed)
Established Critical Limb Ischemia Patient   History of Present Illness   Austin Espinoza is a 62 y.o. (10-Jul-1956) male who presents with chief complaint: cold symptoms.    Prior procedures include:  1. 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 R CFA EA, L to R fem-fem BPG w/ Dacron, TE fem-fem, L iliofem EA w/ BPA, redo L to R fem-fem BPG  The patient has no rest pain.  The patient notes symptoms have not progressed.  The patient's treatment regimen currently included: maximal medical management.  His sx today are chills and coughing.  He recently caught a URI from his brother.  The patient's PMH, PSH, SH, and FamHx are unchanged from 05/28/17.  Current Outpatient Medications  Medication Sig Dispense Refill  . allopurinol (ZYLOPRIM) 300 MG tablet Take 300 mg by mouth every evening.    Marland Kitchen apixaban (ELIQUIS) 5 MG TABS tablet Take 5 mg by mouth 2 (two) times daily.    Marland Kitchen BEVESPI AEROSPHERE 9-4.8 MCG/ACT AERO Inhale 2 puffs into the lungs at bedtime as needed for shortness of breath.    . carvedilol (COREG) 6.25 MG tablet Take 6.25 mg by mouth 2 (two) times daily.    . cholecalciferol (VITAMIN D) 400 units TABS tablet Take 400 Units by mouth every evening.    . gabapentin (NEURONTIN) 300 MG capsule Take 600 mg by mouth 3 (three) times daily.     Marland Kitchen lisinopril (PRINIVIL,ZESTRIL) 20 MG tablet TAKE ONE TABLET BY MOUTH ONCE DAILY. NEEDS  APPOINTMENT  FOR  FURTHER  REFILLS. (Patient taking differently: Take 20mg  by mouth once daily) 7 tablet 0  . metFORMIN (GLUCOPHAGE) 500 MG tablet Take 500 mg by mouth 2 (two) times daily with a meal.     . rosuvastatin (CRESTOR) 10 MG tablet Take 10 mg by mouth daily.     No current facility-administered medications for this visit.     On ROS today: chills, cough, weight loss   Physical Examination   Vitals:   09/10/17 0937  BP: 128/71  Pulse: 73  Resp:  20  Temp: 98.3 F (36.8 C)  TempSrc: Oral  SpO2: 100%  Weight: 167 lb (75.8 kg)  Height: 5\' 8"  (1.727 m)   Body mass index is 25.39 kg/m.  General Alert, O x 3, WD, NAD  Pulmonary Sym exp, good B air movt, CTA B  Cardiac RRR, Nl S1, S2, no Murmurs, No rubs, No S3,S4  Vascular Vessel Right Left  Radial Palpable Palpable  Brachial Palpable Palpable  Carotid Palpable, No Bruit Palpable, No Bruit  Aorta Not palpable N/A  Femoral Palpable, palpable fem-fem BPG Palpable  Popliteal Not palpable Not palpable  PT Palpable Palpable  DP Palpable Palpable    Gastro- intestinal soft, non-distended, non-tender to palpation, No guarding or rebound, no HSM, no masses, no CVAT B, No palpable prominent aortic pulse,    Musculo- skeletal M/S 5/5 throughout, Extremities without ischemic changes  , No edema present, No visible varicosities , No Lipodermatosclerosis present  Neurologic Pain and light touch intact in extremities , Motor exam as listed above    Non-invasive Vascular Imaging   ABI (09/10/2017)  R:   ABI: 0.99 (0.96),   PT: tri  DP: mono  TBI:  0.48  L:   ABI: 0.87 (0.96),   PT: bi  DP: mono  TBI: 0.61  Aortoilac duplex (09/10/2017)  Widely  patent L CIA and EIA stents  Widely patent fem-fem BPG  L CIA 328 c/s (337 c/s)   Medical Decision Making   Austin Espinoza is a 62 y.o. (1956/02/15) male who presents with: RLE critical limb ischemia, LLE intermittent claudication  Prior procedures include: 1. 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 R CFA EA, L to R fem-fem BPG w/ Dacron, TE fem-fem, L iliofem EA w/ BPA, redo L to R fem-fem BPG   Aortoiliac duplex are not significant different from last time.    I reviewed the prior angiogram from May 5374, and I'm not certain what is being measured at 328 c/s.  The proximal common iliac artery is disease but <50%.  Based  on the patient's vascular studies and examination, I have offered the patient: q6 month ABI and aortoiliac duplex.  I discussed in depth with the patient the nature of atherosclerosis, and emphasized the importance of maximal medical management including strict control of blood pressure, blood glucose, and lipid levels, antiplatelet agents, obtaining regular exercise, and cessation of smoking.    The patient is aware that without maximal medical management the underlying atherosclerotic disease process will progress, limiting the benefit of any interventions.  The patient is currently on a statin: Crestor.   The patient is currently on an anti-platelet: ASA.  Thank you for allowing [place] to participate in this patient's care.   Adele Barthel, MD, FACS [facility] Office: [phone] Pager: [phone]

## 2017-09-10 ENCOUNTER — Ambulatory Visit (INDEPENDENT_AMBULATORY_CARE_PROVIDER_SITE_OTHER)
Admission: RE | Admit: 2017-09-10 | Discharge: 2017-09-10 | Disposition: A | Discharge status: Home / Self Care | Payer: BLUE CROSS/BLUE SHIELD | Source: Ambulatory Visit | Attending: Vascular Surgery | Admitting: Vascular Surgery

## 2017-09-10 ENCOUNTER — Encounter: Payer: Self-pay | Admitting: Vascular Surgery

## 2017-09-10 ENCOUNTER — Ambulatory Visit (HOSPITAL_COMMUNITY)
Admission: RE | Admit: 2017-09-10 | Discharge: 2017-09-10 | Disposition: A | Discharge status: Home / Self Care | Payer: BLUE CROSS/BLUE SHIELD | Source: Ambulatory Visit | Attending: Vascular Surgery | Admitting: Vascular Surgery

## 2017-09-10 ENCOUNTER — Ambulatory Visit (INDEPENDENT_AMBULATORY_CARE_PROVIDER_SITE_OTHER): Payer: BLUE CROSS/BLUE SHIELD | Admitting: Vascular Surgery

## 2017-09-10 VITALS — BP 128/71 | HR 73 | Temp 98.3°F | Resp 20 | Ht 68.0 in | Wt 167.0 lb

## 2017-09-10 DIAGNOSIS — E119 Type 2 diabetes mellitus without complications: Secondary | ICD-10-CM | POA: Diagnosis not present

## 2017-09-10 DIAGNOSIS — I70229 Atherosclerosis of native arteries of extremities with rest pain, unspecified extremity: Secondary | ICD-10-CM

## 2017-09-10 DIAGNOSIS — I739 Peripheral vascular disease, unspecified: Secondary | ICD-10-CM | POA: Diagnosis not present

## 2017-09-10 DIAGNOSIS — I10 Essential (primary) hypertension: Secondary | ICD-10-CM | POA: Insufficient documentation

## 2017-09-10 DIAGNOSIS — I998 Other disorder of circulatory system: Secondary | ICD-10-CM

## 2017-09-10 DIAGNOSIS — F17200 Nicotine dependence, unspecified, uncomplicated: Secondary | ICD-10-CM | POA: Diagnosis not present

## 2017-09-10 DIAGNOSIS — Z95828 Presence of other vascular implants and grafts: Secondary | ICD-10-CM | POA: Diagnosis not present

## 2017-09-13 NOTE — Addendum Note (Signed)
Addended by: Lianne Cure A on: 09/13/2017 02:15 PM   Modules accepted: Orders

## 2017-11-12 ENCOUNTER — Inpatient Hospital Stay
Admission: AD | Admit: 2017-11-12 | Payer: Self-pay | Source: Other Acute Inpatient Hospital | Admitting: Family Medicine

## 2017-12-22 DIAGNOSIS — D500 Iron deficiency anemia secondary to blood loss (chronic): Secondary | ICD-10-CM

## 2017-12-22 HISTORY — DX: Iron deficiency anemia secondary to blood loss (chronic): D50.0

## 2018-01-31 DIAGNOSIS — R042 Hemoptysis: Secondary | ICD-10-CM | POA: Diagnosis not present

## 2018-03-04 ENCOUNTER — Other Ambulatory Visit: Payer: Self-pay

## 2018-03-04 DIAGNOSIS — I998 Other disorder of circulatory system: Secondary | ICD-10-CM

## 2018-03-04 DIAGNOSIS — I739 Peripheral vascular disease, unspecified: Secondary | ICD-10-CM

## 2018-03-04 DIAGNOSIS — I70229 Atherosclerosis of native arteries of extremities with rest pain, unspecified extremity: Secondary | ICD-10-CM

## 2018-03-15 NOTE — Progress Notes (Signed)
Established Previous Bypass   History of Present Illness   Austin Espinoza is a 62 y.o. (23-Oct-1955) male who presents with chief complaint: no complaints.    Prior procedures include: 1. 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 PTA+S L CIA and EIA (07/18/15) for disease L EIA only 2-3 mm 3. R CFA EA, L-to-R fem-fem BPG w/ Dacron, TE fem-fem, L iliofem EA w/ BPA, redp L to R fem-fem BPG   The patient's symptoms have not progressed.  The patient's symptoms are: none. The patient's treatment regimen currently included: maximal medical management.  The patient's PMH, PSH, SH, and FamHx were reviewed on 03/16/18 are unchanged from 09/10/17.  Current Outpatient Medications  Medication Sig Dispense Refill  . allopurinol (ZYLOPRIM) 300 MG tablet Take 300 mg by mouth every evening.    Marland Kitchen BEVESPI AEROSPHERE 9-4.8 MCG/ACT AERO Inhale 2 puffs into the lungs at bedtime as needed for shortness of breath.    . carvedilol (COREG) 6.25 MG tablet Take 6.25 mg by mouth 2 (two) times daily.    . cholecalciferol (VITAMIN D) 400 units TABS tablet Take 400 Units by mouth every evening.    . gabapentin (NEURONTIN) 300 MG capsule Take 600 mg by mouth 3 (three) times daily.     Marland Kitchen lisinopril (PRINIVIL,ZESTRIL) 20 MG tablet TAKE ONE TABLET BY MOUTH ONCE DAILY. NEEDS  APPOINTMENT  FOR  FURTHER  REFILLS. (Patient taking differently: Take 20mg  by mouth once daily) 7 tablet 0  . metFORMIN (GLUCOPHAGE) 500 MG tablet Take 500 mg by mouth 2 (two) times daily with a meal.     . rosuvastatin (CRESTOR) 10 MG tablet Take 10 mg by mouth daily.     No current facility-administered medications for this visit.     On ROS today: no intermittent claudication , no wounds   Physical Examination   Vitals:   03/16/18 1017  BP: (!) 162/76  Pulse: 60  Resp: 16  Temp: (!) 97 F (36.1 C)  TempSrc: Oral  SpO2: 100%  Weight: 169 lb (76.7 kg)  Height: 5\' 8"  (1.727 m)   Body mass index is 25.7  kg/m.  General Alert, O x 3, WD, NAD  Pulmonary Sym exp, good B air movt, CTA B  Cardiac RRR, Nl S1, S2, no Murmurs, No rubs, No S3,S4  Vascular Vessel Right Left  Radial Palpable Palpable  Brachial Palpable Palpable  Carotid Palpable, No Bruit Palpable, No Bruit  Aorta Not palpable N/A  Femoral Palpable Palpable  Popliteal Not palpable Not palpable  PT Faintly palpable Faintly palpable  DP Palpable Palpable    Gastro- intestinal soft, non-distended, non-tender to palpation, No guarding or rebound, no HSM, no masses, no CVAT B, No palpable prominent aortic pulse,    Musculo- skeletal M/S 5/5 throughout  , Extremities without ischemic changes  , No edema present, No visible varicosities , No Lipodermatosclerosis present, palpable fem-fem pulse  Neurologic Pain and light touch intact in extremities , Motor exam as listed above    Non-Invasive Vascular Imaging ABI (03/16/2018)  R:   ABI: 0.98 (0.99),   PT: bi  DP: bi  TBI:  0.66  L:   ABI: 0.90 (0.87),   PT: bi  DP: mono  TBI: 0.50  Aortoiliac Duplex (03/16/2018)  Sub-optimal study per tech  Ao: 49 c/s  R iliac: EIA still occluded  L iliac: CIA 389-454 c/s, EIA 235-328 c/s  EIA visualized, CIA not well visualized  Fem-fem Duplex (03/16/2018)  Patent bypass with biphasic waveforms   Medical Decision Making   Austin Espinoza is a 62 y.o. male who presents with: RLE CLI, LLE intermittent claudication, multiple L CIA and EIA endovascular interventions, L-to-R fem-fem BPG complicated by on the table occlusion requiring immediate redo   Reviewing the prior aortoiliac duplex, the PSV don't look that much difference.  Tech for today's study cold not visualize much of the stent, so I don't think the PSV today are valid.  I would repeat the study in 3 months as the fem-fem graft flow is still biphasic.  If there was significant in-stent restenosis on the left, I would expect the flow to deteriorate to monophasic  flow.  Based on the patient's vascular studies and examination, I have offered the patient: BLE ABI, bypass duplex, and aortoiliac duplex in 3 months.  I discussed in depth with the patient the nature of atherosclerosis, and emphasized the importance of maximal medical management including strict control of blood pressure, blood glucose, and lipid levels, obtaining regular exercise, and cessation of smoking.    The patient is aware that without maximal medical management the underlying atherosclerotic disease process will progress, limiting the benefit of any interventions.  I discussed in depth with the patient the need for long term surveillance to improve the primary assisted patency of his bypass.  The patient agrees to cooperate with such.  The patient is currently on a statin: Crestor.   The patient is currently on an anti-platelet: ASA.  Thank you for allowing [place] to participate in this patient's care.   Adele Barthel, MD, FACS [facility] Office: [phone] Pager: [phone]

## 2018-03-16 ENCOUNTER — Ambulatory Visit (INDEPENDENT_AMBULATORY_CARE_PROVIDER_SITE_OTHER)
Admission: RE | Admit: 2018-03-16 | Discharge: 2018-03-16 | Disposition: A | Discharge status: Home / Self Care | Payer: BLUE CROSS/BLUE SHIELD | Source: Ambulatory Visit | Attending: Vascular Surgery | Admitting: Vascular Surgery

## 2018-03-16 ENCOUNTER — Ambulatory Visit (HOSPITAL_COMMUNITY)
Admission: RE | Admit: 2018-03-16 | Discharge: 2018-03-16 | Disposition: A | Discharge status: Home / Self Care | Payer: BLUE CROSS/BLUE SHIELD | Source: Ambulatory Visit | Attending: Vascular Surgery | Admitting: Vascular Surgery

## 2018-03-16 ENCOUNTER — Other Ambulatory Visit: Payer: Self-pay

## 2018-03-16 ENCOUNTER — Encounter: Payer: Self-pay | Admitting: Vascular Surgery

## 2018-03-16 ENCOUNTER — Ambulatory Visit (INDEPENDENT_AMBULATORY_CARE_PROVIDER_SITE_OTHER): Payer: BLUE CROSS/BLUE SHIELD | Admitting: Vascular Surgery

## 2018-03-16 VITALS — BP 162/76 | HR 60 | Temp 97.0°F | Resp 16 | Ht 68.0 in | Wt 169.0 lb

## 2018-03-16 DIAGNOSIS — I998 Other disorder of circulatory system: Secondary | ICD-10-CM

## 2018-03-16 DIAGNOSIS — I739 Peripheral vascular disease, unspecified: Secondary | ICD-10-CM | POA: Insufficient documentation

## 2018-03-16 DIAGNOSIS — I70229 Atherosclerosis of native arteries of extremities with rest pain, unspecified extremity: Secondary | ICD-10-CM

## 2018-03-17 ENCOUNTER — Other Ambulatory Visit: Payer: Self-pay

## 2018-03-17 DIAGNOSIS — I739 Peripheral vascular disease, unspecified: Secondary | ICD-10-CM

## 2018-03-17 DIAGNOSIS — I70229 Atherosclerosis of native arteries of extremities with rest pain, unspecified extremity: Secondary | ICD-10-CM

## 2018-03-17 DIAGNOSIS — I998 Other disorder of circulatory system: Secondary | ICD-10-CM

## 2018-05-23 DIAGNOSIS — R042 Hemoptysis: Secondary | ICD-10-CM | POA: Insufficient documentation

## 2018-05-23 HISTORY — DX: Hemoptysis: R04.2

## 2018-11-01 ENCOUNTER — Other Ambulatory Visit: Payer: Self-pay

## 2018-11-01 DIAGNOSIS — I739 Peripheral vascular disease, unspecified: Secondary | ICD-10-CM

## 2018-11-01 DIAGNOSIS — I70229 Atherosclerosis of native arteries of extremities with rest pain, unspecified extremity: Secondary | ICD-10-CM

## 2018-11-01 DIAGNOSIS — I998 Other disorder of circulatory system: Secondary | ICD-10-CM

## 2018-11-01 NOTE — Progress Notes (Signed)
HISTORY AND PHYSICAL     CC:  follow up. Requesting Provider:  Raelyn Number, MD  HPI: This is a 63 y.o. male who is here today for follow up.  He has hx of: 1. Right common femoral artery endarterectomy 2. Left-to-right femorofemoral bypass with Dacron 3. Open cannulation of left common femoral artery  4. Left iliac angiogram 5. Thrombectomy of femorofemoral graft 6. Left iliofemoral endarterectomy with bovine patch angioplasty 7. Redo Left-to-right femorofemoral bypass with Propaten By Dr. 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  In May 2018, he subsequently underwent aortogram with drug coated angioplasty of the left CIA and EIA also by Dr. Bridgett Larsson.     He was last seen in July 2019 by Dr. Bridgett Larsson and at that time, the pt was doing well.  He had not had any progression of his sx.    The pt returns today for follow up.  He states that his legs get fatigued walking a lot at work (works at [employer] in [place]).  He says when he stops and rests it improves.  He does not have any cramping in his legs and does not have any non healing wounds.  Says he was on Xarelto and he started coughing up blood and his MD in [place] discontinued it so he takes a baby aspirin daily without any issues.  His sx are the same as when he was here at his last visit.  The pt is on a statin for cholesterol management.    The pt is on an aspirin.    Other AC:  none The pt is on BB and ACEI for hypertension.  The pt does not have diabetes. Tobacco hx:  Current-wants to quit   Past Medical History:  Diagnosis Date  . Anemia   . CAD (coronary artery disease)    Bare-metal stent, LAD, 2007, total RCA, right to right collaterals  . Dyslipidemia   . Ejection fraction < 50%    EF 40% in the past  . History of blood transfusion 01/29/2015   post op  . Hyperlipemia   . Hypertension   . PAD (peripheral artery disease) ([place])    Arterial leg Doppler, December 29, 2011,  greater than 50% focal stenosis of the left mid S.FA, right ABI mild range left ABI moderate range  //   consultation by Dr. Angelena Form, Jan 19, 2012. CTA with severe right CIA stenosis, bil SFA.   . Tobacco abuse   . Type II diabetes mellitus ([place])     Past Surgical History:  Procedure Laterality Date  . ABDOMINAL AORTAGRAM N/A 04/06/2012   Procedure: ABDOMINAL Maxcine Ham;  Surgeon: Burnell Blanks, MD;  Location: [facility] CATH LAB;  Service: Cardiovascular;  Laterality: N/A;  . ABDOMINAL AORTOGRAM W/LOWER EXTREMITY N/A 12/31/2016   Procedure: Abdominal Aortogram w/Lower Extremity;  Surgeon: Conrad [place], MD;  Location: [place] CV LAB;  Service: Cardiovascular;  Laterality: N/A;  . COLONOSCOPY W/ POLYPECTOMY    . CORONARY ANGIOPLASTY WITH STENT PLACEMENT     X 1 stent  . ENDARTERECTOMY FEMORAL Bilateral 01/29/2015   Procedure: ENDARTERECTOMY RIGHT FEMORAL ARTERY AND LEFT FEMORAL ARTERY;  Surgeon: Conrad [place], MD;  Location: [place];  Service: Vascular;  Laterality: Bilateral;  . FEMORAL-FEMORAL BYPASS GRAFT Bilateral 01/29/2015   Procedure: BYPASS GRAFT LEFT FEMORAL ARTERY TO RIGHT FEMORAL ARTERY USING GORE PROPATEN GRAFT;  Surgeon: Conrad , MD;  Location: [place];  Service: Vascular;  Laterality: Bilateral;  . INTRAOPERATIVE ARTERIOGRAM Left 01/29/2015   Procedure: INTRA OPERATIVE ARTERIOGRAM, LEFT ILIAC ARTERY;  Surgeon: Conrad Pelzer, MD;  Location: [place];  Service: Vascular;  Laterality: Left;  . PATCH ANGIOPLASTY Left 01/29/2015   Procedure: LEFT FEMORAL ARTERY PATCH ANGIOPLASTY USING VASCUGUARD PATCH;  Surgeon: Conrad [place], MD;  Location: [place];  Service: Vascular;  Laterality: Left;  . PERIPHERAL VASCULAR BALLOON ANGIOPLASTY Left 12/31/2016   Procedure: Peripheral Vascular Balloon Angioplasty;  Surgeon: Conrad [place], MD;  Location: [place] CV LAB;  Service: Cardiovascular;  Laterality: Left;  lt ext iliac  . PERIPHERAL VASCULAR CATHETERIZATION N/A 01/14/2015   Procedure: Abdominal  Aortogram;  Surgeon: Conrad Doon, MD;  Location: [place] CV LAB;  Service: Cardiovascular;  Laterality: N/A;  . PERIPHERAL VASCULAR CATHETERIZATION Bilateral 01/14/2015   Procedure: Lower Extremity Angiography;  Surgeon: Conrad Nogal, MD;  Location: [place] CV LAB;  Service: Cardiovascular;  Laterality: Bilateral;  . PERIPHERAL VASCULAR CATHETERIZATION N/A 07/18/2015   Procedure: Abdominal Aortogram;  Surgeon: Conrad Sadler, MD;  Location: [place] CV LAB;  Service: Cardiovascular;  Laterality: N/A;  . THROMBECTOMY FEMORAL ARTERY Left 01/29/2015   Procedure: THROMBECTOMY LEFT FEMORAL ARTERY;  Surgeon: Conrad [place], MD;  Location: [place];  Service: Vascular;  Laterality: Left;    Allergies  Allergen Reactions  . Ibuprofen Itching and Rash    Itching/rash with Duexis (famotidine and ibuprofen)    Current Outpatient Medications  Medication Sig Dispense Refill  . allopurinol (ZYLOPRIM) 300 MG tablet Take 300 mg by mouth every evening.    Marland Kitchen BEVESPI AEROSPHERE 9-4.8 MCG/ACT AERO Inhale 2 puffs into the lungs at bedtime as needed for shortness of breath.    . carvedilol (COREG) 6.25 MG tablet Take 6.25 mg by mouth 2 (two) times daily.    . cholecalciferol (VITAMIN D) 400 units TABS tablet Take 400 Units by mouth every evening.    . gabapentin (NEURONTIN) 300 MG capsule Take 600 mg by mouth 3 (three) times daily.     Marland Kitchen lisinopril (PRINIVIL,ZESTRIL) 20 MG tablet TAKE ONE TABLET BY MOUTH ONCE DAILY. NEEDS  APPOINTMENT  FOR  FURTHER  REFILLS. (Patient taking differently: Take 20mg  by mouth once daily) 7 tablet 0  . metFORMIN (GLUCOPHAGE) 500 MG tablet Take 500 mg by mouth 2 (two) times daily with a meal.     . rosuvastatin (CRESTOR) 10 MG tablet Take 10 mg by mouth daily.     No current facility-administered medications for this visit.     Family History  Problem Relation Age of Onset  . Heart attack Father   . Heart disease Father   . Diabetes Mother   . Cancer Brother   .  Hypertension Sister     Social History   Socioeconomic History  . Marital status: Divorced    Spouse name: Not on file  . Number of children: 0  . Years of education: Not on file  . Highest education level: Not on file  Occupational History  . Occupation: Works at [employer]  . Financial resource strain: Not on file  . Food insecurity:    Worry: Not on file    Inability: Not on file  . Transportation needs:    Medical: Not on file    Non-medical: Not on file  Tobacco Use  . Smoking status: Light Tobacco Smoker    Packs/day: 0.25    Years: 45.00    Pack years: 11.25  Types: Cigarettes  . Smokeless tobacco: Never Used  . Tobacco comment: 1/2 pk per day  Substance and Sexual Activity  . Alcohol use: No    Alcohol/week: 0.0 standard drinks  . Drug use: No  . Sexual activity: Not Currently  Lifestyle  . Physical activity:    Days per week: Not on file    Minutes per session: Not on file  . Stress: Not on file  Relationships  . Social connections:    Talks on phone: Not on file    Gets together: Not on file    Attends religious service: Not on file    Active member of club or organization: Not on file    Attends meetings of clubs or organizations: Not on file    Relationship status: Not on file  . Intimate partner violence:    Fear of current or ex partner: Not on file    Emotionally abused: Not on file    Physically abused: Not on file    Forced sexual activity: Not on file  Other Topics Concern  . Not on file  Social History Narrative  . Not on file     REVIEW OF SYSTEMS:   [X]  denotes positive finding, [ ]  denotes negative finding Cardiac  Comments:  Chest pain or chest pressure:    Shortness of breath upon exertion: x   Short of breath when lying flat:    Irregular heart rhythm:        Vascular    Pain in calf, thigh, or hip brought on by ambulation: x See HPI  Pain in feet at night that wakes you up from your sleep:  x See HPI  Blood  clot in your veins:    Leg swelling:         Pulmonary    Oxygen at home:    Productive cough:     Wheezing:         Neurologic    Sudden weakness in arms or legs:     Sudden numbness in arms or legs:     Sudden onset of difficulty speaking or slurred speech:    Temporary loss of vision in one eye:     Problems with dizziness:         Gastrointestinal    Blood in stool:     Vomited blood:         Genitourinary    Burning when urinating:     Blood in urine:        Psychiatric    Major depression:         Hematologic    Bleeding problems:    Problems with blood clotting too easily:        Skin    Rashes or ulcers:        Constitutional    Fever or chills:      PHYSICAL EXAMINATION:  Today's Vitals   11/03/18 0858  BP: 127/84  Pulse: 65  Resp: 14  Temp: 98.1 F (36.7 C)  TempSrc: Oral  SpO2: 96%  Weight: 162 lb 7.7 oz (73.7 kg)  Height: 5\' 10"  (1.778 m)   Body mass index is 23.31 kg/m.   General:  WDWN in NAD; vital signs documented above Gait: Not observed HENT: WNL, normocephalic Pulmonary: normal non-labored breathing , without Rales, rhonchi,  wheezing Cardiac: regular HR, without  Murmurs; without carotid bruits Abdomen: soft, NT, no masses Skin: without rashes Vascular Exam/Pulses:  Right Left  Radial 2+ (normal)  2+ (normal)  Femoral 2+ (normal) 2+ (normal)  Popliteal Unable to palpate  Unable to palpate   DP 2+ (normal) 2+ (normal)  PT Unable to palpate  Unable to palpate    Extremities: without ischemic changes, without Gangrene , without cellulitis; without open wounds; fem fem graft is palpable Musculoskeletal: no muscle wasting or atrophy  Neurologic: A&O X 3;  No focal weakness or paresthesias are detected Psychiatric:  The pt has Normal affect.   Non-Invasive Vascular Imaging:   ABI's/TBI's on 11/03/2018: Right:  1.01/0.51 Left:  0.98/0.67  Duplex fem fem bypass graft 11/03/2018: Patent bypass with biphasic waveforms Patent  left EIA stent  -no evidence of restenosis in either  Aortoiliac Duplex (03/16/2018)  Sub-optimal study per tech  Ao: 49 c/s  R iliac: EIA still occluded  L iliac: CIA 389-454 c/s, EIA 235-328 c/s ? EIA visualized, CIA not well visualized  Fem-fem Duplex (03/16/2018)  Patent bypass with biphasic waveforms  Previous ABI's/TBI's ABI (03/16/2018)  R:  ? ABI: 0.98 (0.99),  ? PT: bi ? DP: bi ? TBI:  0.66  L:  ? ABI: 0.90 (0.87),  ? PT: bi ? DP: mono ? TBI: 0.50 ?  ASSESSMENT/PLAN:: 63 y.o. male here for follow up for hx of RLE CLI, LLE intermittent claudication, multiple L CIA and EIA endovascular interventions, L-to-R fem-fem BPG complicated by on the table occlusion requiring immediate redo  -pt doing well and ABI's are good and left to right femoral to femoral bypass graft and left EIA stent are patent without stenosis.  -he states he was on Xarelto and other blood thinners in the past, but developed hemoptysis and he was taken off this and on a baby aspirin daily and is tolerating this.  Continue statin also.  -pt c/o tiredness in his legs after walking a great deal, but no complaints of claudication or non healing wounds.   -he will continue his walking regimen and working -discussed smoking cessation with pt -f/u in 6 months with repeat ABI's and duplex of fem-fem and left EIA stent. He will call sooner should he have any issues.    Leontine Locket, PA-C [facility] [phone]  Clinic MD:   Oneida Alar

## 2018-11-03 ENCOUNTER — Other Ambulatory Visit: Payer: Self-pay

## 2018-11-03 ENCOUNTER — Encounter: Payer: Self-pay | Admitting: Physician Assistant

## 2018-11-03 ENCOUNTER — Ambulatory Visit (INDEPENDENT_AMBULATORY_CARE_PROVIDER_SITE_OTHER): Payer: BLUE CROSS/BLUE SHIELD | Admitting: Physician Assistant

## 2018-11-03 ENCOUNTER — Other Ambulatory Visit: Payer: Self-pay | Admitting: Vascular Surgery

## 2018-11-03 ENCOUNTER — Ambulatory Visit (INDEPENDENT_AMBULATORY_CARE_PROVIDER_SITE_OTHER)
Admission: RE | Admit: 2018-11-03 | Discharge: 2018-11-03 | Disposition: A | Discharge status: Home / Self Care | Payer: BLUE CROSS/BLUE SHIELD | Source: Ambulatory Visit | Attending: Vascular Surgery | Admitting: Vascular Surgery

## 2018-11-03 ENCOUNTER — Ambulatory Visit (HOSPITAL_COMMUNITY)
Admission: RE | Admit: 2018-11-03 | Discharge: 2018-11-03 | Disposition: A | Discharge status: Home / Self Care | Payer: BLUE CROSS/BLUE SHIELD | Source: Ambulatory Visit | Attending: Vascular Surgery | Admitting: Vascular Surgery

## 2018-11-03 VITALS — BP 127/84 | HR 65 | Temp 98.1°F | Resp 14 | Ht 70.0 in | Wt 162.5 lb

## 2018-11-03 DIAGNOSIS — I998 Other disorder of circulatory system: Secondary | ICD-10-CM

## 2018-11-03 DIAGNOSIS — I739 Peripheral vascular disease, unspecified: Secondary | ICD-10-CM | POA: Diagnosis present

## 2018-11-03 DIAGNOSIS — I70229 Atherosclerosis of native arteries of extremities with rest pain, unspecified extremity: Secondary | ICD-10-CM

## 2019-01-12 ENCOUNTER — Telehealth (INDEPENDENT_AMBULATORY_CARE_PROVIDER_SITE_OTHER): Payer: PPO | Admitting: Gastroenterology

## 2019-01-12 ENCOUNTER — Encounter: Payer: Self-pay | Admitting: Gastroenterology

## 2019-01-12 ENCOUNTER — Other Ambulatory Visit: Payer: Self-pay

## 2019-01-12 VITALS — Ht 68.0 in | Wt 170.0 lb

## 2019-01-12 DIAGNOSIS — Z86010 Personal history of colonic polyps: Secondary | ICD-10-CM

## 2019-01-12 DIAGNOSIS — Z8371 Family history of colonic polyps: Secondary | ICD-10-CM

## 2019-01-12 NOTE — Patient Instructions (Signed)
If you are age 65 or older, your body mass index should be between 23-30. Your Body mass index is 25.85 kg/m. If this is out of the aforementioned range listed, please consider follow up with your Primary Care Provider.  If you are age 102 or younger, your body mass index should be between 19-25. Your Body mass index is 25.85 kg/m. If this is out of the aformentioned range listed, please consider follow up with your Primary Care Provider.   You have been scheduled for a colonoscopy. Please follow written instructions given to you at your visit today.  Please pick up your prep supplies at the pharmacy within the next 1-3 days. If you use inhalers (even only as needed), please bring them with you on the day of your procedure. Your physician has requested that you go to www.startemmi.com and enter the access code given to you at your visit today. This web site gives a general overview about your procedure. However, you should still follow specific instructions given to you by our office regarding your preparation for the procedure.  To help prevent the possible spread of infection to our patients, communities, and staff; we will be implementing the following measures:  As of now we are not allowing any visitors/family members to accompany you to any upcoming appointments with [facility]. If you have any concerns about this please contact our office to discuss prior to the appointment.   Thank you,  Dr. Jackquline Denmark

## 2019-01-12 NOTE — Addendum Note (Signed)
Addended by: Karena Addison on: 01/12/2019 02:36 PM   Modules accepted: Orders

## 2019-01-12 NOTE — Progress Notes (Signed)
Chief Complaint:   Referring Provider:  Raelyn Number, MD      ASSESSMENT AND PLAN;   #1.  H/O colonic polyps.  #2.  Family history of colonic polyps (brother age 65).  Plan:  - Proceed with colonoscopy with miralax. Discussed risks & benefits. (Risks including rare perforation req laparotomy, bleeding after biopsies/polypectomy req blood transfusion, rare chance of missing neoplasms, risks of anesthesia/sedation). Benefits outweigh the risks. Patient agrees to proceed. All the questions were answered. Consent forms given for review. -Continue rest of the medications.    HPI:    Austin Espinoza is a 63 y.o. male  No nausea, vomiting, heartburn, regurgitation, odynophagia or dysphagia.  No significant diarrhea or constipation.  There is no melena or hematochezia. No unintentional weight loss.  Had some anorexia and decreased appetite with metformin.  Has been able to lose weight from 190pounds to 170 pounds gradually.  Feels significantly better.  Works at [employer].  Very active physically.  Walks a lot.  Does not have any leg claudication or shortness of breath.   Past Medical History:  Diagnosis Date  . Anemia   . CAD (coronary artery disease)    Bare-metal stent, LAD, 2007, total RCA, right to right collaterals  . Dyslipidemia   . Ejection fraction < 50%    EF 40% in the past  . Family history of colonic polyps   . History of blood transfusion 01/29/2015   post op  . Hyperlipemia   . Hypertension   . PAD (peripheral artery disease) ([place])    Arterial leg Doppler, December 29, 2011, greater than 50% focal stenosis of the left mid S.FA, right ABI mild range left ABI moderate range  //   consultation by Dr. Angelena Form, Jan 19, 2012. CTA with severe right CIA stenosis, bil SFA.   . Tobacco abuse   . Type II diabetes mellitus ([place])     Past Surgical History:  Procedure Laterality Date  . ABDOMINAL AORTAGRAM N/A 04/06/2012   Procedure: ABDOMINAL Maxcine Ham;  Surgeon:  Burnell Blanks, MD;  Location: [facility] CATH LAB;  Service: Cardiovascular;  Laterality: N/A;  . ABDOMINAL AORTOGRAM W/LOWER EXTREMITY N/A 12/31/2016   Procedure: Abdominal Aortogram w/Lower Extremity;  Surgeon: Conrad [place], MD;  Location: [place] CV LAB;  Service: Cardiovascular;  Laterality: N/A;  . COLONOSCOPY W/ POLYPECTOMY  11/21/2015   Colonic polyps status post polypectomy. Mild sigmoid diverticulosis  . CORONARY ANGIOPLASTY WITH STENT PLACEMENT     2008, 07/19/2015  . ENDARTERECTOMY FEMORAL Bilateral 01/29/2015   Procedure: ENDARTERECTOMY RIGHT FEMORAL ARTERY AND LEFT FEMORAL ARTERY;  Surgeon: Conrad H[place], MD;  Location: [place];  Service: Vascular;  Laterality: Bilateral;  . FEMORAL-FEMORAL BYPASS GRAFT Bilateral 01/29/2015   Procedure: BYPASS GRAFT LEFT FEMORAL ARTERY TO RIGHT FEMORAL ARTERY USING GORE PROPATEN GRAFT;  Surgeon: Conrad [place], MD;  Location: [place];  Service: Vascular;  Laterality: Bilateral;  . INTRAOPERATIVE ARTERIOGRAM Left 01/29/2015   Procedure: INTRA OPERATIVE ARTERIOGRAM, LEFT ILIAC ARTERY;  Surgeon: Conrad [place], MD;  Location: [place];  Service: Vascular;  Laterality: Left;  . PATCH ANGIOPLASTY Left 01/29/2015   Procedure: LEFT FEMORAL ARTERY PATCH ANGIOPLASTY USING VASCUGUARD PATCH;  Surgeon: Conrad , MD;  Location: [place];  Service: Vascular;  Laterality: Left;  . PERIPHERAL VASCULAR BALLOON ANGIOPLASTY Left 12/31/2016   Procedure: Peripheral Vascular Balloon Angioplasty;  Surgeon: Conrad , MD;  Location: [place] CV LAB;  Service: Cardiovascular;  Laterality: Left;  lt  ext iliac  . PERIPHERAL VASCULAR CATHETERIZATION N/A 01/14/2015   Procedure: Abdominal Aortogram;  Surgeon: Conrad [place], MD;  Location: [place] CV LAB;  Service: Cardiovascular;  Laterality: N/A;  . PERIPHERAL VASCULAR CATHETERIZATION Bilateral 01/14/2015   Procedure: Lower Extremity Angiography;  Surgeon: Conrad Rhe[place], MD;  Location: [place] CV LAB;  Service: Cardiovascular;   Laterality: Bilateral;  . PERIPHERAL VASCULAR CATHETERIZATION N/A 07/18/2015   Procedure: Abdominal Aortogram;  Surgeon: Conrad [place], MD;  Location: [place] CV LAB;  Service: Cardiovascular;  Laterality: N/A;  . THROMBECTOMY FEMORAL ARTERY Left 01/29/2015   Procedure: THROMBECTOMY LEFT FEMORAL ARTERY;  Surgeon: Conrad [place], MD;  Location: [place];  Service: Vascular;  Laterality: Left;    Family History  Problem Relation Age of Onset  . Heart attack Father   . Heart disease Father   . Diabetes Mother   . Throat cancer Brother   . Hypertension Sister     Social History   Tobacco Use  . Smoking status: Light Tobacco Smoker    Packs/day: 0.25    Years: 45.00    Pack years: 11.25    Types: Cigarettes  . Smokeless tobacco: Never Used  . Tobacco comment: 1/2 pk per day  Substance Use Topics  . Alcohol use: No    Alcohol/week: 0.0 standard drinks  . Drug use: No    Current Outpatient Medications  Medication Sig Dispense Refill  . allopurinol (ZYLOPRIM) 300 MG tablet Take 300 mg by mouth every evening.    Marland Kitchen aspirin EC 81 MG tablet Take 81 mg by mouth daily.    . carvedilol (COREG) 6.25 MG tablet Take 6.25 mg by mouth 2 (two) times daily.    . cholecalciferol (VITAMIN D) 400 units TABS tablet Take 400 Units by mouth every evening.    . gabapentin (NEURONTIN) 300 MG capsule Take 600 mg by mouth 3 (three) times daily.     Marland Kitchen lisinopril (PRINIVIL,ZESTRIL) 20 MG tablet TAKE ONE TABLET BY MOUTH ONCE DAILY. NEEDS  APPOINTMENT  FOR  FURTHER  REFILLS. (Patient taking differently: Take 20mg  by mouth once daily) 7 tablet 0  . metFORMIN (GLUCOPHAGE) 500 MG tablet Take 500 mg by mouth 2 (two) times daily with a meal.     . rosuvastatin (CRESTOR) 10 MG tablet Take 10 mg by mouth daily.    Marland Kitchen BEVESPI AEROSPHERE 9-4.8 MCG/ACT AERO Inhale 2 puffs into the lungs at bedtime as needed for shortness of breath.     No current facility-administered medications for this visit.     Allergies   Allergen Reactions  . Ibuprofen Itching and Rash    Itching/rash with Duexis (famotidine and ibuprofen)    Review of Systems:  Constitutional: Denies fever, chills, diaphoresis, appetite change and fatigue.  HEENT: Denies photophobia, eye pain, redness, hearing loss, ear pain, congestion, sore throat, rhinorrhea, sneezing, mouth sores, neck pain, neck stiffness and tinnitus.   Respiratory: Denies SOB, DOE, cough, chest tightness,  and wheezing.   Cardiovascular: Denies chest pain, palpitations and leg swelling.  Genitourinary: Denies dysuria, urgency, frequency, hematuria, flank pain and difficulty urinating.  Musculoskeletal: Denies myalgias, back pain, joint swelling, arthralgias and gait problem.  Skin: No rash.  Neurological: Denies dizziness, seizures, syncope, weakness, light-headedness, numbness and headaches.  Hematological: Denies adenopathy. Easy bruising, personal or family bleeding history  Psychiatric/Behavioral: No anxiety or depression     Physical Exam:    Ht 5\' 8"  (1.727 m)   Wt 170 lb (77.1 kg)  BMI 25.85 kg/m  Filed Weights   01/12/19 1132  Weight: 170 lb (77.1 kg)   Not examined since it was a tele-visit  Data Reviewed: I have personally reviewed following labs and imaging studies  CBC: CBC Latest Ref Rng & Units 12/31/2016 07/18/2015 01/31/2015  WBC 4.0 - 10.5 K/uL - - 14.5(H)  Hemoglobin 13.0 - 17.0 g/dL 11.6(L) 12.6(L) 10.9(L)  Hematocrit 39.0 - 52.0 % 34.0(L) 37.0(L) 33.0(L)  Platelets 150 - 400 K/uL - - 212    CMP: CMP Latest Ref Rng & Units 12/31/2016 07/18/2015 01/30/2015  Glucose 65 - 99 mg/dL 98 94 135(H)  BUN 6 - 20 mg/dL 13 11 12   Creatinine 0.61 - 1.24 mg/dL 1.00 1.00 0.98  Sodium 135 - 145 mmol/L 142 142 137  Potassium 3.5 - 5.1 mmol/L 4.4 3.9 4.2  Chloride 101 - 111 mmol/L 107 106 107  CO2 22 - 32 mmol/L - - 24  Calcium 8.9 - 10.3 mg/dL - - 8.7(L)  Total Protein 6.5 - 8.1 g/dL - - -  Total Bilirubin 0.3 - 1.2 mg/dL - - -  Alkaline Phos  38 - 126 U/L - - -  AST 15 - 41 U/L - - -  ALT 17 - 63 U/L - - -   This service was provided via telemedicine.  The patient was located at home.  The provider was located in office.  The patient did consent to this telephone visit and is aware of possible charges through their insurance for this visit.  The patient was referred by Dr Jannette Fogo.   Time spent on call and coordination of care: 20 min    Carmell Austria, MD 01/12/2019, 2:12 PM  Cc: Raelyn Number, MD

## 2019-01-17 ENCOUNTER — Telehealth: Payer: Self-pay

## 2019-01-17 NOTE — Telephone Encounter (Signed)
Covid-19 travel screening questions  Have you traveled in the last 14 days? No If yes where?  Do you now or have you had a fever in the last 14 days? No  Do you have any respiratory symptoms of shortness of breath or cough now or in the last 14 days? No  Do you have any family members or close contacts with diagnosed or suspected Covid-19? No       

## 2019-01-17 NOTE — Telephone Encounter (Signed)
Left message with brother to call back, patient was asleep

## 2019-01-17 NOTE — Telephone Encounter (Signed)
Pt returned your call and would like a call back again. °

## 2019-01-18 ENCOUNTER — Encounter: Payer: PPO | Admitting: Gastroenterology

## 2019-01-20 ENCOUNTER — Other Ambulatory Visit: Payer: Self-pay

## 2019-01-20 ENCOUNTER — Ambulatory Visit (AMBULATORY_SURGERY_CENTER): Payer: Self-pay

## 2019-01-20 ENCOUNTER — Encounter: Payer: Self-pay | Admitting: Gastroenterology

## 2019-01-20 VITALS — Ht 68.0 in | Wt 168.0 lb

## 2019-01-20 DIAGNOSIS — Z86010 Personal history of colonic polyps: Secondary | ICD-10-CM

## 2019-01-20 NOTE — Progress Notes (Signed)
Denies allergies to eggs or soy products. Denies complication of anesthesia or sedation. Denies use of weight loss medication. Denies use of O2.   Emmi instructions given for colonoscopy.  Pre-Visit was conducted by phone due to Covid 19. Instructions were reviewed and mailed to patients confirmed home address. Patient was encouraged to call if he had any questions or concerns regarding instructions.

## 2019-02-01 ENCOUNTER — Telehealth: Payer: Self-pay | Admitting: *Deleted

## 2019-02-01 NOTE — Telephone Encounter (Signed)

## 2019-02-03 ENCOUNTER — Ambulatory Visit (AMBULATORY_SURGERY_CENTER): Payer: PPO | Admitting: Gastroenterology

## 2019-02-03 ENCOUNTER — Encounter: Payer: Self-pay | Admitting: Gastroenterology

## 2019-02-03 ENCOUNTER — Other Ambulatory Visit: Payer: Self-pay

## 2019-02-03 VITALS — BP 155/85 | HR 63 | Temp 97.5°F | Resp 18 | Ht 70.0 in | Wt 162.0 lb

## 2019-02-03 DIAGNOSIS — Z860100 Personal history of colon polyps, unspecified: Secondary | ICD-10-CM

## 2019-02-03 DIAGNOSIS — D123 Benign neoplasm of transverse colon: Secondary | ICD-10-CM

## 2019-02-03 DIAGNOSIS — Z1211 Encounter for screening for malignant neoplasm of colon: Secondary | ICD-10-CM | POA: Diagnosis not present

## 2019-02-03 MED ORDER — SODIUM CHLORIDE 0.9 % IV SOLN: 500.0000 mL | Freq: Once | INTRAVENOUS | Status: DC

## 2019-02-03 NOTE — Op Note (Signed)
[place] Patient Name: Pierre Dellarocco Procedure Date: 02/03/2019 9:22 AM MRN: [id number] Endoscopist: Jackquline Denmark , MD Age: 63 Referring MD:  Date of Birth: March 11, 1956 Gender: Male Account #: [id number] Procedure:                Colonoscopy Indications:              High risk colon cancer surveillance: Personal                            history of colonic polyps Medicines:                Monitored Anesthesia Care Procedure:                Pre-Anesthesia Assessment:                           - Prior to the procedure, a History and Physical                            was performed, and patient medications and                            allergies were reviewed. The patient's tolerance of                            previous anesthesia was also reviewed. The risks                            and benefits of the procedure and the sedation                            options and risks were discussed with the patient.                            All questions were answered, and informed consent                            was obtained. Prior Anticoagulants: The patient has                            taken no previous anticoagulant or antiplatelet                            agents. ASA Grade Assessment: II - A patient with                            mild systemic disease. After reviewing the risks                            and benefits, the patient was deemed in                            satisfactory condition to undergo the procedure.  After obtaining informed consent, the colonoscope                            was passed under direct vision. Throughout the                            procedure, the patient's blood pressure, pulse, and                            oxygen saturations were monitored continuously. The                            Model CF-HQ190L [id number]) scope was introduced                            through the and advanced to the the  cecum,                            identified by appendiceal orifice and ileocecal                            valve. The colonoscopy was performed without                            difficulty. The patient tolerated the procedure                            well. The quality of the bowel preparation was                            adequate to identify polyps. There was some                            retained stool. Aggressive suctioning and                            aspiration was performed. Overall over 90-95% of                            colonic mucosa was visualized satisfactorily. The                            ileocecal valve, appendiceal orifice, and rectum                            were photographed. Scope In: 9:39:11 AM Scope Out: 9:57:11 AM Scope Withdrawal Time: 0 hours 9 minutes 59 seconds  Total Procedure Duration: 0 hours 18 minutes 0 seconds  Findings:                 Two sessile polyps were found in the mid transverse                            colon and distal transverse colon. The polyps were  4 to 6 mm in size. These polyps were removed with a                            cold biopsy forceps. Resection and retrieval were                            complete. Estimated blood loss: none.                           A few small-mouthed diverticula were found in the                            sigmoid colon.                           Non-bleeding internal hemorrhoids were found during                            retroflexion. The hemorrhoids were moderate.                           The exam was otherwise without abnormality on                            direct and retroflexion views. Complications:            No immediate complications. Estimated Blood Loss:     Estimated blood loss: none. Impression:               -Colonic polyps status post polypectomy.                           -Mild sigmoid diverticulosis.                           -Otherwise  grossly normal colonoscopy. Recommendation:           - Patient has a contact number available for                            emergencies. The signs and symptoms of potential                            delayed complications were discussed with the                            patient. Return to normal activities tomorrow.                            Written discharge instructions were provided to the                            patient.                           - Resume previous diet.                           -  Continue present medications.                           - Await pathology results.                           - Repeat colonoscopy for surveillance based on                            pathology results.                           - Return to GI clinic PRN. Jackquline Denmark, MD 02/03/2019 10:04:21 AM This report has been signed electronically.

## 2019-02-03 NOTE — Progress Notes (Signed)
To PACU, VSS. Report to Rn.tb 

## 2019-02-03 NOTE — Progress Notes (Signed)
cbg recheck at 9:15am 146

## 2019-02-03 NOTE — Patient Instructions (Signed)
YOU HAD AN ENDOSCOPIC PROCEDURE TODAY AT THE [facility]:   Refer to the procedure report that was given to you for any specific questions about what was found during the examination.  If the procedure report does not answer your questions, please call your gastroenterologist to clarify.  If you requested that your care partner not be given the details of your procedure findings, then the procedure report has been included in a sealed envelope for you to review at your convenience later.  YOU SHOULD EXPECT: Some feelings of bloating in the abdomen. Passage of more gas than usual.  Walking can help get rid of the air that was put into your GI tract during the procedure and reduce the bloating. If you had a lower endoscopy (such as a colonoscopy or flexible sigmoidoscopy) you may notice spotting of blood in your stool or on the toilet paper. If you underwent a bowel prep for your procedure, you may not have a normal bowel movement for a few days.  Please Note:  You might notice some irritation and congestion in your nose or some drainage.  This is from the oxygen used during your procedure.  There is no need for concern and it should clear up in a day or so.  SYMPTOMS TO REPORT IMMEDIATELY:   Following lower endoscopy (colonoscopy or flexible sigmoidoscopy):  Excessive amounts of blood in the stool  Significant tenderness or worsening of abdominal pains  Swelling of the abdomen that is new, acute  Fever of 100F or higher   For urgent or emergent issues, a gastroenterologist can be reached at any hour by calling [phone].   DIET:  We do recommend a small meal at first, but then you may proceed to your regular diet.  Drink plenty of fluids but you should avoid alcoholic beverages for 24 hours.  MEDICATIONS: Continue present medications.  Please see handouts given to you by your recovery nurse.  ACTIVITY:  You should plan to take it easy for the rest of today and you should  NOT DRIVE or use heavy machinery until tomorrow (because of the sedation medicines used during the test).    FOLLOW UP: Our staff will call the number listed on your records 48-72 hours following your procedure to check on you and address any questions or concerns that you may have regarding the information given to you following your procedure. If we do not reach you, we will leave a message.  We will attempt to reach you two times.  During this call, we will ask if you have developed any symptoms of COVID 19. If you develop any symptoms (ie: fever, flu-like symptoms, shortness of breath, cough etc.) before then, please call [phone].  If you test positive for Covid 19 in the 2 weeks post procedure, please call and report this information to us.    If any biopsies were taken you will be contacted by phone or by letter within the next 1-3 weeks.  Please call us at [phone] if you have not heard about the biopsies in 3 weeks.   Thank you for allowing us to provide for your healthcare needs today.   SIGNATURES/CONFIDENTIALITY: You and/or your care partner have signed paperwork which will be entered into your electronic medical record.  These signatures attest to the fact that that the information above on your After Visit Summary has been reviewed and is understood.  Full responsibility of the confidentiality of this discharge information lies with you and/or   your care-partner. 

## 2019-02-07 ENCOUNTER — Telehealth: Payer: Self-pay | Admitting: *Deleted

## 2019-02-07 NOTE — Telephone Encounter (Signed)
  Follow up Call-  Call back number 02/03/2019  Post procedure Call Back phone  # [phone]  Permission to leave phone message Yes  Some recent data might be hidden     Patient questions:  Do you have a fever, pain , or abdominal swelling? No. Pain Score  0 *  Have you tolerated food without any problems? Yes.    Have you been able to return to your normal activities? Yes.    Do you have any questions about your discharge instructions: Diet   No. Medications  No. Follow up visit  No.  Do you have questions or concerns about your Care? No.  Actions: * If pain score is 4 or above: No action needed, pain <4.  1. Have you developed a fever since your procedure? NO  2.   Have you had an respiratory symptoms (SOB or cough) since your procedure? NO  3.   Have you tested positive for COVID 19 since your procedure NO  4.   Have you had any family members/close contacts diagnosed with the COVID 19 since your procedure?  NO   If yes to any of these questions please route to Joylene John, RN and Alphonsa Gin, RN.

## 2019-02-08 ENCOUNTER — Encounter: Payer: Self-pay | Admitting: Gastroenterology

## 2019-02-16 DIAGNOSIS — I10 Essential (primary) hypertension: Secondary | ICD-10-CM | POA: Diagnosis not present

## 2019-02-16 DIAGNOSIS — E1165 Type 2 diabetes mellitus with hyperglycemia: Secondary | ICD-10-CM | POA: Diagnosis not present

## 2019-02-16 DIAGNOSIS — I739 Peripheral vascular disease, unspecified: Secondary | ICD-10-CM | POA: Diagnosis not present

## 2019-02-16 DIAGNOSIS — R7301 Impaired fasting glucose: Secondary | ICD-10-CM | POA: Diagnosis not present

## 2019-03-23 DIAGNOSIS — B351 Tinea unguium: Secondary | ICD-10-CM | POA: Diagnosis not present

## 2019-03-23 DIAGNOSIS — M2041 Other hammer toe(s) (acquired), right foot: Secondary | ICD-10-CM | POA: Diagnosis not present

## 2019-03-23 DIAGNOSIS — L84 Corns and callosities: Secondary | ICD-10-CM | POA: Diagnosis not present

## 2019-03-23 DIAGNOSIS — M2011 Hallux valgus (acquired), right foot: Secondary | ICD-10-CM | POA: Diagnosis not present

## 2019-03-23 DIAGNOSIS — E1151 Type 2 diabetes mellitus with diabetic peripheral angiopathy without gangrene: Secondary | ICD-10-CM | POA: Diagnosis not present

## 2019-05-18 DIAGNOSIS — M10072 Idiopathic gout, left ankle and foot: Secondary | ICD-10-CM | POA: Diagnosis not present

## 2019-05-18 DIAGNOSIS — Z7901 Long term (current) use of anticoagulants: Secondary | ICD-10-CM | POA: Diagnosis not present

## 2019-05-18 DIAGNOSIS — G603 Idiopathic progressive neuropathy: Secondary | ICD-10-CM | POA: Diagnosis not present

## 2019-05-18 DIAGNOSIS — E782 Mixed hyperlipidemia: Secondary | ICD-10-CM | POA: Diagnosis not present

## 2019-05-18 DIAGNOSIS — M3500 Sicca syndrome, unspecified: Secondary | ICD-10-CM | POA: Diagnosis not present

## 2019-05-18 DIAGNOSIS — I10 Essential (primary) hypertension: Secondary | ICD-10-CM | POA: Diagnosis not present

## 2019-05-18 DIAGNOSIS — I739 Peripheral vascular disease, unspecified: Secondary | ICD-10-CM | POA: Diagnosis not present

## 2019-05-18 DIAGNOSIS — D518 Other vitamin B12 deficiency anemias: Secondary | ICD-10-CM | POA: Diagnosis not present

## 2019-05-18 DIAGNOSIS — I480 Paroxysmal atrial fibrillation: Secondary | ICD-10-CM | POA: Diagnosis not present

## 2019-05-18 DIAGNOSIS — E119 Type 2 diabetes mellitus without complications: Secondary | ICD-10-CM | POA: Diagnosis not present

## 2019-05-18 DIAGNOSIS — R7301 Impaired fasting glucose: Secondary | ICD-10-CM | POA: Diagnosis not present

## 2019-05-18 DIAGNOSIS — M5442 Lumbago with sciatica, left side: Secondary | ICD-10-CM | POA: Diagnosis not present

## 2019-06-08 DIAGNOSIS — M2011 Hallux valgus (acquired), right foot: Secondary | ICD-10-CM | POA: Diagnosis not present

## 2019-06-08 DIAGNOSIS — B351 Tinea unguium: Secondary | ICD-10-CM | POA: Diagnosis not present

## 2019-06-08 DIAGNOSIS — M2041 Other hammer toe(s) (acquired), right foot: Secondary | ICD-10-CM | POA: Diagnosis not present

## 2019-06-08 DIAGNOSIS — E1151 Type 2 diabetes mellitus with diabetic peripheral angiopathy without gangrene: Secondary | ICD-10-CM | POA: Diagnosis not present

## 2019-06-08 DIAGNOSIS — L84 Corns and callosities: Secondary | ICD-10-CM | POA: Diagnosis not present

## 2019-06-21 DIAGNOSIS — I5022 Chronic systolic (congestive) heart failure: Secondary | ICD-10-CM | POA: Diagnosis not present

## 2019-06-21 DIAGNOSIS — I110 Hypertensive heart disease with heart failure: Secondary | ICD-10-CM | POA: Diagnosis not present

## 2019-06-21 DIAGNOSIS — I252 Old myocardial infarction: Secondary | ICD-10-CM | POA: Diagnosis not present

## 2019-06-21 DIAGNOSIS — E782 Mixed hyperlipidemia: Secondary | ICD-10-CM | POA: Diagnosis not present

## 2019-06-21 DIAGNOSIS — I2510 Atherosclerotic heart disease of native coronary artery without angina pectoris: Secondary | ICD-10-CM | POA: Diagnosis not present

## 2019-06-21 DIAGNOSIS — R9439 Abnormal result of other cardiovascular function study: Secondary | ICD-10-CM | POA: Diagnosis not present

## 2019-06-21 DIAGNOSIS — I480 Paroxysmal atrial fibrillation: Secondary | ICD-10-CM | POA: Diagnosis not present

## 2019-06-21 DIAGNOSIS — Z955 Presence of coronary angioplasty implant and graft: Secondary | ICD-10-CM | POA: Diagnosis not present

## 2019-08-16 DIAGNOSIS — I739 Peripheral vascular disease, unspecified: Secondary | ICD-10-CM | POA: Diagnosis not present

## 2019-08-16 DIAGNOSIS — Z0000 Encounter for general adult medical examination without abnormal findings: Secondary | ICD-10-CM | POA: Diagnosis not present

## 2019-08-16 DIAGNOSIS — I10 Essential (primary) hypertension: Secondary | ICD-10-CM | POA: Diagnosis not present

## 2019-08-16 DIAGNOSIS — E119 Type 2 diabetes mellitus without complications: Secondary | ICD-10-CM | POA: Diagnosis not present

## 2019-08-16 DIAGNOSIS — E038 Other specified hypothyroidism: Secondary | ICD-10-CM | POA: Diagnosis not present

## 2019-08-16 DIAGNOSIS — I480 Paroxysmal atrial fibrillation: Secondary | ICD-10-CM | POA: Diagnosis not present

## 2019-08-16 DIAGNOSIS — R011 Cardiac murmur, unspecified: Secondary | ICD-10-CM | POA: Diagnosis not present

## 2019-08-16 DIAGNOSIS — R7301 Impaired fasting glucose: Secondary | ICD-10-CM | POA: Diagnosis not present

## 2019-08-16 DIAGNOSIS — E559 Vitamin D deficiency, unspecified: Secondary | ICD-10-CM | POA: Diagnosis not present

## 2019-08-16 DIAGNOSIS — R0989 Other specified symptoms and signs involving the circulatory and respiratory systems: Secondary | ICD-10-CM | POA: Diagnosis not present

## 2019-08-16 DIAGNOSIS — D518 Other vitamin B12 deficiency anemias: Secondary | ICD-10-CM | POA: Diagnosis not present

## 2019-08-16 DIAGNOSIS — E782 Mixed hyperlipidemia: Secondary | ICD-10-CM | POA: Diagnosis not present

## 2019-08-18 DIAGNOSIS — R221 Localized swelling, mass and lump, neck: Secondary | ICD-10-CM | POA: Diagnosis not present

## 2019-08-18 DIAGNOSIS — I714 Abdominal aortic aneurysm, without rupture: Secondary | ICD-10-CM | POA: Diagnosis not present

## 2019-08-21 DIAGNOSIS — R590 Localized enlarged lymph nodes: Secondary | ICD-10-CM | POA: Diagnosis not present

## 2019-08-21 DIAGNOSIS — R0989 Other specified symptoms and signs involving the circulatory and respiratory systems: Secondary | ICD-10-CM | POA: Diagnosis not present

## 2019-08-21 DIAGNOSIS — I6523 Occlusion and stenosis of bilateral carotid arteries: Secondary | ICD-10-CM | POA: Diagnosis not present

## 2019-08-21 DIAGNOSIS — D492 Neoplasm of unspecified behavior of bone, soft tissue, and skin: Secondary | ICD-10-CM | POA: Diagnosis not present

## 2019-09-14 DIAGNOSIS — E1151 Type 2 diabetes mellitus with diabetic peripheral angiopathy without gangrene: Secondary | ICD-10-CM | POA: Diagnosis not present

## 2019-09-14 DIAGNOSIS — L84 Corns and callosities: Secondary | ICD-10-CM | POA: Diagnosis not present

## 2019-09-14 DIAGNOSIS — B351 Tinea unguium: Secondary | ICD-10-CM | POA: Diagnosis not present

## 2019-09-14 DIAGNOSIS — M2011 Hallux valgus (acquired), right foot: Secondary | ICD-10-CM | POA: Diagnosis not present

## 2019-09-14 DIAGNOSIS — M2041 Other hammer toe(s) (acquired), right foot: Secondary | ICD-10-CM | POA: Diagnosis not present

## 2019-10-02 DIAGNOSIS — U071 COVID-19: Secondary | ICD-10-CM | POA: Diagnosis not present

## 2019-10-02 DIAGNOSIS — Z20828 Contact with and (suspected) exposure to other viral communicable diseases: Secondary | ICD-10-CM | POA: Diagnosis not present

## 2019-11-09 DIAGNOSIS — I10 Essential (primary) hypertension: Secondary | ICD-10-CM | POA: Diagnosis not present

## 2019-11-09 DIAGNOSIS — D518 Other vitamin B12 deficiency anemias: Secondary | ICD-10-CM | POA: Diagnosis not present

## 2019-11-09 DIAGNOSIS — F17210 Nicotine dependence, cigarettes, uncomplicated: Secondary | ICD-10-CM | POA: Diagnosis not present

## 2019-11-09 DIAGNOSIS — E119 Type 2 diabetes mellitus without complications: Secondary | ICD-10-CM | POA: Diagnosis not present

## 2019-11-09 DIAGNOSIS — E038 Other specified hypothyroidism: Secondary | ICD-10-CM | POA: Diagnosis not present

## 2019-11-09 DIAGNOSIS — E1165 Type 2 diabetes mellitus with hyperglycemia: Secondary | ICD-10-CM | POA: Diagnosis not present

## 2019-11-09 DIAGNOSIS — E559 Vitamin D deficiency, unspecified: Secondary | ICD-10-CM | POA: Diagnosis not present

## 2019-11-09 DIAGNOSIS — E782 Mixed hyperlipidemia: Secondary | ICD-10-CM | POA: Diagnosis not present

## 2019-11-09 DIAGNOSIS — I739 Peripheral vascular disease, unspecified: Secondary | ICD-10-CM | POA: Diagnosis not present

## 2019-11-09 DIAGNOSIS — R7301 Impaired fasting glucose: Secondary | ICD-10-CM | POA: Diagnosis not present

## 2019-12-14 DIAGNOSIS — M19072 Primary osteoarthritis, left ankle and foot: Secondary | ICD-10-CM | POA: Diagnosis not present

## 2019-12-14 DIAGNOSIS — L84 Corns and callosities: Secondary | ICD-10-CM | POA: Diagnosis not present

## 2019-12-14 DIAGNOSIS — B351 Tinea unguium: Secondary | ICD-10-CM | POA: Diagnosis not present

## 2019-12-14 DIAGNOSIS — M2041 Other hammer toe(s) (acquired), right foot: Secondary | ICD-10-CM | POA: Diagnosis not present

## 2019-12-14 DIAGNOSIS — M2011 Hallux valgus (acquired), right foot: Secondary | ICD-10-CM | POA: Diagnosis not present

## 2019-12-14 DIAGNOSIS — I739 Peripheral vascular disease, unspecified: Secondary | ICD-10-CM | POA: Diagnosis not present

## 2019-12-14 DIAGNOSIS — E1151 Type 2 diabetes mellitus with diabetic peripheral angiopathy without gangrene: Secondary | ICD-10-CM | POA: Diagnosis not present

## 2020-01-11 DIAGNOSIS — J101 Influenza due to other identified influenza virus with other respiratory manifestations: Secondary | ICD-10-CM | POA: Diagnosis not present

## 2020-01-11 DIAGNOSIS — B974 Respiratory syncytial virus as the cause of diseases classified elsewhere: Secondary | ICD-10-CM | POA: Diagnosis not present

## 2020-01-11 DIAGNOSIS — U071 COVID-19: Secondary | ICD-10-CM | POA: Diagnosis not present

## 2020-01-11 DIAGNOSIS — Z20828 Contact with and (suspected) exposure to other viral communicable diseases: Secondary | ICD-10-CM | POA: Diagnosis not present

## 2020-01-25 DIAGNOSIS — E1165 Type 2 diabetes mellitus with hyperglycemia: Secondary | ICD-10-CM | POA: Diagnosis not present

## 2020-01-25 DIAGNOSIS — U071 COVID-19: Secondary | ICD-10-CM | POA: Diagnosis not present

## 2020-01-25 DIAGNOSIS — I10 Essential (primary) hypertension: Secondary | ICD-10-CM | POA: Diagnosis not present

## 2020-01-25 DIAGNOSIS — H6123 Impacted cerumen, bilateral: Secondary | ICD-10-CM | POA: Diagnosis not present

## 2020-02-08 DIAGNOSIS — E038 Other specified hypothyroidism: Secondary | ICD-10-CM | POA: Diagnosis not present

## 2020-02-08 DIAGNOSIS — E559 Vitamin D deficiency, unspecified: Secondary | ICD-10-CM | POA: Diagnosis not present

## 2020-02-08 DIAGNOSIS — D518 Other vitamin B12 deficiency anemias: Secondary | ICD-10-CM | POA: Diagnosis not present

## 2020-02-08 DIAGNOSIS — E119 Type 2 diabetes mellitus without complications: Secondary | ICD-10-CM | POA: Diagnosis not present

## 2020-02-08 DIAGNOSIS — Z79899 Other long term (current) drug therapy: Secondary | ICD-10-CM | POA: Diagnosis not present

## 2020-02-08 DIAGNOSIS — E782 Mixed hyperlipidemia: Secondary | ICD-10-CM | POA: Diagnosis not present

## 2020-02-08 DIAGNOSIS — I10 Essential (primary) hypertension: Secondary | ICD-10-CM | POA: Diagnosis not present

## 2020-02-19 DIAGNOSIS — I209 Angina pectoris, unspecified: Secondary | ICD-10-CM | POA: Diagnosis not present

## 2020-02-19 DIAGNOSIS — D518 Other vitamin B12 deficiency anemias: Secondary | ICD-10-CM | POA: Diagnosis not present

## 2020-02-19 DIAGNOSIS — E1165 Type 2 diabetes mellitus with hyperglycemia: Secondary | ICD-10-CM | POA: Diagnosis not present

## 2020-02-19 DIAGNOSIS — I480 Paroxysmal atrial fibrillation: Secondary | ICD-10-CM | POA: Diagnosis not present

## 2020-02-19 DIAGNOSIS — E119 Type 2 diabetes mellitus without complications: Secondary | ICD-10-CM | POA: Diagnosis not present

## 2020-02-19 DIAGNOSIS — J441 Chronic obstructive pulmonary disease with (acute) exacerbation: Secondary | ICD-10-CM | POA: Diagnosis not present

## 2020-02-19 DIAGNOSIS — J449 Chronic obstructive pulmonary disease, unspecified: Secondary | ICD-10-CM | POA: Diagnosis not present

## 2020-02-19 DIAGNOSIS — E782 Mixed hyperlipidemia: Secondary | ICD-10-CM | POA: Diagnosis not present

## 2020-02-19 DIAGNOSIS — I10 Essential (primary) hypertension: Secondary | ICD-10-CM | POA: Diagnosis not present

## 2020-02-19 DIAGNOSIS — N400 Enlarged prostate without lower urinary tract symptoms: Secondary | ICD-10-CM | POA: Diagnosis not present

## 2020-02-19 DIAGNOSIS — I5023 Acute on chronic systolic (congestive) heart failure: Secondary | ICD-10-CM | POA: Diagnosis not present

## 2020-02-19 DIAGNOSIS — N401 Enlarged prostate with lower urinary tract symptoms: Secondary | ICD-10-CM | POA: Diagnosis not present

## 2020-03-28 DIAGNOSIS — E782 Mixed hyperlipidemia: Secondary | ICD-10-CM | POA: Diagnosis not present

## 2020-03-28 DIAGNOSIS — E119 Type 2 diabetes mellitus without complications: Secondary | ICD-10-CM | POA: Diagnosis not present

## 2020-03-28 DIAGNOSIS — I5023 Acute on chronic systolic (congestive) heart failure: Secondary | ICD-10-CM | POA: Diagnosis not present

## 2020-03-28 DIAGNOSIS — D518 Other vitamin B12 deficiency anemias: Secondary | ICD-10-CM | POA: Diagnosis not present

## 2020-03-28 DIAGNOSIS — N401 Enlarged prostate with lower urinary tract symptoms: Secondary | ICD-10-CM | POA: Diagnosis not present

## 2020-03-28 DIAGNOSIS — J441 Chronic obstructive pulmonary disease with (acute) exacerbation: Secondary | ICD-10-CM | POA: Diagnosis not present

## 2020-03-28 DIAGNOSIS — I10 Essential (primary) hypertension: Secondary | ICD-10-CM | POA: Diagnosis not present

## 2020-03-28 DIAGNOSIS — I480 Paroxysmal atrial fibrillation: Secondary | ICD-10-CM | POA: Diagnosis not present

## 2020-03-28 DIAGNOSIS — E1165 Type 2 diabetes mellitus with hyperglycemia: Secondary | ICD-10-CM | POA: Diagnosis not present

## 2020-03-28 DIAGNOSIS — J449 Chronic obstructive pulmonary disease, unspecified: Secondary | ICD-10-CM | POA: Diagnosis not present

## 2020-03-28 DIAGNOSIS — I209 Angina pectoris, unspecified: Secondary | ICD-10-CM | POA: Diagnosis not present

## 2020-03-28 DIAGNOSIS — N400 Enlarged prostate without lower urinary tract symptoms: Secondary | ICD-10-CM | POA: Diagnosis not present

## 2020-04-29 DIAGNOSIS — I480 Paroxysmal atrial fibrillation: Secondary | ICD-10-CM | POA: Diagnosis not present

## 2020-04-29 DIAGNOSIS — E782 Mixed hyperlipidemia: Secondary | ICD-10-CM | POA: Diagnosis not present

## 2020-04-29 DIAGNOSIS — I209 Angina pectoris, unspecified: Secondary | ICD-10-CM | POA: Diagnosis not present

## 2020-04-29 DIAGNOSIS — N400 Enlarged prostate without lower urinary tract symptoms: Secondary | ICD-10-CM | POA: Diagnosis not present

## 2020-04-29 DIAGNOSIS — E119 Type 2 diabetes mellitus without complications: Secondary | ICD-10-CM | POA: Diagnosis not present

## 2020-04-29 DIAGNOSIS — J449 Chronic obstructive pulmonary disease, unspecified: Secondary | ICD-10-CM | POA: Diagnosis not present

## 2020-04-29 DIAGNOSIS — I10 Essential (primary) hypertension: Secondary | ICD-10-CM | POA: Diagnosis not present

## 2020-04-29 DIAGNOSIS — N401 Enlarged prostate with lower urinary tract symptoms: Secondary | ICD-10-CM | POA: Diagnosis not present

## 2020-04-29 DIAGNOSIS — D518 Other vitamin B12 deficiency anemias: Secondary | ICD-10-CM | POA: Diagnosis not present

## 2020-04-29 DIAGNOSIS — E1165 Type 2 diabetes mellitus with hyperglycemia: Secondary | ICD-10-CM | POA: Diagnosis not present

## 2020-04-29 DIAGNOSIS — J441 Chronic obstructive pulmonary disease with (acute) exacerbation: Secondary | ICD-10-CM | POA: Diagnosis not present

## 2020-04-29 DIAGNOSIS — I5023 Acute on chronic systolic (congestive) heart failure: Secondary | ICD-10-CM | POA: Diagnosis not present

## 2020-04-30 DIAGNOSIS — J101 Influenza due to other identified influenza virus with other respiratory manifestations: Secondary | ICD-10-CM | POA: Diagnosis not present

## 2020-04-30 DIAGNOSIS — B974 Respiratory syncytial virus as the cause of diseases classified elsewhere: Secondary | ICD-10-CM | POA: Diagnosis not present

## 2020-04-30 DIAGNOSIS — J018 Other acute sinusitis: Secondary | ICD-10-CM | POA: Diagnosis not present

## 2020-04-30 DIAGNOSIS — U071 COVID-19: Secondary | ICD-10-CM | POA: Diagnosis not present

## 2020-04-30 DIAGNOSIS — Z20828 Contact with and (suspected) exposure to other viral communicable diseases: Secondary | ICD-10-CM | POA: Diagnosis not present

## 2020-05-09 DIAGNOSIS — E782 Mixed hyperlipidemia: Secondary | ICD-10-CM | POA: Diagnosis not present

## 2020-05-09 DIAGNOSIS — Z79899 Other long term (current) drug therapy: Secondary | ICD-10-CM | POA: Diagnosis not present

## 2020-05-09 DIAGNOSIS — I10 Essential (primary) hypertension: Secondary | ICD-10-CM | POA: Diagnosis not present

## 2020-05-09 DIAGNOSIS — J441 Chronic obstructive pulmonary disease with (acute) exacerbation: Secondary | ICD-10-CM | POA: Diagnosis not present

## 2020-05-09 DIAGNOSIS — E559 Vitamin D deficiency, unspecified: Secondary | ICD-10-CM | POA: Diagnosis not present

## 2020-05-09 DIAGNOSIS — E038 Other specified hypothyroidism: Secondary | ICD-10-CM | POA: Diagnosis not present

## 2020-05-09 DIAGNOSIS — J018 Other acute sinusitis: Secondary | ICD-10-CM | POA: Diagnosis not present

## 2020-05-09 DIAGNOSIS — D518 Other vitamin B12 deficiency anemias: Secondary | ICD-10-CM | POA: Diagnosis not present

## 2020-05-09 DIAGNOSIS — M10072 Idiopathic gout, left ankle and foot: Secondary | ICD-10-CM | POA: Diagnosis not present

## 2020-05-09 DIAGNOSIS — E119 Type 2 diabetes mellitus without complications: Secondary | ICD-10-CM | POA: Diagnosis not present

## 2020-05-23 DIAGNOSIS — J441 Chronic obstructive pulmonary disease with (acute) exacerbation: Secondary | ICD-10-CM | POA: Diagnosis not present

## 2020-05-23 DIAGNOSIS — E1165 Type 2 diabetes mellitus with hyperglycemia: Secondary | ICD-10-CM | POA: Diagnosis not present

## 2020-05-23 DIAGNOSIS — I5023 Acute on chronic systolic (congestive) heart failure: Secondary | ICD-10-CM | POA: Diagnosis not present

## 2020-05-23 DIAGNOSIS — N400 Enlarged prostate without lower urinary tract symptoms: Secondary | ICD-10-CM | POA: Diagnosis not present

## 2020-05-23 DIAGNOSIS — I10 Essential (primary) hypertension: Secondary | ICD-10-CM | POA: Diagnosis not present

## 2020-05-23 DIAGNOSIS — N401 Enlarged prostate with lower urinary tract symptoms: Secondary | ICD-10-CM | POA: Diagnosis not present

## 2020-05-23 DIAGNOSIS — J449 Chronic obstructive pulmonary disease, unspecified: Secondary | ICD-10-CM | POA: Diagnosis not present

## 2020-05-23 DIAGNOSIS — I480 Paroxysmal atrial fibrillation: Secondary | ICD-10-CM | POA: Diagnosis not present

## 2020-05-23 DIAGNOSIS — E782 Mixed hyperlipidemia: Secondary | ICD-10-CM | POA: Diagnosis not present

## 2020-05-23 DIAGNOSIS — D518 Other vitamin B12 deficiency anemias: Secondary | ICD-10-CM | POA: Diagnosis not present

## 2020-05-23 DIAGNOSIS — E119 Type 2 diabetes mellitus without complications: Secondary | ICD-10-CM | POA: Diagnosis not present

## 2020-05-23 DIAGNOSIS — I209 Angina pectoris, unspecified: Secondary | ICD-10-CM | POA: Diagnosis not present

## 2020-06-20 DIAGNOSIS — N400 Enlarged prostate without lower urinary tract symptoms: Secondary | ICD-10-CM | POA: Diagnosis not present

## 2020-06-20 DIAGNOSIS — J441 Chronic obstructive pulmonary disease with (acute) exacerbation: Secondary | ICD-10-CM | POA: Diagnosis not present

## 2020-06-20 DIAGNOSIS — D518 Other vitamin B12 deficiency anemias: Secondary | ICD-10-CM | POA: Diagnosis not present

## 2020-06-20 DIAGNOSIS — I209 Angina pectoris, unspecified: Secondary | ICD-10-CM | POA: Diagnosis not present

## 2020-06-20 DIAGNOSIS — J449 Chronic obstructive pulmonary disease, unspecified: Secondary | ICD-10-CM | POA: Diagnosis not present

## 2020-06-20 DIAGNOSIS — I480 Paroxysmal atrial fibrillation: Secondary | ICD-10-CM | POA: Diagnosis not present

## 2020-06-20 DIAGNOSIS — E782 Mixed hyperlipidemia: Secondary | ICD-10-CM | POA: Diagnosis not present

## 2020-06-20 DIAGNOSIS — E1165 Type 2 diabetes mellitus with hyperglycemia: Secondary | ICD-10-CM | POA: Diagnosis not present

## 2020-06-20 DIAGNOSIS — I10 Essential (primary) hypertension: Secondary | ICD-10-CM | POA: Diagnosis not present

## 2020-06-20 DIAGNOSIS — I5023 Acute on chronic systolic (congestive) heart failure: Secondary | ICD-10-CM | POA: Diagnosis not present

## 2020-06-20 DIAGNOSIS — E119 Type 2 diabetes mellitus without complications: Secondary | ICD-10-CM | POA: Diagnosis not present

## 2020-06-20 DIAGNOSIS — N401 Enlarged prostate with lower urinary tract symptoms: Secondary | ICD-10-CM | POA: Diagnosis not present

## 2020-07-24 DIAGNOSIS — E1165 Type 2 diabetes mellitus with hyperglycemia: Secondary | ICD-10-CM | POA: Diagnosis not present

## 2020-07-24 DIAGNOSIS — N401 Enlarged prostate with lower urinary tract symptoms: Secondary | ICD-10-CM | POA: Diagnosis not present

## 2020-07-24 DIAGNOSIS — J441 Chronic obstructive pulmonary disease with (acute) exacerbation: Secondary | ICD-10-CM | POA: Diagnosis not present

## 2020-07-24 DIAGNOSIS — E782 Mixed hyperlipidemia: Secondary | ICD-10-CM | POA: Diagnosis not present

## 2020-07-24 DIAGNOSIS — I209 Angina pectoris, unspecified: Secondary | ICD-10-CM | POA: Diagnosis not present

## 2020-07-24 DIAGNOSIS — J449 Chronic obstructive pulmonary disease, unspecified: Secondary | ICD-10-CM | POA: Diagnosis not present

## 2020-07-24 DIAGNOSIS — I480 Paroxysmal atrial fibrillation: Secondary | ICD-10-CM | POA: Diagnosis not present

## 2020-07-24 DIAGNOSIS — I10 Essential (primary) hypertension: Secondary | ICD-10-CM | POA: Diagnosis not present

## 2020-07-24 DIAGNOSIS — N400 Enlarged prostate without lower urinary tract symptoms: Secondary | ICD-10-CM | POA: Diagnosis not present

## 2020-07-24 DIAGNOSIS — I5023 Acute on chronic systolic (congestive) heart failure: Secondary | ICD-10-CM | POA: Diagnosis not present

## 2020-07-24 DIAGNOSIS — E119 Type 2 diabetes mellitus without complications: Secondary | ICD-10-CM | POA: Diagnosis not present

## 2020-07-24 DIAGNOSIS — D518 Other vitamin B12 deficiency anemias: Secondary | ICD-10-CM | POA: Diagnosis not present

## 2020-08-08 DIAGNOSIS — Z0000 Encounter for general adult medical examination without abnormal findings: Secondary | ICD-10-CM | POA: Diagnosis not present

## 2020-08-08 DIAGNOSIS — E11311 Type 2 diabetes mellitus with unspecified diabetic retinopathy with macular edema: Secondary | ICD-10-CM | POA: Diagnosis not present

## 2020-08-08 DIAGNOSIS — R7301 Impaired fasting glucose: Secondary | ICD-10-CM | POA: Diagnosis not present

## 2020-08-08 DIAGNOSIS — E782 Mixed hyperlipidemia: Secondary | ICD-10-CM | POA: Diagnosis not present

## 2020-08-08 DIAGNOSIS — I10 Essential (primary) hypertension: Secondary | ICD-10-CM | POA: Diagnosis not present

## 2020-08-08 DIAGNOSIS — I480 Paroxysmal atrial fibrillation: Secondary | ICD-10-CM | POA: Diagnosis not present

## 2020-08-08 DIAGNOSIS — M5442 Lumbago with sciatica, left side: Secondary | ICD-10-CM | POA: Diagnosis not present

## 2020-08-08 DIAGNOSIS — I739 Peripheral vascular disease, unspecified: Secondary | ICD-10-CM | POA: Diagnosis not present

## 2020-08-08 DIAGNOSIS — I110 Hypertensive heart disease with heart failure: Secondary | ICD-10-CM | POA: Diagnosis not present

## 2020-08-08 DIAGNOSIS — M10072 Idiopathic gout, left ankle and foot: Secondary | ICD-10-CM | POA: Diagnosis not present

## 2020-08-08 DIAGNOSIS — M3500 Sicca syndrome, unspecified: Secondary | ICD-10-CM | POA: Diagnosis not present

## 2020-08-08 DIAGNOSIS — E119 Type 2 diabetes mellitus without complications: Secondary | ICD-10-CM | POA: Diagnosis not present

## 2020-08-15 DIAGNOSIS — E119 Type 2 diabetes mellitus without complications: Secondary | ICD-10-CM | POA: Diagnosis not present

## 2020-08-15 DIAGNOSIS — I5023 Acute on chronic systolic (congestive) heart failure: Secondary | ICD-10-CM | POA: Diagnosis not present

## 2020-08-15 DIAGNOSIS — E782 Mixed hyperlipidemia: Secondary | ICD-10-CM | POA: Diagnosis not present

## 2020-08-15 DIAGNOSIS — J449 Chronic obstructive pulmonary disease, unspecified: Secondary | ICD-10-CM | POA: Diagnosis not present

## 2020-08-15 DIAGNOSIS — I480 Paroxysmal atrial fibrillation: Secondary | ICD-10-CM | POA: Diagnosis not present

## 2020-08-15 DIAGNOSIS — N400 Enlarged prostate without lower urinary tract symptoms: Secondary | ICD-10-CM | POA: Diagnosis not present

## 2020-08-15 DIAGNOSIS — I10 Essential (primary) hypertension: Secondary | ICD-10-CM | POA: Diagnosis not present

## 2020-08-15 DIAGNOSIS — E1165 Type 2 diabetes mellitus with hyperglycemia: Secondary | ICD-10-CM | POA: Diagnosis not present

## 2020-08-15 DIAGNOSIS — J441 Chronic obstructive pulmonary disease with (acute) exacerbation: Secondary | ICD-10-CM | POA: Diagnosis not present

## 2020-08-15 DIAGNOSIS — D518 Other vitamin B12 deficiency anemias: Secondary | ICD-10-CM | POA: Diagnosis not present

## 2020-08-15 DIAGNOSIS — I209 Angina pectoris, unspecified: Secondary | ICD-10-CM | POA: Diagnosis not present

## 2020-08-15 DIAGNOSIS — N401 Enlarged prostate with lower urinary tract symptoms: Secondary | ICD-10-CM | POA: Diagnosis not present

## 2021-03-20 ENCOUNTER — Emergency Department (HOSPITAL_COMMUNITY)
Admission: EM | Admit: 2021-03-20 | Discharge: 2021-03-20 | Disposition: A | Discharge status: Home / Self Care | Payer: Medicare Other | Attending: Emergency Medicine | Admitting: Emergency Medicine

## 2021-03-20 ENCOUNTER — Emergency Department (HOSPITAL_COMMUNITY): Payer: Medicare Other

## 2021-03-20 DIAGNOSIS — Z5321 Procedure and treatment not carried out due to patient leaving prior to being seen by health care provider: Secondary | ICD-10-CM | POA: Diagnosis not present

## 2021-03-20 DIAGNOSIS — R0789 Other chest pain: Secondary | ICD-10-CM | POA: Insufficient documentation

## 2021-03-20 DIAGNOSIS — M79602 Pain in left arm: Secondary | ICD-10-CM | POA: Diagnosis not present

## 2021-03-20 DIAGNOSIS — M79601 Pain in right arm: Secondary | ICD-10-CM | POA: Diagnosis not present

## 2021-03-20 LAB — BASIC METABOLIC PANEL WITH GFR
Anion gap: 10 (ref 5–15)
BUN: 7 mg/dL — ABNORMAL LOW (ref 8–23)
CO2: 22 mmol/L (ref 22–32)
Calcium: 10 mg/dL (ref 8.9–10.3)
Chloride: 105 mmol/L (ref 98–111)
Creatinine, Ser: 1.22 mg/dL (ref 0.61–1.24)
GFR, Estimated: >60 mL/min
Glucose, Bld: 125 mg/dL — ABNORMAL HIGH (ref 70–99)
Potassium: 4.1 mmol/L (ref 3.5–5.1)
Sodium: 137 mmol/L (ref 135–145)

## 2021-03-20 LAB — CBC
HCT: 34.2 % — ABNORMAL LOW (ref 39.0–52.0)
Hemoglobin: 11.1 g/dL — ABNORMAL LOW (ref 13.0–17.0)
MCH: 33 pg (ref 26.0–34.0)
MCHC: 32.5 g/dL (ref 30.0–36.0)
MCV: 101.8 fL — ABNORMAL HIGH (ref 80.0–100.0)
Platelets: 212 10*3/uL (ref 150–400)
RBC: 3.36 MIL/uL — ABNORMAL LOW (ref 4.22–5.81)
RDW: 13.7 % (ref 11.5–15.5)
WBC: 5 10*3/uL (ref 4.0–10.5)
nRBC: 0 % (ref 0.0–0.2)

## 2021-03-20 LAB — TROPONIN I (HIGH SENSITIVITY): Troponin I (High Sensitivity): 45 ng/L — ABNORMAL HIGH

## 2021-03-20 NOTE — ED Provider Notes (Signed)
Emergency Medicine Provider Triage Evaluation Note  Austin Espinoza , a 65 y.o. male  was evaluated in triage.  Pt complains of presents with chest pressure does not radiate, became slightly diaphoretic, no shortness of breath, nausea or vomiting.  States she felt better after he took a nitroglycerin, this happened around noon today.  He has no significant cardiac history, he denies orthopnea or lower leg swelling.  Denies illicit drug use.-.  Review of Systems  Positive: Chest pressure, chest pain Negative: Shortness of breath, nausea  Physical Exam  BP (!) 174/80   Pulse 82   Temp 98.3 F (36.8 C) (Oral)   Resp 16   SpO2 100%  Gen:   Awake, no distress   Resp:  Normal effort  MSK:   Moves extremities without difficulty  Other:    Medical Decision Making  Medically screening exam initiated at 2:49 PM.  Appropriate orders placed.  Austin Espinoza was informed that the remainder of the evaluation will be completed by another provider, this initial triage assessment does not replace that evaluation, and the importance of remaining in the ED until their evaluation is complete.  Presents with chest pain, lab work and imaging have been ordered, patient will need further work-up.   Austin Fennel, PA-C 03/20/21 1450    Tegeler, Gwenyth Allegra, MD 03/20/21 [phone]

## 2021-03-20 NOTE — ED Notes (Signed)
Patient left without being seen.

## 2021-03-20 NOTE — ED Triage Notes (Signed)
Pt arrives via [facility] with c/o chest pain. Pt reports heaviness/pressure that radiated to both arms. Pt took 1 nitro with relief. EMS gave 324 ASA Pt denies pain in triage.   146/70 CBG 177 HR 70 99% RA

## 2023-07-19 ENCOUNTER — Telehealth: Payer: Self-pay

## 2023-07-19 NOTE — Telephone Encounter (Signed)
Message received on triage line from [place] at [facility] to make an appt for this pt. He has "lower extremity pain" and has not been here since 2020. He was due for six month f/u at the end of 2020. I have returned her call and LVM asking that she fax over a referral for this pt, since it has been greater than 3 years.

## 2023-09-24 ENCOUNTER — Other Ambulatory Visit: Payer: Self-pay | Admitting: *Deleted

## 2023-09-24 DIAGNOSIS — I739 Peripheral vascular disease, unspecified: Secondary | ICD-10-CM

## 2023-09-24 DIAGNOSIS — I70229 Atherosclerosis of native arteries of extremities with rest pain, unspecified extremity: Secondary | ICD-10-CM

## 2023-10-01 ENCOUNTER — Encounter (HOSPITAL_COMMUNITY): Payer: Medicare Other

## 2023-10-01 ENCOUNTER — Encounter: Payer: Medicare Other | Admitting: Vascular Surgery

## 2023-10-06 NOTE — Progress Notes (Signed)
 Patient ID: Austin Espinoza, male   DOB: 1956/05/08, 68 y.o.   MRN: [id number]  Reason for Consult: New Patient (Initial Visit)   Referred by Zachary Lamar BRAVO, NP  Subjective:     HPI  Austin Espinoza is a 68 y.o. male presenting for follow-up of PAD.  He has a significant surgical history but has not been seen in our office since 2020.  Ultimately he has an occlusion of the right external iliac and is on his second left to right femoral-femoral bypass and has most recently underwent DCB angioplasty of the left iliac system in 2018.  Today he reports some leg fatigue when walking but denies significant pain.  He denies rest pain or nonhealing wounds.  He did have a recent mechanical fall when he slipped on some ice, he did not go to the hospital but has had some pain on the left side of his torso for the last week or 2.  Past Medical History:  Diagnosis Date   Allergy    Anemia    Anxiety    CAD (coronary artery disease)    Bare-metal stent, LAD, 2007, total RCA, right to right collaterals   Dyslipidemia    Ejection fraction < 50%    EF 40% in the past   Family history of colonic polyps    History of blood transfusion 01/29/2015   post op   Hyperlipemia    Hypertension    Myocardial infarction [facility])    PAD (peripheral artery disease) (HCC)    Arterial leg Doppler, December 29, 2011, greater than 50% focal stenosis of the left mid S.FA, right ABI mild range left ABI moderate range  //   consultation by Dr. Verlin, Jan 19, 2012. CTA with severe right CIA stenosis, bil SFA.    Tobacco abuse    Type II diabetes mellitus (HCC)    Family History  Problem Relation Age of Onset   Heart attack Father    Heart disease Father    Diabetes Mother    Throat cancer Brother    Hypertension Sister    Colon cancer Neg Hx    Esophageal cancer Neg Hx    Rectal cancer Neg Hx    Stomach cancer Neg Hx    Past Surgical History:  Procedure Laterality Date   ABDOMINAL AORTAGRAM N/A 04/06/2012    Procedure: ABDOMINAL EZELLA;  Surgeon: Lonni JONETTA Verlin, MD;  Location: [facility] CATH LAB;  Service: Cardiovascular;  Laterality: N/A;   ABDOMINAL AORTOGRAM W/LOWER EXTREMITY N/A 12/31/2016   Procedure: Abdominal Aortogram w/Lower Extremity;  Surgeon: Redell LITTIE Door, MD;  Location: [facility] INVASIVE CV LAB;  Service: Cardiovascular;  Laterality: N/A;   COLONOSCOPY W/ POLYPECTOMY  11/21/2015   Colonic polyps status post polypectomy. Mild sigmoid diverticulosis   CORONARY ANGIOPLASTY WITH STENT PLACEMENT     2008, 07/19/2015   ENDARTERECTOMY FEMORAL Bilateral 01/29/2015   Procedure: ENDARTERECTOMY RIGHT FEMORAL ARTERY AND LEFT FEMORAL ARTERY;  Surgeon: Redell LITTIE Door, MD;  Location: [facility] OR;  Service: Vascular;  Laterality: Bilateral;   FEMORAL-FEMORAL BYPASS GRAFT Bilateral 01/29/2015   Procedure: BYPASS GRAFT LEFT FEMORAL ARTERY TO RIGHT FEMORAL ARTERY USING GORE PROPATEN GRAFT;  Surgeon: Redell LITTIE Door, MD;  Location: [facility] OR;  Service: Vascular;  Laterality: Bilateral;   INTRAOPERATIVE ARTERIOGRAM Left 01/29/2015   Procedure: INTRA OPERATIVE ARTERIOGRAM, LEFT ILIAC ARTERY;  Surgeon: Redell LITTIE Door, MD;  Location: [facility] OR;  Service: Vascular;  Laterality: Left;   PATCH ANGIOPLASTY Left 01/29/2015   Procedure: LEFT  FEMORAL ARTERY PATCH ANGIOPLASTY USING VASCUGUARD PATCH;  Surgeon: Redell LITTIE Door, MD;  Location: [facility] OR;  Service: Vascular;  Laterality: Left;   PERIPHERAL VASCULAR BALLOON ANGIOPLASTY Left 12/31/2016   Procedure: Peripheral Vascular Balloon Angioplasty;  Surgeon: Redell LITTIE Door, MD;  Location: [facility] INVASIVE CV LAB;  Service: Cardiovascular;  Laterality: Left;  lt ext iliac   PERIPHERAL VASCULAR CATHETERIZATION N/A 01/14/2015   Procedure: Abdominal Aortogram;  Surgeon: Redell LITTIE Door, MD;  Location: [facility] INVASIVE CV LAB;  Service: Cardiovascular;  Laterality: N/A;   PERIPHERAL VASCULAR CATHETERIZATION Bilateral 01/14/2015   Procedure: Lower Extremity Angiography;  Surgeon: Redell LITTIE Door, MD;  Location: [facility] INVASIVE CV LAB;   Service: Cardiovascular;  Laterality: Bilateral;   PERIPHERAL VASCULAR CATHETERIZATION N/A 07/18/2015   Procedure: Abdominal Aortogram;  Surgeon: Redell LITTIE Door, MD;  Location: [facility] INVASIVE CV LAB;  Service: Cardiovascular;  Laterality: N/A;   THROMBECTOMY FEMORAL ARTERY Left 01/29/2015   Procedure: THROMBECTOMY LEFT FEMORAL ARTERY;  Surgeon: Redell LITTIE Door, MD;  Location: [facility] OR;  Service: Vascular;  Laterality: Left;    Short Social History:  Social History   Tobacco Use   Smoking status: Light Smoker    Current packs/day: 0.25    Average packs/day: 0.3 packs/day for 45.0 years (11.3 ttl pk-yrs)    Types: Cigarettes   Smokeless tobacco: Never   Tobacco comments:    1/2 pk per day  Substance Use Topics   Alcohol use: No    Alcohol/week: 0.0 standard drinks of alcohol    Allergies  Allergen Reactions   Lisinopril  Swelling   Ibuprofen Itching and Rash    Itching/rash with Duexis (famotidine and ibuprofen)    Current Outpatient Medications  Medication Sig Dispense Refill   aspirin  EC 81 MG tablet Take 81 mg by mouth daily.     carvedilol  (COREG ) 6.25 MG tablet Take 6.25 mg by mouth 2 (two) times daily.     cholecalciferol (VITAMIN D) 400 units TABS tablet Take 400 Units by mouth every evening.     gabapentin  (NEURONTIN ) 300 MG capsule Take 600 mg by mouth 3 (three) times daily.      metFORMIN (GLUCOPHAGE) 500 MG tablet Take 500 mg by mouth 2 (two) times daily with a meal.      rosuvastatin  (CRESTOR ) 10 MG tablet Take 10 mg by mouth daily.     Current Facility-Administered Medications  Medication Dose Route Frequency Provider Last Rate Last Admin   0.9 %  sodium chloride  infusion  500 mL Intravenous Once Charlanne Groom, MD        REVIEW OF SYSTEMS   All other systems were reviewed and are negative     Objective:  Objective   Vitals:   10/08/23 0916  BP: (!) 142/81  Pulse: 81  Resp: 20  Temp: 97.9 F (36.6 C)  SpO2: 96%  Height: 5' 10 (1.778 m)   Body mass index is  23.24 kg/m.  Physical Exam General: no acute distress Cardiac: hemodynamically stable Pulm: normal work of breathing Neuro: alert, no focal deficit Extremities: no edema, dry, hairless legs, no wounds   Data: ABI +---------+------------------+-----+----------+--------+  Right   Rt Pressure (mmHg)IndexWaveform  Comment   +---------+------------------+-----+----------+--------+  Brachial 146                                        +---------+------------------+-----+----------+--------+  PTA     136  0.93 monophasic          +---------+------------------+-----+----------+--------+  DP      111               0.76 monophasic          +---------+------------------+-----+----------+--------+  Great Toe78                0.53                     +---------+------------------+-----+----------+--------+   +---------+------------------+-----+----------+-------+  Left    Lt Pressure (mmHg)IndexWaveform  Comment  +---------+------------------+-----+----------+-------+  Brachial 147                                       +---------+------------------+-----+----------+-------+  PTA     117               0.80 monophasic         +---------+------------------+-----+----------+-------+  DP      150               1.02 monophasic         +---------+------------------+-----+----------+-------+  Great Toe85                0.58                    +---------+------------------+-----+----------+-------+    Femorofemoral bypass duplex +--------------------+--------+--------+----------+--------+                     PSV cm/sStenosisWaveform  Comments  +--------------------+--------+--------+----------+--------+  Inflow             127             biphasic            +--------------------+--------+--------+----------+--------+  Proximal Anastomosis106             biphasic             +--------------------+--------+--------+----------+--------+  Proximal Graft      112             biphasic            +--------------------+--------+--------+----------+--------+  Mid Graft           78              monophasic          +--------------------+--------+--------+----------+--------+  Distal Graft        100             monophasic          +--------------------+--------+--------+----------+--------+  Distal Anastomosis  88              monophasic          +--------------------+--------+--------+----------+--------+  Outflow            72              monophasic          +--------------------+--------+--------+----------+--------+         Assessment/Plan:     Austin Espinoza is a 68 y.o. male with severe PAD who has underwent multiple surgical interventions, bilateral iliofemoral endarterectomies, femoral-femoral bypass x 2 as well as DCB of the left iliac system. The studies today are stable from when they were last evaluated in 2020 with an ABI greater than 0.9 bilaterally and a patent femorofemoral bypass with stable velocities.  Plan for follow-up in  1 year with repeat ABI and femorofemoral bypass to    Recommendations to optimize cardiovascular risk: Abstinence from all tobacco products. Blood glucose control with goal A1c < 7%. Blood pressure control with goal blood pressure < 140/90 mmHg. Lipid reduction therapy with goal LDL-C <100 mg/dL  Aspirin  81mg  PO QD.  Atorvastatin 40-80mg  PO QD (or other high intensity statin therapy).     Austin Espinoza Serve MD [facility]

## 2023-10-08 ENCOUNTER — Encounter: Payer: Self-pay | Admitting: Vascular Surgery

## 2023-10-08 ENCOUNTER — Ambulatory Visit (HOSPITAL_COMMUNITY)
Admission: RE | Admit: 2023-10-08 | Discharge: 2023-10-08 | Disposition: A | Discharge status: Home / Self Care | Payer: 59 | Source: Ambulatory Visit | Attending: Vascular Surgery

## 2023-10-08 ENCOUNTER — Ambulatory Visit (INDEPENDENT_AMBULATORY_CARE_PROVIDER_SITE_OTHER)
Admission: RE | Admit: 2023-10-08 | Discharge: 2023-10-08 | Disposition: A | Discharge status: Home / Self Care | Payer: 59 | Source: Ambulatory Visit | Attending: Vascular Surgery | Admitting: Vascular Surgery

## 2023-10-08 ENCOUNTER — Ambulatory Visit (INDEPENDENT_AMBULATORY_CARE_PROVIDER_SITE_OTHER): Payer: 59 | Admitting: Vascular Surgery

## 2023-10-08 VITALS — BP 142/81 | HR 81 | Temp 97.9°F | Resp 20 | Ht 70.0 in

## 2023-10-08 DIAGNOSIS — I70229 Atherosclerosis of native arteries of extremities with rest pain, unspecified extremity: Secondary | ICD-10-CM

## 2023-10-08 DIAGNOSIS — I739 Peripheral vascular disease, unspecified: Secondary | ICD-10-CM

## 2023-10-08 DIAGNOSIS — I70221 Atherosclerosis of native arteries of extremities with rest pain, right leg: Secondary | ICD-10-CM | POA: Diagnosis not present

## 2023-10-08 LAB — VAS US ABI WITH/WO TBI
Left ABI: 1.02
Right ABI: 0.93

## 2023-10-23 DIAGNOSIS — I361 Nonrheumatic tricuspid (valve) insufficiency: Secondary | ICD-10-CM | POA: Diagnosis not present

## 2023-10-24 DIAGNOSIS — I440 Atrioventricular block, first degree: Secondary | ICD-10-CM | POA: Diagnosis not present

## 2023-10-25 ENCOUNTER — Encounter (HOSPITAL_COMMUNITY): Payer: Self-pay | Admitting: Internal Medicine

## 2023-10-25 ENCOUNTER — Encounter (HOSPITAL_COMMUNITY)
Admission: AD | Disposition: A | Discharge status: Home Health | Payer: Self-pay | Source: Other Acute Inpatient Hospital | Attending: Cardiology

## 2023-10-25 ENCOUNTER — Inpatient Hospital Stay (HOSPITAL_COMMUNITY)
Admission: AD | Admit: 2023-10-25 | Discharge: 2023-10-28 | DRG: 243 | Disposition: A | Discharge status: Home Health | Payer: 59 | Source: Other Acute Inpatient Hospital | Attending: Cardiology | Admitting: Cardiology

## 2023-10-25 ENCOUNTER — Encounter (HOSPITAL_COMMUNITY): Payer: Self-pay

## 2023-10-25 DIAGNOSIS — F17210 Nicotine dependence, cigarettes, uncomplicated: Secondary | ICD-10-CM | POA: Diagnosis present

## 2023-10-25 DIAGNOSIS — I440 Atrioventricular block, first degree: Secondary | ICD-10-CM | POA: Diagnosis not present

## 2023-10-25 DIAGNOSIS — E1151 Type 2 diabetes mellitus with diabetic peripheral angiopathy without gangrene: Secondary | ICD-10-CM | POA: Diagnosis present

## 2023-10-25 DIAGNOSIS — R32 Unspecified urinary incontinence: Secondary | ICD-10-CM | POA: Diagnosis present

## 2023-10-25 DIAGNOSIS — R55 Syncope and collapse: Secondary | ICD-10-CM

## 2023-10-25 DIAGNOSIS — R680 Hypothermia, not associated with low environmental temperature: Secondary | ICD-10-CM | POA: Diagnosis present

## 2023-10-25 DIAGNOSIS — I5021 Acute systolic (congestive) heart failure: Secondary | ICD-10-CM | POA: Diagnosis not present

## 2023-10-25 DIAGNOSIS — I2510 Atherosclerotic heart disease of native coronary artery without angina pectoris: Secondary | ICD-10-CM | POA: Diagnosis present

## 2023-10-25 DIAGNOSIS — I255 Ischemic cardiomyopathy: Secondary | ICD-10-CM | POA: Diagnosis present

## 2023-10-25 DIAGNOSIS — D649 Anemia, unspecified: Secondary | ICD-10-CM | POA: Diagnosis present

## 2023-10-25 DIAGNOSIS — F419 Anxiety disorder, unspecified: Secondary | ICD-10-CM | POA: Diagnosis present

## 2023-10-25 DIAGNOSIS — N179 Acute kidney failure, unspecified: Secondary | ICD-10-CM | POA: Insufficient documentation

## 2023-10-25 DIAGNOSIS — I495 Sick sinus syndrome: Secondary | ICD-10-CM | POA: Diagnosis present

## 2023-10-25 DIAGNOSIS — F1910 Other psychoactive substance abuse, uncomplicated: Secondary | ICD-10-CM | POA: Insufficient documentation

## 2023-10-25 DIAGNOSIS — Z833 Family history of diabetes mellitus: Secondary | ICD-10-CM | POA: Diagnosis not present

## 2023-10-25 DIAGNOSIS — I472 Ventricular tachycardia, unspecified: Secondary | ICD-10-CM | POA: Diagnosis not present

## 2023-10-25 DIAGNOSIS — Z955 Presence of coronary angioplasty implant and graft: Secondary | ICD-10-CM

## 2023-10-25 DIAGNOSIS — F1010 Alcohol abuse, uncomplicated: Secondary | ICD-10-CM | POA: Diagnosis present

## 2023-10-25 DIAGNOSIS — Z888 Allergy status to other drugs, medicaments and biological substances status: Secondary | ICD-10-CM | POA: Diagnosis not present

## 2023-10-25 DIAGNOSIS — I252 Old myocardial infarction: Secondary | ICD-10-CM | POA: Diagnosis not present

## 2023-10-25 DIAGNOSIS — F1410 Cocaine abuse, uncomplicated: Secondary | ICD-10-CM | POA: Diagnosis present

## 2023-10-25 DIAGNOSIS — Z808 Family history of malignant neoplasm of other organs or systems: Secondary | ICD-10-CM

## 2023-10-25 DIAGNOSIS — E785 Hyperlipidemia, unspecified: Secondary | ICD-10-CM | POA: Diagnosis present

## 2023-10-25 DIAGNOSIS — Z79899 Other long term (current) drug therapy: Secondary | ICD-10-CM | POA: Diagnosis not present

## 2023-10-25 DIAGNOSIS — Z7984 Long term (current) use of oral hypoglycemic drugs: Secondary | ICD-10-CM | POA: Diagnosis not present

## 2023-10-25 DIAGNOSIS — I480 Paroxysmal atrial fibrillation: Secondary | ICD-10-CM | POA: Diagnosis present

## 2023-10-25 DIAGNOSIS — I10 Essential (primary) hypertension: Secondary | ICD-10-CM | POA: Diagnosis present

## 2023-10-25 DIAGNOSIS — Z91199 Patient's noncompliance with other medical treatment and regimen due to unspecified reason: Secondary | ICD-10-CM | POA: Diagnosis not present

## 2023-10-25 DIAGNOSIS — I5022 Chronic systolic (congestive) heart failure: Secondary | ICD-10-CM | POA: Insufficient documentation

## 2023-10-25 DIAGNOSIS — E118 Type 2 diabetes mellitus with unspecified complications: Secondary | ICD-10-CM | POA: Insufficient documentation

## 2023-10-25 DIAGNOSIS — Z7982 Long term (current) use of aspirin: Secondary | ICD-10-CM

## 2023-10-25 DIAGNOSIS — Z8249 Family history of ischemic heart disease and other diseases of the circulatory system: Secondary | ICD-10-CM

## 2023-10-25 DIAGNOSIS — I110 Hypertensive heart disease with heart failure: Secondary | ICD-10-CM | POA: Diagnosis present

## 2023-10-25 DIAGNOSIS — I739 Peripheral vascular disease, unspecified: Secondary | ICD-10-CM | POA: Diagnosis present

## 2023-10-25 HISTORY — DX: Syncope and collapse: R55

## 2023-10-25 HISTORY — PX: TEMPORARY PACEMAKER: CATH118268

## 2023-10-25 LAB — CBC WITH DIFFERENTIAL/PLATELET
Abs Immature Granulocytes: 0.01 10*3/uL (ref 0.00–0.07)
Basophils Absolute: 0 10*3/uL (ref 0.0–0.1)
Basophils Relative: 1 %
Eosinophils Absolute: 0.1 10*3/uL (ref 0.0–0.5)
Eosinophils Relative: 3 %
HCT: 32.9 % — ABNORMAL LOW (ref 39.0–52.0)
Hemoglobin: 10.6 g/dL — ABNORMAL LOW (ref 13.0–17.0)
Immature Granulocytes: 0 %
Lymphocytes Relative: 7 %
Lymphs Abs: 0.4 10*3/uL — ABNORMAL LOW (ref 0.7–4.0)
MCH: 28.9 pg (ref 26.0–34.0)
MCHC: 32.2 g/dL (ref 30.0–36.0)
MCV: 89.6 fL (ref 80.0–100.0)
Monocytes Absolute: 0.5 10*3/uL (ref 0.1–1.0)
Monocytes Relative: 8 %
Neutro Abs: 4.4 10*3/uL (ref 1.7–7.7)
Neutrophils Relative %: 81 %
Platelets: 195 10*3/uL (ref 150–400)
RBC: 3.67 MIL/uL — ABNORMAL LOW (ref 4.22–5.81)
RDW: 16.3 % — ABNORMAL HIGH (ref 11.5–15.5)
WBC: 5.4 10*3/uL (ref 4.0–10.5)
nRBC: 4.3 % — ABNORMAL HIGH (ref 0.0–0.2)

## 2023-10-25 LAB — COMPREHENSIVE METABOLIC PANEL
ALT: 39 U/L (ref 0–44)
AST: 40 U/L (ref 15–41)
Albumin: 2.7 g/dL — ABNORMAL LOW (ref 3.5–5.0)
Alkaline Phosphatase: 108 U/L (ref 38–126)
Anion gap: 11 (ref 5–15)
BUN: 22 mg/dL (ref 8–23)
CO2: 21 mmol/L — ABNORMAL LOW (ref 22–32)
Calcium: 9.9 mg/dL (ref 8.9–10.3)
Chloride: 108 mmol/L (ref 98–111)
Creatinine, Ser: 1.6 mg/dL — ABNORMAL HIGH (ref 0.61–1.24)
GFR, Estimated: 46 mL/min — ABNORMAL LOW (ref >60)
Glucose, Bld: 100 mg/dL — ABNORMAL HIGH (ref 70–99)
Potassium: 4.6 mmol/L (ref 3.5–5.1)
Sodium: 140 mmol/L (ref 135–145)
Total Bilirubin: 0.4 mg/dL (ref 0.0–1.2)
Total Protein: 7.1 g/dL (ref 6.5–8.1)

## 2023-10-25 LAB — MAGNESIUM: Magnesium: 1.7 mg/dL (ref 1.7–2.4)

## 2023-10-25 LAB — PROTIME-INR
INR: 1.2 (ref 0.8–1.2)
Prothrombin Time: 15.5 s — ABNORMAL HIGH (ref 11.4–15.2)

## 2023-10-25 LAB — CG4 I-STAT (LACTIC ACID): Lactic Acid, Venous: 1.6 mmol/L (ref 0.5–1.9)

## 2023-10-25 LAB — LIPID PANEL
Cholesterol: 141 mg/dL (ref 0–200)
HDL: 36 mg/dL — ABNORMAL LOW (ref >40)
LDL Cholesterol: 87 mg/dL (ref 0–99)
Total CHOL/HDL Ratio: 3.9 ratio
Triglycerides: 89 mg/dL (ref <150)
VLDL: 18 mg/dL (ref 0–40)

## 2023-10-25 LAB — BRAIN NATRIURETIC PEPTIDE: B Natriuretic Peptide: 2096.6 pg/mL — ABNORMAL HIGH (ref 0.0–100.0)

## 2023-10-25 LAB — HIV ANTIBODY (ROUTINE TESTING W REFLEX): HIV Screen 4th Generation wRfx: NONREACTIVE

## 2023-10-25 LAB — TSH: TSH: 1.21 u[IU]/mL (ref 0.350–4.500)

## 2023-10-25 LAB — APTT: aPTT: 35 s (ref 24–36)

## 2023-10-25 SURGERY — TEMPORARY PACEMAKER
Anesthesia: LOCAL

## 2023-10-25 MED ORDER — MAGNESIUM SULFATE 2 GM/50ML IV SOLN
2.0000 g | Freq: Once | INTRAVENOUS | Status: AC
  Administered 2023-10-25: 2 g via INTRAVENOUS
  Filled 2023-10-25 (×2): qty 50

## 2023-10-25 MED ORDER — LIDOCAINE HCL (PF) 1 % IJ SOLN
INTRAMUSCULAR | Status: DC | PRN
  Administered 2023-10-25: 5 mL

## 2023-10-25 MED ORDER — NICOTINE 14 MG/24HR TD PT24
14.0000 mg | MEDICATED_PATCH | Freq: Every day | TRANSDERMAL | Status: DC
  Administered 2023-10-25 – 2023-10-28 (×4): 14 mg via TRANSDERMAL
  Filled 2023-10-25 (×5): qty 1

## 2023-10-25 MED ORDER — ASPIRIN 81 MG PO TBEC
81.0000 mg | DELAYED_RELEASE_TABLET | Freq: Every day | ORAL | Status: DC
  Administered 2023-10-26 – 2023-10-28 (×3): 81 mg via ORAL
  Filled 2023-10-25 (×3): qty 1

## 2023-10-25 MED ORDER — SODIUM CHLORIDE 0.9% FLUSH
3.0000 mL | INTRAVENOUS | Status: DC | PRN
Start: 2023-10-25 — End: 2023-10-28

## 2023-10-25 MED ORDER — SODIUM CHLORIDE 0.9% FLUSH
3.0000 mL | Freq: Two times a day (BID) | INTRAVENOUS | Status: DC
  Administered 2023-10-25 – 2023-10-28 (×5): 3 mL via INTRAVENOUS

## 2023-10-25 MED ORDER — LIDOCAINE HCL (PF) 1 % IJ SOLN
INTRAMUSCULAR | Status: AC
  Filled 2023-10-25: qty 30

## 2023-10-25 MED ORDER — DAPAGLIFLOZIN PROPANEDIOL 10 MG PO TABS
10.0000 mg | ORAL_TABLET | Freq: Every day | ORAL | Status: DC
  Filled 2023-10-25: qty 1

## 2023-10-25 MED ORDER — HEPARIN (PORCINE) IN NACL 1000-0.9 UT/500ML-% IV SOLN
INTRAVENOUS | Status: DC | PRN
  Administered 2023-10-25: 500 mL via INTRAVENOUS

## 2023-10-25 MED ORDER — ROSUVASTATIN CALCIUM 5 MG PO TABS
10.0000 mg | ORAL_TABLET | Freq: Every day | ORAL | Status: DC
  Administered 2023-10-26 – 2023-10-28 (×3): 10 mg via ORAL
  Filled 2023-10-25 (×3): qty 2

## 2023-10-25 MED ORDER — DAPAGLIFLOZIN PROPANEDIOL 10 MG PO TABS: 10.0000 mg | ORAL_TABLET | Freq: Every day | ORAL | Status: DC

## 2023-10-25 MED ORDER — ACETAMINOPHEN 325 MG PO TABS: 650.0000 mg | ORAL_TABLET | ORAL | Status: DC | PRN

## 2023-10-25 MED ORDER — ENOXAPARIN SODIUM 40 MG/0.4ML IJ SOSY
40.0000 mg | PREFILLED_SYRINGE | INTRAMUSCULAR | Status: DC
  Administered 2023-10-25: 40 mg via SUBCUTANEOUS
  Filled 2023-10-25: qty 0.4

## 2023-10-25 MED ORDER — FERROUS SULFATE 325 (65 FE) MG PO TABS
325.0000 mg | ORAL_TABLET | Freq: Every day | ORAL | Status: DC
  Administered 2023-10-26 – 2023-10-28 (×3): 325 mg via ORAL
  Filled 2023-10-25 (×3): qty 1

## 2023-10-25 MED ORDER — SODIUM CHLORIDE 0.9 % IV SOLN
250.0000 mL | INTRAVENOUS | Status: AC | PRN
  Administered 2023-10-25: 250 mL via INTRAVENOUS

## 2023-10-25 SURGICAL SUPPLY — 8 items
CATH-GARD ARROW CATH SHIELD (MISCELLANEOUS) ×1 IMPLANT
ELECT DEFIB PAD ADLT CADENCE (PAD) IMPLANT
PACK CARDIAC CATHETERIZATION (CUSTOM PROCEDURE TRAY) IMPLANT
SHEATH PINNACLE 6F 10CM (SHEATH) IMPLANT
SHEATH PROBE COVER 6X72 (BAG) IMPLANT
SHIELD CATHGARD ARROW (MISCELLANEOUS) IMPLANT
WIRE MICRO SET SILHO 5FR 7 (SHEATH) IMPLANT
WIRE PACING TEMP ST TIP 5 (CATHETERS) IMPLANT

## 2023-10-25 NOTE — Progress Notes (Signed)
 Heart Failure Navigator Progress Note  Assessed for Heart & Vascular TOC clinic readiness.  Patient does not meet criteria due to Advanced Heart Failure Team consulted. .   Navigator will sign off at this time.   Rhae Hammock, BSN, Scientist, clinical (histocompatibility and immunogenetics) Only

## 2023-10-25 NOTE — H&P (Addendum)
 Advanced Heart Failure Team History and Physical Note   PCP:  Galvin Proffer, MD  PCP-Cardiology: None     Reason for Admission: Syncope  HPI:   Austin Espinoza is a 68 y.o. AAM with chronic HFrEF, CAD (CTO of RCA, prior LAD stent 07), paroxysmal atrial fibrillation (on ASA, failed Xarelto with hemoptysis and anemia), HTN and HLD.  Austin Espinoza was transferred to [facility] for possible TPM. He reports ~2-3 weeks of intermittent syncope but reports not "feeling well" for the last 6 months. Recently went back to see his VSS team for his PVD d/t weakness. He lives with his brother. The morning of admission he almost passed out again and his brother decided to call EMS. He had also been having intermittent incontinence. Reports using cocaine every other day. Reports intt ETOH use. Smokes 1/2 PPD. Was on medicines PTA, does not know which ones and does report missing doses.   Admission labs reviewed (paper copy in chart) temp 93.1, SBP 120s, resp panel (-), Na 143, K 4.8, SCr 1.2, lactic acid 2.1, BNP 5650, HsTrop (-), HgbA1c 5.9. UDS + cocaine.  Echo 2/25 showed EF 30-35%, mild-mod MR, mild-mod TR. EKG with NSR. Strips reviewed with prolonged pauses. During admission he experienced prolonged pauses requiring brief CPR (approx 3-4 compressions) 4 times. Transferred to [facility] for TPM.   Resting comfortably in bed. Denies CP/SOB. En route was having PVCs and EMT reports that the patient was very symptomatic. POC Lactic 1.6.   Cardiac studies reviewed:  Echo 2/25 showed EF 30-35%, mild-mod MR, mild-mod TR.  myocardial perfusion study--03/2017 fixed defect, minimal peri-infarct ischemia  Home Medications Prior to Admission medications   Medication Sig Start Date End Date Taking? Authorizing Provider  aspirin EC 81 MG tablet Take 81 mg by mouth daily.    [provider]  carvedilol (COREG) 6.25 MG tablet Take 6.25 mg by mouth 2 (two) times daily.    [provider]  cholecalciferol  (VITAMIN D) 400 units TABS tablet Take 400 Units by mouth every evening.    [provider]  gabapentin (NEURONTIN) 300 MG capsule Take 600 mg by mouth 3 (three) times daily.     [provider]  metFORMIN (GLUCOPHAGE) 500 MG tablet Take 500 mg by mouth 2 (two) times daily with a meal.  10/17/14   [provider]  rosuvastatin (CRESTOR) 10 MG tablet Take 10 mg by mouth daily.    [provider]    Past Medical History: Past Medical History:  Diagnosis Date   Allergy    Anemia    Anxiety    CAD (coronary artery disease)    Bare-metal stent, LAD, 2007, total RCA, right to right collaterals   Dyslipidemia    Ejection fraction < 50%    EF 40% in the past   Family history of colonic polyps    History of blood transfusion 01/29/2015   post op   Hyperlipemia    Hypertension    Myocardial infarction [facility])    PAD (peripheral artery disease) (HCC)    Arterial leg Doppler, December 29, 2011, greater than 50% focal stenosis of the left mid S.FA, right ABI mild range left ABI moderate range  //   consultation by Dr. Clifton James, Jan 19, 2012. CTA with severe right CIA stenosis, bil SFA.    Tobacco abuse    Type II diabetes mellitus (HCC)     Past Surgical History: Past Surgical History:  Procedure Laterality Date   ABDOMINAL  AORTAGRAM N/A 04/06/2012   Procedure: ABDOMINAL Ronny Flurry;  Surgeon: Kathleene Hazel, MD;  Location: [facility] CATH LAB;  Service: Cardiovascular;  Laterality: N/A;   ABDOMINAL AORTOGRAM W/LOWER EXTREMITY N/A 12/31/2016   Procedure: Abdominal Aortogram w/Lower Extremity;  Surgeon: Fransisco Hertz, MD;  Location: [facility] INVASIVE CV LAB;  Service: Cardiovascular;  Laterality: N/A;   COLONOSCOPY W/ POLYPECTOMY  11/21/2015   Colonic polyps status post polypectomy. Mild sigmoid diverticulosis   CORONARY ANGIOPLASTY WITH STENT PLACEMENT     2008, 07/19/2015   ENDARTERECTOMY FEMORAL Bilateral 01/29/2015   Procedure: ENDARTERECTOMY RIGHT FEMORAL ARTERY AND  LEFT FEMORAL ARTERY;  Surgeon: Fransisco Hertz, MD;  Location: [facility] OR;  Service: Vascular;  Laterality: Bilateral;   FEMORAL-FEMORAL BYPASS GRAFT Bilateral 01/29/2015   Procedure: BYPASS GRAFT LEFT FEMORAL ARTERY TO RIGHT FEMORAL ARTERY USING GORE PROPATEN GRAFT;  Surgeon: Fransisco Hertz, MD;  Location: [facility] OR;  Service: Vascular;  Laterality: Bilateral;   INTRAOPERATIVE ARTERIOGRAM Left 01/29/2015   Procedure: INTRA OPERATIVE ARTERIOGRAM, LEFT ILIAC ARTERY;  Surgeon: Fransisco Hertz, MD;  Location: [facility] OR;  Service: Vascular;  Laterality: Left;   PATCH ANGIOPLASTY Left 01/29/2015   Procedure: LEFT FEMORAL ARTERY PATCH ANGIOPLASTY USING VASCUGUARD PATCH;  Surgeon: Fransisco Hertz, MD;  Location: [facility] OR;  Service: Vascular;  Laterality: Left;   PERIPHERAL VASCULAR BALLOON ANGIOPLASTY Left 12/31/2016   Procedure: Peripheral Vascular Balloon Angioplasty;  Surgeon: Fransisco Hertz, MD;  Location: [facility] INVASIVE CV LAB;  Service: Cardiovascular;  Laterality: Left;  lt ext iliac   PERIPHERAL VASCULAR CATHETERIZATION N/A 01/14/2015   Procedure: Abdominal Aortogram;  Surgeon: Fransisco Hertz, MD;  Location: [facility] INVASIVE CV LAB;  Service: Cardiovascular;  Laterality: N/A;   PERIPHERAL VASCULAR CATHETERIZATION Bilateral 01/14/2015   Procedure: Lower Extremity Angiography;  Surgeon: Fransisco Hertz, MD;  Location: [facility] INVASIVE CV LAB;  Service: Cardiovascular;  Laterality: Bilateral;   PERIPHERAL VASCULAR CATHETERIZATION N/A 07/18/2015   Procedure: Abdominal Aortogram;  Surgeon: Fransisco Hertz, MD;  Location: [facility] INVASIVE CV LAB;  Service: Cardiovascular;  Laterality: N/A;   THROMBECTOMY FEMORAL ARTERY Left 01/29/2015   Procedure: THROMBECTOMY LEFT FEMORAL ARTERY;  Surgeon: Fransisco Hertz, MD;  Location: [facility] OR;  Service: Vascular;  Laterality: Left;    Family History:  Family History  Problem Relation Age of Onset   Heart attack Father    Heart disease Father    Diabetes Mother    Throat cancer Brother    Hypertension Sister    Colon cancer  Neg Hx    Esophageal cancer Neg Hx    Rectal cancer Neg Hx    Stomach cancer Neg Hx     Social History: Social History   Socioeconomic History   Marital status: Divorced    Spouse name: Not on file   Number of children: 0   Years of education: Not on file   Highest education level: Not on file  Occupational History   Occupation: Works at [employer] 3rd shift   Tobacco Use   Smoking status: Light Smoker    Current packs/day: 0.25    Average packs/day: 0.3 packs/day for 45.0 years (11.3 ttl pk-yrs)    Types: Cigarettes   Smokeless tobacco: Never   Tobacco comments:    1/2 pk per day  Vaping Use   Vaping status: Never Used  Substance and Sexual Activity   Alcohol use: No    Alcohol/week: 0.0 standard drinks of alcohol   Drug use: No   Sexual activity: Not  Currently  Other Topics Concern   Not on file  Social History Narrative   Not on file   Social Drivers of Health   Financial Resource Strain: Not on file  Food Insecurity: Not on file  Transportation Needs: Not on file  Physical Activity: Not on file  Stress: Not on file  Social Connections: Not on file    Allergies:  Allergies  Allergen Reactions   Lisinopril Swelling   Ibuprofen Itching and Rash    Itching/rash with Duexis (famotidine and ibuprofen)    Objective:    Vital Signs:   Temp:  [96.6 F (35.9 C)] 96.6 F (35.9 C) (02/24 1400) BP: (143)/(78) 143/78 (02/24 1400) Weight:  [72.5 kg] 72.5 kg (02/24 1400)   Filed Weights   10/25/23 1400  Weight: 72.5 kg   Physical Exam     General:  well appearing.  No respiratory difficulty HEENT: normal Neck: supple. JVD flat.  Cor: PMI nondisplaced. Regular rate & rhythm. No rubs, gallops or murmurs. Lungs: clear Abdomen: soft, nontender, nondistended. Good bowel sounds. Extremities: no cyanosis, clubbing, rash, edema  Neuro: alert & oriented x 3. Moves all 4 extremities w/o difficulty. Affect pleasant.   Telemetry   NSR 80s (Personally reviewed)     EKG   Order placed for EKG  Labs     Basic Metabolic Panel: No results for input(s): "NA", "K", "CL", "CO2", "GLUCOSE", "BUN", "CREATININE", "CALCIUM", "MG", "PHOS" in the last 168 hours.  Liver Function Tests: No results for input(s): "AST", "ALT", "ALKPHOS", "BILITOT", "PROT", "ALBUMIN" in the last 168 hours. No results for input(s): "LIPASE", "AMYLASE" in the last 168 hours. No results for input(s): "AMMONIA" in the last 168 hours.  CBC: No results for input(s): "WBC", "NEUTROABS", "HGB", "HCT", "MCV", "PLT" in the last 168 hours.  Cardiac Enzymes: No results for input(s): "CKTOTAL", "CKMB", "CKMBINDEX", "TROPONINI" in the last 168 hours.  BNP: BNP (last 3 results) No results for input(s): "BNP" in the last 8760 hours.  ProBNP (last 3 results) No results for input(s): "PROBNP" in the last 8760 hours.   CBG: No results for input(s): "GLUCAP" in the last 168 hours.  Coagulation Studies: No results for input(s): "LABPROT", "INR" in the last 72 hours.  Imaging: No results found.   Patient Profile  Austin Espinoza is a 68 y.o. AAM with chronic HFrEF, CAD (CTO of RCA, prior LAD stent 07), paroxysmal atrial fibrillation (on ASA, failed Xarelto with hemoptysis and anemia), HTN and HLD. Admitted with Syncope.   Assessment/Plan  Syncope with prolonged pauses - admitted with multiple syncopal episodes - required brief CPR at [facility]. Tele review with prolonged (mostly) nocturnal pauses in chart. - Stable on tele today - Consult EP for possible PM - May need temp wire placement - UDS + cocaine - Keep K>4, Mg >2  A/C HFrEF, iCM - [place] echo 2/25 showed EF 30-35%, mild-mod MR, mild-mod TR.  - NYHA IV on admission - Appears euvolemic - A1c 5.9. Can continue Farxiga 10 mg daily - Would avoid BB with cocaine use - Documented ACE-I allergy - Can add spiro if labs stable - POC lactic acid 1.6 - update echo here - Strict I&O, daily weights - Not a  candidate for advanced therapies with polysubstance abuse and hx noncompliance.   CAD - Had cath here in 2007 (not available in chart) - HsTrops (-) - Denies CP - Continue ASA - check lipid panel. Continue statin  HTN - Will follow for now - No ACE/ARB  with allergy  HLD - update lipid panel, continue statin  Cocaine abuse - reports every other day use - cessation advised - will consult TOC for resources  ETOH use - intermittently drinks - cessation advised  Tobacco abuse - smokes 1/2 PPD - cessation advised  PVD - Followed with Dr. Hetty Blend - s/p [facility] angioplasty of the left iliac system, 2018  CRITICAL CARE Performed by: Alen Bleacher  Total critical care time: 20 minutes  Critical care time was exclusive of separately billable procedures and treating other patients.  Critical care was necessary to treat or prevent imminent or life-threatening deterioration.  Critical care was time spent personally by me on the following activities: development of treatment plan with patient and/or surrogate as well as nursing, discussions with consultants, evaluation of patient's response to treatment, examination of patient, obtaining history from patient or surrogate, ordering and performing treatments and interventions, ordering and review of laboratory studies, ordering and review of radiographic studies, pulse oximetry and re-evaluation of patient's condition.    Alen Bleacher, NP 10/25/2023, 2:59 PM  Advanced Heart Failure Team Pager [phone] (M-F; 7a - 5p)  Please contact [facility] for night-coverage after hours (4p -7a ) and weekends on [email or web address]  Agree with above   69 y/o male with h/o of CAD, PAD, polysubstance abuse and systolic HF EF 30-35%  Transferred from [facility] with recurrent syncope due to sinus arrest with ventricular standstill.   Trop negative.  No s/s angina  Seen by EP felt to need emergent TVP prior to PPm   On arrival to cath lab had  recurrent event  General:  Chronically ill appearing. No resp difficulty HEENT: normal Neck: supple. no JVD. Carotids 2+ bilat; no bruits. No lymphadenopathy or thryomegaly appreciated. Cor: PMI nondisplaced. Regular rate & rhythm. No rubs, gallops or murmurs. Lungs: decreased throughout  Abdomen: soft, nontender, nondistended. No hepatosplenomegaly. No bruits or masses. Good bowel sounds. Extremities: no cyanosis, clubbing, rash, edema Neuro: alert & orientedx3, cranial nerves grossly intact. moves all 4 extremities w/o difficulty. 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   Likely with severe underlying CAD but conduction issues don't seem ischemically driven and as he is not having angina and is not a CABG candidate will not pursue coronary angio at this time.   Will place emergent TVP to stabilize rhythm. EP to plan for permanent device.   Titrate GDMT as able.   CRITICAL CARE Performed by: Arvilla Meres  Total critical care time: 45 minutes  Critical care time was exclusive of separately billable procedures and treating other patients.  Critical care was necessary to treat or prevent imminent or life-threatening deterioration.  Critical care was time spent personally by me (independent of midlevel providers or residents) on the following activities: development of treatment plan with patient and/or surrogate as well as nursing, discussions with consultants, evaluation of patient's response to treatment, examination of patient, obtaining history from patient or surrogate, ordering and performing treatments and interventions, ordering and review of laboratory studies, ordering and review of radiographic studies, pulse oximetry and re-evaluation of patient's condition.  Arvilla Meres, MD  5:16 PM

## 2023-10-25 NOTE — Consult Note (Signed)
 ELECTROPHYSIOLOGY CONSULT NOTE    Patient ID: Muad Noga MRN: [id number], DOB/AGE: 70-Dec-1957 70 y.o.  Admit date: 10/25/2023 Date of Consult: 10/25/2023  Primary Physician: Galvin Proffer, MD Primary Cardiologist: None  Electrophysiologist: New   Referring Provider: Dr. Gala Romney   Patient Profile: Maleke Feria is a 68 y.o. male with a history of HFrEF, CAD (CTO of RCA, prior LAD stent), PAF on ASA, OFF OAC with hemoptysis and anemia, polysubstance abuse, HTN, and HLD who is being seen today for the evaluation of syncope and pauses at the request of Dr. Gala Romney.  HPI:  Jemuel Laursen is a 68 y.o. male with medical history as above. Transferred from [place] for possible temp wire.   Presented with ~2-3 weeks of intermittent syncope but reports not "feeling well" for the last 6 months. Recently went back to see his VSS team for his PVD d/t weakness. He lives with his brother. Brother called EMS with near syncope. He has intermittent incontinence. Reports using cocaine every other day. Reports intermittent alcohol use. Smokes 1/2 PPD. Was on medicines PTA, does not know which ones and does report missing doses.   Paper chart from [place] reviewed. On admission temp 93.1, SBP 120s, resp panel (-), Na 143, K 4.8, SCr 1.2, lactic acid 2.1, BNP 5650, HsTrop (-), HgbA1c 5.9. UDS + cocaine.   Echo showed EF 30-35%, mild-mod MR, mild-mod TR. EKG with NSR. Strips reviewed with prolonged pauses. During admission he experienced prolonged pauses requiring brief CPR (approx 3-4 compressions) 4 times and prompting referral to [facility] for possible temp pacer.   Currently he is awake and alert. Repeats history as above. Denies recent ETOH use now though, and states he was doing cocaine "every day". Denies having had withdrawal previously. Denies fever or chills.   Remains hypothermic and bair hugger placed.    Labs Potassium4.6 (02/24 1430) Magnesium  1.7 (02/24 1430) Creatinine, ser  1.60*  (02/24 1430) PLT  195 (02/24 1430) HGB  10.6* (02/24 1430) WBC 5.4 (02/24 1430)  .     Surgical History:  Past Surgical History:  Procedure Laterality Date   ABDOMINAL AORTAGRAM N/A 04/06/2012   Procedure: ABDOMINAL Ronny Flurry;  Surgeon: Kathleene Hazel, MD;  Location: [facility] CATH LAB;  Service: Cardiovascular;  Laterality: N/A;   ABDOMINAL AORTOGRAM W/LOWER EXTREMITY N/A 12/31/2016   Procedure: Abdominal Aortogram w/Lower Extremity;  Surgeon: Fransisco Hertz, MD;  Location: [facility] INVASIVE CV LAB;  Service: Cardiovascular;  Laterality: N/A;   COLONOSCOPY W/ POLYPECTOMY  11/21/2015   Colonic polyps status post polypectomy. Mild sigmoid diverticulosis   CORONARY ANGIOPLASTY WITH STENT PLACEMENT     2008, 07/19/2015   ENDARTERECTOMY FEMORAL Bilateral 01/29/2015   Procedure: ENDARTERECTOMY RIGHT FEMORAL ARTERY AND LEFT FEMORAL ARTERY;  Surgeon: Fransisco Hertz, MD;  Location: [facility] OR;  Service: Vascular;  Laterality: Bilateral;   FEMORAL-FEMORAL BYPASS GRAFT Bilateral 01/29/2015   Procedure: BYPASS GRAFT LEFT FEMORAL ARTERY TO RIGHT FEMORAL ARTERY USING GORE PROPATEN GRAFT;  Surgeon: Fransisco Hertz, MD;  Location: [facility] OR;  Service: Vascular;  Laterality: Bilateral;   INTRAOPERATIVE ARTERIOGRAM Left 01/29/2015   Procedure: INTRA OPERATIVE ARTERIOGRAM, LEFT ILIAC ARTERY;  Surgeon: Fransisco Hertz, MD;  Location: [facility] OR;  Service: Vascular;  Laterality: Left;   PATCH ANGIOPLASTY Left 01/29/2015   Procedure: LEFT FEMORAL ARTERY PATCH ANGIOPLASTY USING VASCUGUARD PATCH;  Surgeon: Fransisco Hertz, MD;  Location: [facility] OR;  Service: Vascular;  Laterality: Left;   PERIPHERAL VASCULAR BALLOON ANGIOPLASTY Left 12/31/2016   Procedure:  Peripheral Vascular Balloon Angioplasty;  Surgeon: Fransisco Hertz, MD;  Location: [facility] INVASIVE CV LAB;  Service: Cardiovascular;  Laterality: Left;  lt ext iliac   PERIPHERAL VASCULAR CATHETERIZATION N/A 01/14/2015   Procedure: Abdominal Aortogram;  Surgeon: Fransisco Hertz, MD;  Location: [facility] INVASIVE CV LAB;   Service: Cardiovascular;  Laterality: N/A;   PERIPHERAL VASCULAR CATHETERIZATION Bilateral 01/14/2015   Procedure: Lower Extremity Angiography;  Surgeon: Fransisco Hertz, MD;  Location: [facility] INVASIVE CV LAB;  Service: Cardiovascular;  Laterality: Bilateral;   PERIPHERAL VASCULAR CATHETERIZATION N/A 07/18/2015   Procedure: Abdominal Aortogram;  Surgeon: Fransisco Hertz, MD;  Location: [facility] INVASIVE CV LAB;  Service: Cardiovascular;  Laterality: N/A;   THROMBECTOMY FEMORAL ARTERY Left 01/29/2015   Procedure: THROMBECTOMY LEFT FEMORAL ARTERY;  Surgeon: Fransisco Hertz, MD;  Location: [facility] OR;  Service: Vascular;  Laterality: Left;     Facility-Administered Medications Prior to Admission  Medication Dose Route Frequency Provider Last Rate Last Admin   0.9 %  sodium chloride infusion  500 mL Intravenous Once Lynann Bologna, MD       Medications Prior to Admission  Medication Sig Dispense Refill Last Dose/Taking   aspirin EC 81 MG tablet Take 81 mg by mouth daily.      carvedilol (COREG) 6.25 MG tablet Take 6.25 mg by mouth 2 (two) times daily.      cholecalciferol (VITAMIN D) 400 units TABS tablet Take 400 Units by mouth every evening.      gabapentin (NEURONTIN) 300 MG capsule Take 600 mg by mouth 3 (three) times daily.       metFORMIN (GLUCOPHAGE) 500 MG tablet Take 500 mg by mouth 2 (two) times daily with a meal.       rosuvastatin (CRESTOR) 10 MG tablet Take 10 mg by mouth daily.       Inpatient Medications:   [START ON 10/26/2023] aspirin EC  81 mg Oral Daily   dapagliflozin propanediol  10 mg Oral Daily   enoxaparin (LOVENOX) injection  40 mg Subcutaneous Q24H   [START ON 10/26/2023] ferrous sulfate  325 mg Oral Q breakfast   [START ON 10/26/2023] rosuvastatin  10 mg Oral Daily   sodium chloride flush  3 mL Intravenous Q12H    Allergies:  Allergies  Allergen Reactions   Lisinopril Swelling   Ibuprofen Itching and Rash    Itching/rash with Duexis (famotidine and ibuprofen)    Family History   Problem Relation Age of Onset   Heart attack Father    Heart disease Father    Diabetes Mother    Throat cancer Brother    Hypertension Sister    Colon cancer Neg Hx    Esophageal cancer Neg Hx    Rectal cancer Neg Hx    Stomach cancer Neg Hx      Physical Exam: Vitals:   10/25/23 1400 10/25/23 1415 10/25/23 1430 10/25/23 1445  BP: (!) 143/78 (!) 141/89 138/76 (!) 143/78  Pulse: 77 74 70 75  Resp: 19 (!) 22 (!) 21 (!) 22  Temp: (!) 96.6 F (35.9 C)     TempSrc: Bladder     SpO2: 100% 92% 96% 99%  Weight: 72.5 kg     Height: 5\' 10"  (1.778 m)       GEN- NAD, A&O x 3, normal affect HEENT: Normocephalic, atraumatic Lungs- CTAB, Normal effort.  Heart- Regular rate and rhythm, No M/G/R.  GI- Soft, NT, ND.  Extremities- No clubbing, cyanosis, or edema   Radiology/Studies: VAS [place]  ABI WITH/WO TBI Result Date: 10/08/2023  LOWER EXTREMITY DOPPLER STUDY Patient Name:  KELBY ADELL  Date of Exam:   10/08/2023 Medical Rec #: [id number]        Accession #:    [id number] Date of Birth: 11-03-1955        Patient Gender: M Patient Age:   1 years Exam Location:  [facility] Procedure:      VAS [place] ABI WITH/WO TBI Referring Phys: Carolynn Sayers --------------------------------------------------------------------------------  Indications: Claudication. High Risk Factors: Hypertension, hyperlipidemia, coronary artery disease.  Vascular Interventions: Patient is status post left common and external iliac                         stents 07/18/2015. Left to right Fem-Fem BPG 01/29/2015. Comparison Study: 11/03/18 prior Performing Technologist: [place] Ponder RVS  Examination Guidelines: A complete evaluation includes at minimum, Doppler waveform signals and systolic blood pressure reading at the level of bilateral brachial, anterior tibial, and posterior tibial arteries, when vessel segments are accessible. Bilateral testing is considered an integral part of a complete examination.  Photoelectric Plethysmograph (PPG) waveforms and toe systolic pressure readings are included as required and additional duplex testing as needed. Limited examinations for reoccurring indications may be performed as noted.  ABI Findings: +---------+------------------+-----+----------+--------+ Right    Rt Pressure (mmHg)IndexWaveform  Comment  +---------+------------------+-----+----------+--------+ Brachial 146                                       +---------+------------------+-----+----------+--------+ PTA      136               0.93 monophasic         +---------+------------------+-----+----------+--------+ DP       111               0.76 monophasic         +---------+------------------+-----+----------+--------+ Great Toe78                0.53                    +---------+------------------+-----+----------+--------+ +---------+------------------+-----+----------+-------+ Left     Lt Pressure (mmHg)IndexWaveform  Comment +---------+------------------+-----+----------+-------+ Brachial 147                                      +---------+------------------+-----+----------+-------+ PTA      117               0.80 monophasic        +---------+------------------+-----+----------+-------+ DP       150               1.02 monophasic        +---------+------------------+-----+----------+-------+ Great Toe85                0.58                   +---------+------------------+-----+----------+-------+ +-------+-----------+-----------+------------+------------+ ABI/TBIToday's ABIToday's TBIPrevious ABIPrevious TBI +-------+-----------+-----------+------------+------------+ Right  0.93       0.53       1.01        0.57         +-------+-----------+-----------+------------+------------+ Left   1.02       0.58       0.98        0.67         +-------+-----------+-----------+------------+------------+  Summary: Right: Resting right ankle-brachial  index indicates mild right lower extremity arterial disease. Left: Resting left ankle-brachial index is within normal range. *See table(s) above for measurements and observations.  Electronically signed by Carolynn Sayers on 10/08/2023 at 9:41:25 AM.    Final    VAS [place] FEMORAL-FEMORAL BYPASS GRAFT Result Date: 10/08/2023 LOWER EXTREMITY ARTERIAL DUPLEX STUDY Patient Name:  CHASKE PASKETT  Date of Exam:   10/08/2023 Medical Rec #: [id number]        Accession #:    [id number] Date of Birth: [birth year]        Patient Gender: M Patient Age:   22 years Exam Location:  [facility] Procedure:      VAS [place] FEMORAL-FEMORAL BYPASS GRAFT Referring Phys: Carolynn Sayers --------------------------------------------------------------------------------  Indications: Claudication. High Risk Factors: Hypertension, hyperlipidemia, coronary artery disease.  Vascular Interventions: Patient is status post left common and external iliac                         stents 07/18/2015. Left to right Fem-Fem BPG 01/29/2015. Current ABI:            R 0.93 L 1.02 Comparison Study: 11/03/18 Performing Technologist: [place] Ponder RVS  Examination Guidelines: A complete evaluation includes B-mode imaging, spectral Doppler, color Doppler, and power Doppler as needed of all accessible portions of each vessel. Bilateral testing is considered an integral part of a complete examination. Limited examinations for reoccurring indications may be performed as noted.   Left Graft #1: fem-fem +--------------------+--------+--------+----------+--------+                     PSV cm/sStenosisWaveform  Comments +--------------------+--------+--------+----------+--------+ Inflow              127             biphasic           +--------------------+--------+--------+----------+--------+ Proximal Anastomosis106             biphasic           +--------------------+--------+--------+----------+--------+ Proximal Graft      112              biphasic           +--------------------+--------+--------+----------+--------+ Mid Graft           78              monophasic         +--------------------+--------+--------+----------+--------+ Distal Graft        100             monophasic         +--------------------+--------+--------+----------+--------+ Distal Anastomosis  88              monophasic         +--------------------+--------+--------+----------+--------+ Outflow             72              monophasic         +--------------------+--------+--------+----------+--------+   Summary: Left: Patent fem-fem bypass without evidence of stenosis.  See table(s) above for measurements and observations. Electronically signed by Carolynn Sayers on 10/08/2023 at 9:41:08 AM.    Final     EKG: Strips in paper chart reviewed which show NSR predominantly, as well as several very long pauses in setting of sinus arrest. (personally reviewed)  TELEMETRY: NSR 70s currently,  (personally reviewed)  Assessment/Plan:  Syncope Sinus arrest with ventricular standstill Unclear  what is driving this with no obvious conduction disease at baseline despite CAD h/o With significant pause, recommend temp wire today and follow course.   Acute on chronic CHF ICM EF 30-35%, mild mod MR, mild mod TR at [place] Per HF team appears euvolemic.  Not candidate for advanced therapies with polysubstance abuse and hx non-compliance.   Cocaine abuse ETOH abuse UDS+ cocaine Reports having stopped ETOH abuse for several months.   Hypothermia PNA Low threshold to include CCM.   CAD Tobacco abuse PVD Lipids pending Most recent stent appears 2016 by chart  For questions or updates, please contact [facility] Please consult [email or web address] for contact info under Cardiology/STEMI.  Dustin Flock, PA-C  10/25/2023 4:04 PM

## 2023-10-26 ENCOUNTER — Inpatient Hospital Stay (HOSPITAL_COMMUNITY)
Admission: AD | Disposition: A | Discharge status: Home Health | Payer: Self-pay | Source: Other Acute Inpatient Hospital | Attending: Cardiology

## 2023-10-26 ENCOUNTER — Encounter (HOSPITAL_COMMUNITY): Payer: Self-pay | Admitting: Internal Medicine

## 2023-10-26 ENCOUNTER — Inpatient Hospital Stay (HOSPITAL_COMMUNITY): Payer: 59

## 2023-10-26 DIAGNOSIS — I5022 Chronic systolic (congestive) heart failure: Secondary | ICD-10-CM | POA: Diagnosis not present

## 2023-10-26 DIAGNOSIS — R55 Syncope and collapse: Secondary | ICD-10-CM | POA: Diagnosis not present

## 2023-10-26 DIAGNOSIS — I495 Sick sinus syndrome: Secondary | ICD-10-CM | POA: Diagnosis not present

## 2023-10-26 DIAGNOSIS — I5021 Acute systolic (congestive) heart failure: Secondary | ICD-10-CM

## 2023-10-26 HISTORY — PX: PACEMAKER IMPLANT: EP1218

## 2023-10-26 LAB — BASIC METABOLIC PANEL
Anion gap: 11 (ref 5–15)
BUN: 20 mg/dL (ref 8–23)
CO2: 20 mmol/L — ABNORMAL LOW (ref 22–32)
Calcium: 9.9 mg/dL (ref 8.9–10.3)
Chloride: 110 mmol/L (ref 98–111)
Creatinine, Ser: 1.5 mg/dL — ABNORMAL HIGH (ref 0.61–1.24)
GFR, Estimated: 50 mL/min — ABNORMAL LOW (ref >60)
Glucose, Bld: 95 mg/dL (ref 70–99)
Potassium: 4.5 mmol/L (ref 3.5–5.1)
Sodium: 141 mmol/L (ref 135–145)

## 2023-10-26 LAB — SURGICAL PCR SCREEN
MRSA, PCR: NEGATIVE
Staphylococcus aureus: POSITIVE — AB

## 2023-10-26 LAB — ECHOCARDIOGRAM COMPLETE
Area-P 1/2: 7.16 cm2
Calc EF: 29.9 %
Height: 70 in
MV M vel: 4.59 m/s
MV Peak grad: 84.3 mmHg
Radius: 0.45 cm
S' Lateral: 5.5 cm
Single Plane A2C EF: 27.4 %
Single Plane A4C EF: 36.6 %
Weight: 2472.68 [oz_av]

## 2023-10-26 LAB — IRON AND TIBC
Iron: 40 ug/dL — ABNORMAL LOW (ref 45–182)
Saturation Ratios: 10 % — ABNORMAL LOW (ref 17.9–39.5)
TIBC: 407 ug/dL (ref 250–450)
UIBC: 367 ug/dL

## 2023-10-26 LAB — RETICULOCYTES
Immature Retic Fract: 37.1 % — ABNORMAL HIGH (ref 2.3–15.9)
RBC.: 3.65 MIL/uL — ABNORMAL LOW (ref 4.22–5.81)
Retic Count, Absolute: 81 10*3/uL (ref 19.0–186.0)
Retic Ct Pct: 2.2 % (ref 0.4–3.1)

## 2023-10-26 LAB — MAGNESIUM: Magnesium: 2.1 mg/dL (ref 1.7–2.4)

## 2023-10-26 LAB — VITAMIN B12: Vitamin B-12: 432 pg/mL (ref 180–914)

## 2023-10-26 LAB — FERRITIN: Ferritin: 45 ng/mL (ref 24–336)

## 2023-10-26 LAB — FOLATE: Folate: 6.2 ng/mL (ref >5.9)

## 2023-10-26 SURGERY — PACEMAKER IMPLANT
Anesthesia: LOCAL

## 2023-10-26 MED ORDER — CEFAZOLIN SODIUM-DEXTROSE 1-4 GM/50ML-% IV SOLN
1.0000 g | Freq: Four times a day (QID) | INTRAVENOUS | Status: AC
  Administered 2023-10-26 – 2023-10-27 (×3): 1 g via INTRAVENOUS
  Filled 2023-10-26 (×6): qty 50

## 2023-10-26 MED ORDER — HEPARIN (PORCINE) IN NACL 1000-0.9 UT/500ML-% IV SOLN
INTRAVENOUS | Status: DC | PRN
  Administered 2023-10-26: 500 mL

## 2023-10-26 MED ORDER — GENTAMICIN SULFATE 40 MG/ML IJ SOLN
INTRAMUSCULAR | Status: AC
  Filled 2023-10-26: qty 2

## 2023-10-26 MED ORDER — SODIUM CHLORIDE 0.9 % IV SOLN
INTRAVENOUS | Status: AC
  Filled 2023-10-26: qty 2

## 2023-10-26 MED ORDER — SODIUM CHLORIDE 0.9% FLUSH
3.0000 mL | INTRAVENOUS | Status: DC | PRN
Start: 2023-10-26 — End: 2023-10-26

## 2023-10-26 MED ORDER — CEFAZOLIN SODIUM-DEXTROSE 2-4 GM/100ML-% IV SOLN
2.0000 g | INTRAVENOUS | Status: AC
  Administered 2023-10-26: 2 g via INTRAVENOUS

## 2023-10-26 MED ORDER — CEFAZOLIN SODIUM-DEXTROSE 2-4 GM/100ML-% IV SOLN
INTRAVENOUS | Status: AC
  Filled 2023-10-26: qty 100

## 2023-10-26 MED ORDER — SODIUM CHLORIDE 0.9% FLUSH: 3.0000 mL | Freq: Two times a day (BID) | INTRAVENOUS | Status: DC

## 2023-10-26 MED ORDER — CHLORHEXIDINE GLUCONATE CLOTH 2 % EX PADS
6.0000 | MEDICATED_PAD | Freq: Every day | CUTANEOUS | Status: DC
Start: 2023-10-26 — End: 2023-10-28
  Administered 2023-10-26 – 2023-10-28 (×3): 6 via TOPICAL

## 2023-10-26 MED ORDER — SODIUM CHLORIDE 0.9 % IV SOLN
80.0000 mg | INTRAVENOUS | Status: AC
  Administered 2023-10-26: 80 mg
  Filled 2023-10-26: qty 2

## 2023-10-26 MED ORDER — LIDOCAINE HCL (PF) 1 % IJ SOLN
INTRAMUSCULAR | Status: DC | PRN
  Administered 2023-10-26: 60 mL

## 2023-10-26 MED ORDER — SODIUM CHLORIDE 0.9 % IV SOLN: INTRAVENOUS | Status: DC

## 2023-10-26 MED ORDER — SODIUM CHLORIDE 0.9% FLUSH: 3.0000 mL | INTRAVENOUS | Status: DC | PRN

## 2023-10-26 MED ORDER — LIDOCAINE HCL (PF) 1 % IJ SOLN
INTRAMUSCULAR | Status: AC
  Filled 2023-10-26: qty 60

## 2023-10-26 MED ORDER — CHLORHEXIDINE GLUCONATE 4 % EX SOLN
60.0000 mL | Freq: Once | CUTANEOUS | Status: AC
  Administered 2023-10-26: 4 via TOPICAL

## 2023-10-26 MED ORDER — MUPIROCIN 2 % EX OINT
1.0000 | TOPICAL_OINTMENT | Freq: Two times a day (BID) | CUTANEOUS | Status: DC
  Administered 2023-10-26 – 2023-10-28 (×4): 1 via NASAL
  Filled 2023-10-26: qty 22

## 2023-10-26 MED ORDER — ONDANSETRON HCL 4 MG/2ML IJ SOLN: 4.0000 mg | Freq: Four times a day (QID) | INTRAMUSCULAR | Status: DC | PRN

## 2023-10-26 MED ORDER — CHLORHEXIDINE GLUCONATE 4 % EX SOLN
60.0000 mL | Freq: Once | CUTANEOUS | Status: AC
  Administered 2023-10-26: 4 via TOPICAL
  Filled 2023-10-26: qty 60

## 2023-10-26 MED ORDER — CHLORHEXIDINE GLUCONATE CLOTH 2 % EX PADS
6.0000 | MEDICATED_PAD | Freq: Every day | CUTANEOUS | Status: DC
  Administered 2023-10-26 – 2023-10-28 (×3): 6 via TOPICAL

## 2023-10-26 SURGICAL SUPPLY — 13 items
CABLE SURGICAL S-101-97-12 (CABLE) ×2 IMPLANT
CATH RIGHTSITE C315HIS02 (CATHETERS) IMPLANT
ELECT DEFIB PAD ADLT CADENCE (PAD) IMPLANT
IPG PACE AZUR XT DR MRI W1DR01 (Pacemaker) IMPLANT
LEAD CAPSURE NOVUS 5076-52CM (Lead) IMPLANT
LEAD SELECT SECURE 3830 383069 (Lead) IMPLANT
PACE AZURE XT DR MRI W1DR01 (Pacemaker) ×1 IMPLANT
PAD DEFIB RADIO PHYSIO CONN (PAD) ×2 IMPLANT
SELECT SECURE 3830 383069 (Lead) ×1 IMPLANT
SHEATH 7FR PRELUDE SNAP 13 (SHEATH) IMPLANT
SLITTER 6232ADJ (MISCELLANEOUS) IMPLANT
TRAY PACEMAKER INSERTION (PACKS) ×2 IMPLANT
WIRE HI TORQ VERSACORE-J 145CM (WIRE) IMPLANT

## 2023-10-26 NOTE — Progress Notes (Signed)
 Advanced Heart Failure Rounding Note  Cardiologist: None  Chief Complaint:  Subjective:    TPM placed 10/25/23  NSR in 70s with some NSVT overnight. No pauses noted on tele review.   Feels fine this morning. Denies CP/SOB.   Objective:   Weight Range: 70.1 kg Body mass index is 22.17 kg/m.   Vital Signs:   Temp:  [96.6 F (35.9 C)-98.2 F (36.8 C)] 97.9 F (36.6 C) (02/25 0900) Pulse Rate:  [68-82] 79 (02/25 0900) Resp:  [12-26] 20 (02/25 0900) BP: (124-153)/(76-100) 131/84 (02/25 0900) SpO2:  [92 %-100 %] 99 % (02/25 0900) Weight:  [70.1 kg-72.5 kg] 70.1 kg (02/25 0700) Last BM Date : 10/25/23  Weight change: Filed Weights   10/25/23 1400 10/26/23 0700  Weight: 72.5 kg 70.1 kg    Intake/Output:   Intake/Output Summary (Last 24 hours) at 10/26/2023 0942 Last data filed at 10/26/2023 0900 Gross per 24 hour  Intake 167.91 ml  Output 1025 ml  Net -857.09 ml      Physical Exam  General:  well appearing.  No respiratory difficulty HEENT: normal Neck: supple. JVD flat. RIJ TPM Cor: PMI nondisplaced. Regular rate & rhythm. No rubs, gallops or murmurs. Lungs: clear Abdomen: soft, nontender, nondistended. Good bowel sounds. Extremities: no cyanosis, clubbing, rash, edema  Neuro: alert & oriented x 3. Moves all 4 extremities w/o difficulty. Affect pleasant.   Telemetry   NSR 70s. Some NSVT overnight, longest 11 beats. (Personally reviewed)    EKG    No new EKG to review  Labs    CBC Recent Labs    10/25/23 1430  WBC 5.4  NEUTROABS 4.4  HGB 10.6*  HCT 32.9*  MCV 89.6  PLT 195   Basic Metabolic Panel Recent Labs    91/47/82 1430 10/26/23 0231  NA 140 141  K 4.6 4.5  CL 108 110  CO2 21* 20*  GLUCOSE 100* 95  BUN 22 20  CREATININE 1.60* 1.50*  CALCIUM 9.9 9.9  MG 1.7 2.1   Liver Function Tests Recent Labs    10/25/23 1430  AST 40  ALT 39  ALKPHOS 108  BILITOT 0.4  PROT 7.1  ALBUMIN 2.7*   No results for input(s): "LIPASE",  "AMYLASE" in the last 72 hours. Cardiac Enzymes No results for input(s): "CKTOTAL", "CKMB", "CKMBINDEX", "TROPONINI" in the last 72 hours.  BNP: BNP (last 3 results) Recent Labs    10/25/23 1425  BNP 2,096.6*    ProBNP (last 3 results) No results for input(s): "PROBNP" in the last 8760 hours.   D-Dimer No results for input(s): "DDIMER" in the last 72 hours. Hemoglobin A1C No results for input(s): "HGBA1C" in the last 72 hours. Fasting Lipid Panel Recent Labs    10/25/23 1427  CHOL 141  HDL 36*  LDLCALC 87  TRIG 89  CHOLHDL 3.9   Thyroid Function Tests Recent Labs    10/25/23 1425  TSH 1.210    Other results:   Imaging    CARDIAC CATHETERIZATION Result Date: 10/25/2023 Successful TVP placement.     Medications:     Scheduled Medications:  aspirin EC  81 mg Oral Daily   chlorhexidine  60 mL Topical Once   chlorhexidine  60 mL Topical Once   Chlorhexidine Gluconate Cloth  6 each Topical Daily   ferrous sulfate  325 mg Oral Q breakfast   gentamicin (GARAMYCIN) 80 mg in sodium chloride 0.9 % 500 mL irrigation  80 mg Irrigation On Call  nicotine  14 mg Transdermal Daily   rosuvastatin  10 mg Oral Daily   sodium chloride flush  3 mL Intravenous Q12H    Infusions:  sodium chloride 10 mL/hr at 10/26/23 0900   sodium chloride      ceFAZolin (ANCEF) IV      PRN Medications: sodium chloride, acetaminophen, sodium chloride flush    Patient Profile   Isiac Breighner is a 68 y.o. AAM with chronic HFrEF, CAD (CTO of RCA, prior LAD stent 07), paroxysmal atrial fibrillation (on ASA, failed Xarelto with hemoptysis and anemia), HTN and HLD. Admitted with Syncope.   Assessment/Plan   Syncope with prolonged pauses - admitted with multiple syncopal episodes - required brief CPR at [facility]. Tele review with prolonged (mostly) nocturnal pauses in chart. - Stable on tele today, just with some NSVT - EP following. Plan for PM placement. - Now with  temp wire in place.  - UDS + cocaine - Keep K>4, Mg >2 - Will need outpatient sleep study   A/C HFrEF, iCM - [place] echo 2/25 showed EF 30-35%, mild-mod MR, mild-mod TR.  - NYHA IV on admission - Appears euvolemic - A1c 5.9. Can continue Farxiga 10 mg daily - Would avoid BB with cocaine use - Documented ACE-I allergy. Does not remember swelling with Ace-I. Can consider re-trial - Start spiro 12.5 mg daily - POC lactic acid 1.6 - update echo here - Strict I&O, daily weights - Not a candidate for advanced therapies with polysubstance abuse and hx noncompliance.    CAD - Had cath here in 2007 (not available in chart) - HsTrops (-) - Denies CP - Continue ASA - check lipid panel. Continue statin   HTN - Will follow for now   HLD - LDL 87, continue statin   Cocaine abuse - reports every other day use - cessation advised - will consult TOC for resources   ETOH use - intermittently drinks - cessation advised   Tobacco abuse - smokes 1/2 PPD - cessation advised   PVD - Followed with Dr. Hetty Blend - s/p [facility] angioplasty of the left iliac system, 2018  CRITICAL CARE Performed by: Alen Bleacher  Total critical care time: 15 minutes  Critical care time was exclusive of separately billable procedures and treating other patients.  Critical care was necessary to treat or prevent imminent or life-threatening deterioration.  Critical care was time spent personally by me on the following activities: development of treatment plan with patient and/or surrogate as well as nursing, discussions with consultants, evaluation of patient's response to treatment, examination of patient, obtaining history from patient or surrogate, ordering and performing treatments and interventions, ordering and review of laboratory studies, ordering and review of radiographic studies, pulse oximetry and re-evaluation of patient's condition.   Length of Stay: 1  Alen Bleacher, NP  10/26/2023, 9:42  AM  Advanced Heart Failure Team Pager [phone] (M-F; 7a - 5p)  Please contact [facility] for night-coverage after hours (5p -7a ) and weekends on [email or web address]

## 2023-10-26 NOTE — Progress Notes (Addendum)
 Patient Name: Austin Espinoza Date of Encounter: 10/26/2023  Primary Cardiologist: None Electrophysiologist: None  Interval Summary   The patient is doing well today.  At this time, the patient denies chest pain, shortness of breath, or any new concerns.  Vital Signs    Vitals:   10/26/23 0600 10/26/23 0700 10/26/23 0800 10/26/23 0900  BP: (!) 151/82 (!) 139/94 134/78 131/84  Pulse: 79 81 69 79  Resp: 19 (!) 21 18 20   Temp: 98.2 F (36.8 C) 98.1 F (36.7 C) 97.7 F (36.5 C) 97.9 F (36.6 C)  TempSrc:      SpO2: 100% 100% 100% 99%  Weight:  70.1 kg    Height:        Intake/Output Summary (Last 24 hours) at 10/26/2023 0929 Last data filed at 10/26/2023 0900 Gross per 24 hour  Intake 167.91 ml  Output 1025 ml  Net -857.09 ml   Filed Weights   10/25/23 1400 10/26/23 0700  Weight: 72.5 kg 70.1 kg    Physical Exam    GEN- The patient is well appearing, alert and oriented x 3 today.   Lungs- Clear to ausculation bilaterally, normal work of breathing Cardiac- Regular rate and rhythm, no murmurs, rubs or gallops GI- soft, NT, ND, + BS Extremities- no clubbing or cyanosis. No edema  Telemetry    NSR 60-80s (personally reviewed)  Hospital Course    Austin Espinoza is a 68 y.o. male with a history of HFrEF, CAD (CTO of RCA, prior LAD stent), PAF on ASA, OFF OAC with hemoptysis and anemia, polysubstance abuse, HTN, and HLD who is being seen today for the evaluation of syncope and pauses at the request of Dr. Gala Romney   Assessment & Plan    Syncope Sinus arrest with ventricular standstill Unclear what is driving this with no obvious conduction disease at baseline despite CAD h/o With significant pause, recommend temp wire today and follow course.  Explained risks, benefits, and alternatives to PPM implantation, including but not limited to bleeding, infection, pneumothorax, pericardial effusion, lead dislodgement, heart attack, stroke, or death.  Pt verbalized  understanding and agrees to proceed.    Not on OAC  Acute on chronic CHF ICM EF 30-35%, mild mod MR, mild mod TR at [place] Per HF team appears euvolemic.  Not candidate for advanced therapies with polysubstance abuse and hx non-compliance.    Cocaine abuse ETOH abuse UDS+ cocaine Reports having stopped ETOH abuse for several months.    Hypothermia PNA Low threshold to include CCM.    CAD Tobacco abuse PVD Lipids pending Most recent stent appears 2016 by chart  Dr. Ladona Ridgel has seen and plan for pacing today.   For questions or updates, please contact [facility] Please consult [email or web address] for contact info under Cardiology/STEMI.  Signed, Graciella Freer, PA-C  10/26/2023, 9:29 AM    EP Attending  Patient seen and examined. Agree with above. Discussed with our EP team. He feels well today s/p insertion of a temp pacing lead. He has had some but not much pacing. He notes that he has had at least 10 episodes of syncope over the last six months, all the same, sudden and lasting seconds. No injuries. On exam he is a stable appearing man who looks older than his stated age. Cv with a RRR and lungs with decreased breath sounds. Ext with no edema. Neuro is non-focal. Tele with nsr and occasional ventricular pacing. Echo reviewed. A/P Sinus node dysfunction - the patient has sinus node dysfunction  which is probably not related to cocaine use. I have recommended insertion of a DDD PM.  Chronic systolic heart failure - He is not a candidate for ICD at this point with cocaine use. If after GDMT and cessation of cocaine his EF does not improve, we could consider ICD upgrade.   Sharlot Gowda Finch Costanzo,MD

## 2023-10-26 NOTE — Plan of Care (Signed)
   Problem: Activity: Goal: Risk for activity intolerance will decrease Outcome: Progressing   Problem: Nutrition: Goal: Adequate nutrition will be maintained Outcome: Progressing   Problem: Pain Managment: Goal: General experience of comfort will improve and/or be controlled Outcome: Progressing   Problem: Safety: Goal: Ability to remain free from injury will improve Outcome: Progressing

## 2023-10-26 NOTE — Progress Notes (Signed)
 Echocardiogram 2D Echocardiogram has been performed.  Warren Lacy Meilani Edmundson RDCS 10/26/2023, 10:14 AM

## 2023-10-26 NOTE — Plan of Care (Signed)
  Problem: Coping: Goal: Level of anxiety will decrease Outcome: Progressing   Problem: Elimination: Goal: Will not experience complications related to bowel motility Outcome: Completed/Met

## 2023-10-27 ENCOUNTER — Other Ambulatory Visit (HOSPITAL_COMMUNITY): Payer: Self-pay

## 2023-10-27 ENCOUNTER — Telehealth (HOSPITAL_COMMUNITY): Payer: Self-pay | Admitting: Pharmacy Technician

## 2023-10-27 ENCOUNTER — Telehealth: Payer: Self-pay

## 2023-10-27 ENCOUNTER — Inpatient Hospital Stay (HOSPITAL_COMMUNITY): Payer: 59

## 2023-10-27 ENCOUNTER — Other Ambulatory Visit: Payer: Self-pay

## 2023-10-27 DIAGNOSIS — F1910 Other psychoactive substance abuse, uncomplicated: Secondary | ICD-10-CM

## 2023-10-27 DIAGNOSIS — D649 Anemia, unspecified: Secondary | ICD-10-CM | POA: Insufficient documentation

## 2023-10-27 DIAGNOSIS — I5022 Chronic systolic (congestive) heart failure: Secondary | ICD-10-CM | POA: Diagnosis not present

## 2023-10-27 DIAGNOSIS — I495 Sick sinus syndrome: Secondary | ICD-10-CM | POA: Insufficient documentation

## 2023-10-27 DIAGNOSIS — I255 Ischemic cardiomyopathy: Secondary | ICD-10-CM

## 2023-10-27 DIAGNOSIS — I480 Paroxysmal atrial fibrillation: Secondary | ICD-10-CM | POA: Insufficient documentation

## 2023-10-27 DIAGNOSIS — E785 Hyperlipidemia, unspecified: Secondary | ICD-10-CM

## 2023-10-27 HISTORY — DX: Sick sinus syndrome: I49.5

## 2023-10-27 HISTORY — DX: Other psychoactive substance abuse, uncomplicated: F19.10

## 2023-10-27 HISTORY — DX: Paroxysmal atrial fibrillation: I48.0

## 2023-10-27 HISTORY — DX: Chronic systolic (congestive) heart failure: I50.22

## 2023-10-27 HISTORY — DX: Ischemic cardiomyopathy: I25.5

## 2023-10-27 LAB — BASIC METABOLIC PANEL
Anion gap: 12 (ref 5–15)
BUN: 21 mg/dL (ref 8–23)
CO2: 20 mmol/L — ABNORMAL LOW (ref 22–32)
Calcium: 9.8 mg/dL (ref 8.9–10.3)
Chloride: 107 mmol/L (ref 98–111)
Creatinine, Ser: 1.36 mg/dL — ABNORMAL HIGH (ref 0.61–1.24)
GFR, Estimated: 56 mL/min — ABNORMAL LOW (ref >60)
Glucose, Bld: 100 mg/dL — ABNORMAL HIGH (ref 70–99)
Potassium: 4.3 mmol/L (ref 3.5–5.1)
Sodium: 139 mmol/L (ref 135–145)

## 2023-10-27 LAB — MAGNESIUM: Magnesium: 1.7 mg/dL (ref 1.7–2.4)

## 2023-10-27 MED ORDER — DAPAGLIFLOZIN PROPANEDIOL 10 MG PO TABS
10.0000 mg | ORAL_TABLET | Freq: Every day | ORAL | Status: DC
  Administered 2023-10-27 – 2023-10-28 (×2): 10 mg via ORAL
  Filled 2023-10-27 (×2): qty 1

## 2023-10-27 MED ORDER — SPIRONOLACTONE 12.5 MG HALF TABLET
12.5000 mg | ORAL_TABLET | Freq: Every day | ORAL | Status: DC
  Administered 2023-10-27 – 2023-10-28 (×2): 12.5 mg via ORAL
  Filled 2023-10-27 (×2): qty 1

## 2023-10-27 MED ORDER — MAGNESIUM SULFATE 4 GM/100ML IV SOLN
4.0000 g | Freq: Once | INTRAVENOUS | Status: AC
  Administered 2023-10-27: 4 g via INTRAVENOUS
  Filled 2023-10-27: qty 100

## 2023-10-27 MED ORDER — MAGNESIUM SULFATE 2 GM/50ML IV SOLN
2.0000 g | Freq: Once | INTRAVENOUS | Status: DC
Start: 2023-10-27 — End: 2023-10-27

## 2023-10-27 MED ORDER — CARVEDILOL 3.125 MG PO TABS
3.1250 mg | ORAL_TABLET | Freq: Two times a day (BID) | ORAL | Status: DC
  Administered 2023-10-27 – 2023-10-28 (×2): 3.125 mg via ORAL
  Filled 2023-10-27 (×2): qty 1

## 2023-10-27 MED FILL — Gentamicin Sulfate Inj 40 MG/ML: INTRAMUSCULAR | Qty: 80 | Status: AC

## 2023-10-27 NOTE — Progress Notes (Addendum)
 Rounding Note    Patient Name: Austin Espinoza Date of Encounter: 10/27/2023  [facility] Cardiologist: None   Subjective   No acute overnight events. No shortness of breath, chest pain, lightheadedness/ dizziness this admission.  Inpatient Medications    Scheduled Meds:  aspirin EC  81 mg Oral Daily   Chlorhexidine Gluconate Cloth  6 each Topical Daily   Chlorhexidine Gluconate Cloth  6 each Topical Daily   ferrous sulfate  325 mg Oral Q breakfast   mupirocin ointment  1 Application Nasal BID   nicotine  14 mg Transdermal Daily   rosuvastatin  10 mg Oral Daily   sodium chloride flush  3 mL Intravenous Q12H   Continuous Infusions:  sodium chloride 10 mL/hr at 10/26/23 1900   PRN Meds: acetaminophen, ondansetron (ZOFRAN) IV, sodium chloride flush   Vital Signs    Vitals:   10/27/23 0350 10/27/23 0804 10/27/23 0928 10/27/23 1239  BP: (!) 138/94 (!) 143/94  (!) 157/93  Pulse: 82 70 75 81  Resp: 18 20 16 19   Temp: (!) 97.5 F (36.4 C) (!) 97.3 F (36.3 C)  (!) 97.3 F (36.3 C)  TempSrc: Oral Oral  Oral  SpO2: 100% 99% 96% 95%  Weight: 66 kg     Height:        Intake/Output Summary (Last 24 hours) at 10/27/2023 1426 Last data filed at 10/27/2023 1425 Gross per 24 hour  Intake 944.94 ml  Output 975 ml  Net -30.06 ml      10/27/2023    3:50 AM 10/26/2023    9:21 PM 10/26/2023    7:00 AM  Last 3 Weights  Weight (lbs) 145 lb 8.1 oz 147 lb 11.3 oz 154 lb 8.7 oz  Weight (kg) 66 kg 67 kg 70.1 kg      Telemetry    Normal sinus rhythm with intermittent atrial pacing. Occasional PVCs and short runs of NSVT. - Personally Reviewed  ECG    Atrial paced rhythm with LVH. - Personally Reviewed  Physical Exam   General: 68 y.o. African-American male resting comfortably in no acute distress. HEENT: Normocephalic and atraumatic. Sclera clear. EOMs intact. Neck: Supple. No JVD. Heart: RRR. Distinct S1 and S2. No murmurs, gallops, or rubs.  Lungs: No  increased work of breathing. Decreased breath sounds noted bilaterally.  No wheezes, rhonchi, or rales.  Abdomen: Soft, non-distended, and non-tender to palpation.  Extremities: No lower extremity edema.    Skin: Warm and dry. Neuro: No focal deficits. Psych: Normal affect. Responds appropriately.  Labs    High Sensitivity Troponin:  No results for input(s): "TROPONINIHS" in the last 720 hours.   Chemistry Recent Labs  Lab 10/25/23 1430 10/26/23 0231 10/27/23 0231  NA 140 141 139  K 4.6 4.5 4.3  CL 108 110 107  CO2 21* 20* 20*  GLUCOSE 100* 95 100*  BUN 22 20 21   CREATININE 1.60* 1.50* 1.36*  CALCIUM 9.9 9.9 9.8  MG 1.7 2.1 1.7  PROT 7.1  --   --   ALBUMIN 2.7*  --   --   AST 40  --   --   ALT 39  --   --   ALKPHOS 108  --   --   BILITOT 0.4  --   --   GFRNONAA 46* 50* 56*  ANIONGAP 11 11 12     Lipids  Recent Labs  Lab 10/25/23 1427  CHOL 141  TRIG 89  HDL 36*  LDLCALC 87  CHOLHDL 3.9    Hematology Recent Labs  Lab 10/25/23 1430 10/26/23 0231  WBC 5.4  --   RBC 3.67* 3.65*  HGB 10.6*  --   HCT 32.9*  --   MCV 89.6  --   MCH 28.9  --   MCHC 32.2  --   RDW 16.3*  --   PLT 195  --    Thyroid  Recent Labs  Lab 10/25/23 1425  TSH 1.210    BNP Recent Labs  Lab 10/25/23 1425  BNP 2,096.6*    DDimer No results for input(s): "DDIMER" in the last 168 hours.   Radiology    DG Chest 2 View Result Date: 10/27/2023 CLINICAL DATA:  Pacemaker placement. EXAM: CHEST - 2 VIEW COMPARISON:  March 20, 2021. FINDINGS: Stable cardiomediastinal silhouette. Left-sided pacemaker is noted with leads in grossly good position. No pneumothorax is noted. Increased interstitial densities are noted throughout both lungs concerning for pulmonary edema. Minimal bibasilar subsegmental atelectasis is noted with pleural effusions. Bony thorax is unremarkable. IMPRESSION: Left-sided pacemaker is noted in grossly good position. Probable bilateral pulmonary edema with minimal  bibasilar subsegmental atelectasis and pleural effusions. Electronically Signed   By: Lupita Raider M.D.   On: 10/27/2023 10:32   EP PPM/ICD IMPLANT Result Date: 10/26/2023 CONCLUSIONS:  1. Successful implantation of a Medtronic dual-chamber pacemaker for symptomatic bradycardia due to sinus node dysfunction  2. No early apparent complications.       Lewayne Bunting, MD 10/26/2023 4:23 PM   ECHOCARDIOGRAM COMPLETE Result Date: 10/26/2023    ECHOCARDIOGRAM REPORT   Patient Name:   Austin Espinoza Date of Exam: 10/26/2023 Medical Rec #:  [id number]       Height:       70.0 in Accession #:    [id number]      Weight:       154.5 lb Date of Birth:  [birth year]       BSA:          1.871 m Patient Age:    68 years        BP:           131/84 mmHg Patient Gender: M               HR:           77 bpm. Exam Location:  Inpatient Procedure: 2D Echo, Color Doppler and Cardiac Doppler (Both Spectral and Color            Flow Doppler were utilized during procedure). Indications:    I50.21 Acute systolic (congestive) heart failure  History:        Patient has prior history of Echocardiogram examinations, most                 recent 12/29/2017. CAD; Risk Factors:Hypertension, Diabetes,                 Dyslipidemia and Polysubstance Abuse.  Sonographer:    Irving Burton Senior RDCS Referring Phys: [id number] ALMA L DIAZ IMPRESSIONS  1. Left ventricular ejection fraction, by estimation, is 25 to 30%. The left ventricle has severely decreased function. The left ventricle demonstrates global hypokinesis. The left ventricular internal cavity size was mildly dilated. Left ventricular diastolic parameters are consistent with Grade III diastolic dysfunction (restrictive). Elevated left atrial pressure.  2. Right ventricular systolic function is moderately reduced. The right ventricular size is mildly enlarged. There is moderately elevated pulmonary artery systolic pressure.  3. Left atrial size was mildly  dilated.  4. Right atrial size was mildly  dilated.  5. The mitral valve is normal in structure. Moderate mitral valve regurgitation.  6. Tricuspid valve regurgitation is mild to moderate.  7. The aortic valve is tricuspid. Aortic valve regurgitation is not visualized.  8. The inferior vena cava is dilated in size with >50% respiratory variability, suggesting right atrial pressure of 8 mmHg. FINDINGS  Left Ventricle: Left ventricular ejection fraction, by estimation, is 25 to 30%. The left ventricle has severely decreased function. The left ventricle demonstrates global hypokinesis. The left ventricular internal cavity size was mildly dilated. There is no left ventricular hypertrophy. Left ventricular diastolic parameters are consistent with Grade III diastolic dysfunction (restrictive). Elevated left atrial pressure. Right Ventricle: The right ventricular size is mildly enlarged. No increase in right ventricular wall thickness. Right ventricular systolic function is moderately reduced. There is moderately elevated pulmonary artery systolic pressure. The tricuspid regurgitant velocity is 3.38 m/s, and with an assumed right atrial pressure of 8 mmHg, the estimated right ventricular systolic pressure is 53.7 mmHg. Left Atrium: Left atrial size was mildly dilated. Right Atrium: Right atrial size was mildly dilated. Pericardium: There is no evidence of pericardial effusion. Mitral Valve: The mitral valve is normal in structure. Moderate mitral valve regurgitation. Tricuspid Valve: The tricuspid valve is normal in structure. Tricuspid valve regurgitation is mild to moderate. Aortic Valve: The aortic valve is tricuspid. Aortic valve regurgitation is not visualized. Pulmonic Valve: The pulmonic valve was normal in structure. Pulmonic valve regurgitation is trivial. Aorta: The aortic root and ascending aorta are structurally normal, with no evidence of dilitation. Venous: The inferior vena cava is dilated in size with greater than 50% respiratory variability,  suggesting right atrial pressure of 8 mmHg. IAS/Shunts: No atrial level shunt detected by color flow Doppler. Additional Comments: 3D imaging was not performed.  LEFT VENTRICLE PLAX 2D LVIDd:         5.70 cm      Diastology LVIDs:         5.50 cm      LV e' medial:    4.13 cm/s LV PW:         0.70 cm      LV E/e' medial:  29.5 LV IVS:        0.70 cm      LV e' lateral:   5.98 cm/s LVOT diam:     2.20 cm      LV E/e' lateral: 20.4 LV SV:         46 LV SV Index:   25 LVOT Area:     3.80 cm  LV Volumes (MOD) LV vol d, MOD A2C: 164.0 ml LV vol d, MOD A4C: 151.0 ml LV vol s, MOD A2C: 119.0 ml LV vol s, MOD A4C: 95.8 ml LV SV MOD A2C:     45.0 ml LV SV MOD A4C:     151.0 ml LV SV MOD BP:      48.3 ml RIGHT VENTRICLE RV S prime:     7.94 cm/s TAPSE (M-mode): 1.3 cm LEFT ATRIUM             Index        RIGHT ATRIUM           Index LA diam:        4.20 cm 2.25 cm/m   RA Area:     18.60 cm LA Vol (A2C):   68.0 ml 36.35 ml/m  RA Volume:   55.30  ml  29.56 ml/m LA Vol (A4C):   58.6 ml 31.33 ml/m LA Biplane Vol: 68.8 ml 36.78 ml/m  AORTIC VALVE LVOT Vmax:   76.30 cm/s LVOT Vmean:  50.100 cm/s LVOT VTI:    0.122 m  AORTA Ao Root diam: 2.80 cm Ao Asc diam:  3.40 cm MITRAL VALVE                  TRICUSPID VALVE MV Area (PHT): 7.16 cm       TR Peak grad:   45.7 mmHg MV Decel Time: 106 msec       TR Vmax:        338.00 cm/s MR Peak grad:    84.3 mmHg MR Mean grad:    57.0 mmHg    SHUNTS MR Vmax:         459.00 cm/s  Systemic VTI:  0.12 m MR Vmean:        366.0 cm/s   Systemic Diam: 2.20 cm MR PISA:         1.27 cm MR PISA Eff ROA: 11 mm MR PISA Radius:  0.45 cm MV E velocity: 122.00 cm/s MV A velocity: 41.30 cm/s MV E/A ratio:  2.95 Clearnce Hasten Electronically signed by Clearnce Hasten Signature Date/Time: 10/26/2023/10:16:26 AM    Final    CARDIAC CATHETERIZATION Result Date: 10/25/2023 Successful TVP placement.    Cardiac Studies   Echocardiogram 10/26/2023: Impressions: 1. Left ventricular ejection fraction, by  estimation, is 25 to 30%. The  left ventricle has severely decreased function. The left ventricle  demonstrates global hypokinesis. The left ventricular internal cavity size  was mildly dilated. Left ventricular  diastolic parameters are consistent with Grade III diastolic dysfunction  (restrictive). Elevated left atrial pressure.   2. Right ventricular systolic function is moderately reduced. The right  ventricular size is mildly enlarged. There is moderately elevated  pulmonary artery systolic pressure.   3. Left atrial size was mildly dilated.   4. Right atrial size was mildly dilated.   5. The mitral valve is normal in structure. Moderate mitral valve  regurgitation.   6. Tricuspid valve regurgitation is mild to moderate.   7. The aortic valve is tricuspid. Aortic valve regurgitation is not  visualized.   8. The inferior vena cava is dilated in size with >50% respiratory  variability, suggesting right atrial pressure of 8 mmHg.  _____________   Pacemaker Implant 10/26/2023: Conclusions: 1. Successful implantation of a Medtronic dual-chamber pacemaker for symptomatic bradycardia due to sinus node dysfunction  2. No early apparent complications.   Patient Profile     68 y.o. male with a history of CAD with NSTEMI in 01/2006 s/p BMS to LAD (cath also showed CTO of RCA at that time), chronic HFrEF with EF of 45-50% on last Echo in 2019, paroxysmal atrial fibrillation (not on anticoagulation), PAD s/p drug coated balloon angioplasty of left iliac system in 2018,  hypertension, hyperlipidemia, and polysubstance abuse who presented to [facility] for further evaluation of multiple syncopal episodes and was noted to have prolonged pauses with sinus arrest/ ventricular standstill. He was transferred to [facility] on 10/25/2023 for TPM.  Assessment & Plan    Syncope Sinus Node Dysfunction Patient Patient was admitted with multiple syncopal episodes over the last 2-3 weeks and was found  to have prolonged pauses with sinus arrest/ ventricular standstill at [facility] requiring very brief CPR. Echo at [facility] showed LVEF of 30-35%.  He was transferred to [facility] on 10/25/2023  and underwent successful TVP placement. Repeat Echo here at [facility] on 10/26/2023 showed LVEF of 25-30% with global hypokinesis. EP was consulted and patient underwent placement of PPM for sinus node dysfunction on 2/25. Chest x-ray today showed no evidence of pneumothorax and device stable. Of note, he was not felt to be a candidate for ICD at this time given cocaine use. However, if after GDMT and cessation of cocaine, his EF does not not improve could consider ICD upgrade per EP.   Chronic HFrEF Ischemic Cardiomyopathy BNP elevated at 5,650 at [facility] and was 2,096 her at [facility]. Echo at [facility] showed LVEF of 30-35%. Repeat Echo here at [facility] showed LVEF of 25-30% with global hypokinesis and grade 3 diastolic dysfunction, moderate RV dysfunction, moderate MR, and mild to moderate TR  - Chest x-ray today showed probable bilateral pulmonary edema with minimal bibasilar subsegmental atelectasis and pleural effusion. However, he looks euvolemic on exam.  - Will start Spironolactone 12.5mg  daily. - Can restart home Farxiga 10mg  daily. - He was on Coreg at home but this was held on admission due to prolonged pauses. Will discuss restarting this with MD.   CAD History of NSTEM in 01/2016 s/p BMS to LAD. LHC at that time also showed CTO of RCA which was treated medically. High-sensitivity troponin was negative at [place].  - No chest pain.  - Continue aspirin and statin.  Paroxysmal Atrial Fibrillation He has a history of paroxysmal atrial fibrillation but has not been on anticoagulation given hemoptysis and anemia on Xarelto in the past.  - Maintaining sinus rhythm.  - Will discuss adding back Coreg with MD as above. - Not on anticoagulation as above. Continue Aspirin 81mg   daily.  PAD History of drug coated balloon angioplasty of the left iliac system in 2018.  - Continue aspirin and statin as above. - Followed by Dr. Hetty Blend.  Hypertension BP mildly elevated. - Continue medications for CHF as above.  Hyperlipidemia Lipid panel this admission: Total Cholesterol 141, Triglycerides 89, HDL 36, LDL 87. LDL goal <70 given CAD.  - Will increase Crestor from 10mg  to 20mg  daily. - Will need repeat lipid panel in 6-8 weeks.  Anemia Patient has a history of chronic anemia. Hemoglobin 10.6. Most recently baseline in the 11 range. Iron was mildly low at 40 but ferritin was normal. Folate and Vitamin B12 normal. Retic count normal.  - Continue iron supplement (new this admission). - Consider outpatient GI evaluation.  Polysubstance Abuse Patient reported cocaine, alcohol, and tobacco abuse on admission. UDS was positive for cocaine at [facility] and he reported using this every day.  - Patient was counseled on the importance of complete cessation.  Deconditioning Notified by RN that patient had significant weakness when moving to bedside commode. Will ask PT to see.    For questions or updates, please contact [facility] Please consult [email or web address] for contact info under     Signed, Corrin Parker, PA-C  10/27/2023, 2:26 PM    History and all data above reviewed.  Patient examined.  I agree with the findings as above.   The patient denies any chest pain or SOB.   He is weak.  We did ambulate him in the hallway and he was not had difficulty per nursing.  The patient exam reveals COR:RRR  ,  Lungs: Clear  ,  Abd: Positive bowel sounds, no rebound no guarding, Ext No edema  .  All available labs, radiology testing, previous records reviewed. Agree  with documented assessment and plan.   Syncope:  Status post PPM.  We will arrange follow up in .  Polysubstance abuse: He plans to pursue abstinence.  Cardiomyopathy:  Plan to start spironolactone  and resume low dose Coreg.     Fayrene Fearing Jakaylee Sasaki  2:42 PM  10/27/2023

## 2023-10-27 NOTE — Discharge Instructions (Addendum)
 Medication Changes: - START Spironolactone 12.5mg  once daily. - START Ferrous sulfate (iron supplement) 325mg  daily. - INCREASE Rosuvastatin (Crestor) to 20mg  once daily. - DECREASE Carvedilol (Coreg) to 3.125mg  twice daily. - CONTINUE Farxiga 10mg  once daily. - STOP Amlodipine. - Please continue all other medications as listed elsewhere on discharge paperwork. _______________  Heart Failure Education: Weigh yourself EVERY morning after you go to the bathroom but before you eat or drink anything. Write this number down in a weight log/diary. If you gain 3 pounds overnight or 5 pounds in a week, call the office. Take your medicines as prescribed. If you have concerns about your medications, please call us before you stop taking them.  Eat low salt foods--Limit salt (sodium) to 2000 mg per day. This will help prevent your body from holding onto fluid. Read food labels as many processed foods have a lot of sodium, especially canned goods and prepackaged meats. If you would like some assistance choosing low sodium foods, we would be happy to set you up with a nutritionist. Limit all fluids for the day to less than 2 liters (64 ounces). Fluid includes all drinks, coffee, juice, ice chips, soup, jello, and all other liquids. Stay as active as you can everyday. Staying active will give you more energy and make your muscles stronger. Start with 5 minutes at a time and work your way up to 30 minutes a day. Break up your activities--do some in the morning and some in the afternoon. Start with 3 days per week and work your way up to 5 days as you can.  If you have chest pain, feel short of breath, dizzy, or lightheaded, STOP. If you don't feel better after a short rest, call 911. If you do feel better, call the office to let us know you have symptoms with exercise. _______________   After Your Pacemaker   You have a Medtronic Pacemaker  ACTIVITY Do not lift your arm above shoulder height for 1 week  after your procedure. After 7 days, you may progress as below.  You should remove your sling 24 hours after your procedure, unless otherwise instructed by your provider.     Wednesday November 03, 2023  Thursday November 04, 2023 Friday November 05, 2023 Saturday November 06, 2023   Do not lift, push, pull, or carry anything over 10 pounds with the affected arm until 6 weeks (Wednesday December 08, 2023 ) after your procedure.   You may drive AFTER your wound check, unless you have been told otherwise by your provider.   Ask your healthcare provider when you can go back to work   INCISION/Dressing If you are on a blood thinner such as Coumadin, Xarelto, Eliquis, Plavix, or Pradaxa please confirm with your provider when this should be resumed.   If large square, outer bandage is left in place, this can be removed after 24 hours from your procedure. Do not remove steri-strips or glue as below.   If a PRESSURE DRESSING (a bulky dressing that usually goes up over your shoulder) was applied or left in place, please follow instructions given by your provider on when to return to have this removed.   Monitor your Pacemaker site for redness, swelling, and drainage. Call the device clinic at [phone] if you experience these symptoms or fever/chills.  If your incision is sealed with Steri-strips or staples, you may shower 7 days after your procedure or when told by your provider. Do not remove the steri-strips or let the shower  hit directly on your site. You may wash around your site with soap and water.    If you were discharged in a sling, please do not wear this during the day more than 48 hours after your surgery unless otherwise instructed. This may increase the risk of stiffness and soreness in your shoulder.   Avoid lotions, ointments, or perfumes over your incision until it is well-healed.  You may use a hot tub or a pool AFTER your wound check appointment if the incision is completely closed.  Pacemaker  Alerts:  Some alerts are vibratory and others beep. These are NOT emergencies. Please call our office to let us know. If this occurs at night or on weekends, it can wait until the next business day. Send a remote transmission.  If your device is capable of reading fluid status (for heart failure), you will be offered monthly monitoring to review this with you.   DEVICE MANAGEMENT Remote monitoring is used to monitor your pacemaker from home. This monitoring is scheduled every 91 days by our office. It allows [place] to keep an eye on the functioning of your device to ensure it is working properly. You will routinely see your Electrophysiologist annually (more often if necessary).   You should receive your ID card for your new device in 4-8 weeks. Keep this card with you at all times once received. Consider wearing a medical alert bracelet or necklace.  Your Pacemaker may be MRI compatible. This will be discussed at your next office visit/wound check.  You should avoid contact with strong electric or magnetic fields.   Do not use amateur (ham) radio equipment or electric (arc) welding torches. MP3 player headphones with magnets should not be used. Some devices are safe to use if held at least 12 inches (30 cm) from your Pacemaker. These include power tools, lawn mowers, and speakers. If you are unsure if something is safe to use, ask your health care provider.  When using your cell phone, hold it to the ear that is on the opposite side from the Pacemaker. Do not leave your cell phone in a pocket over the Pacemaker.  You may safely use electric blankets, heating pads, computers, and microwave ovens.  Call the office right away if: You have chest pain. You feel more short of breath than you have felt before. You feel more light-headed than you have felt before. Your incision starts to open up.  This information is not intended to replace advice given to you by your health care provider. Make sure you  discuss any questions you have with your health care provider.

## 2023-10-27 NOTE — TOC Initial Note (Addendum)
 Transition of Care [facility]) - Initial/Assessment Note    Patient Details  Name: Austin Espinoza MRN: [id number] Date of Birth: 1956-05-19  Transition of Care [facility]) CM/SW Contact:    Leone Haven, RN Phone Number: 10/27/2023, 2:18 PM  Clinical Narrative:                 From home with brother, has PCP and insurance on file, states has no HH services in place at this time , has a cane at home.  States brother and sister will transport them home at [employer] and family is support system, states gets medications from [facility] in  [place].  Pta self ambulatory with cane. Await pt eval. Expected Discharge Plan: Home w Home Health Services Barriers to Discharge: Continued Medical Work up   Patient Goals and CMS Choice Patient states their goals for this hospitalization and ongoing recovery are:: return home   Choice offered to / list presented to : NA      Expected Discharge Plan and Services In-house Referral: NA Discharge Planning Services: CM Consult Post Acute Care Choice: NA Living arrangements for the past 2 months: Single Family Home                 DME Arranged: N/A DME Agency: NA                  Prior Living Arrangements/Services Living arrangements for the past 2 months: Single Family Home Lives with:: Siblings (Brother) Patient language and need for interpreter reviewed:: Yes Do you feel safe going back to the place where you live?: Yes      Need for Family Participation in Patient Care: Yes (Comment) Care giver support system in place?: Yes (comment) Current home services: DME (cane) Criminal Activity/Legal Involvement Pertinent to Current Situation/Hospitalization: No - Comment as needed  Activities of Daily Living   ADL Screening (condition at time of admission) Independently performs ADLs?: Yes (appropriate for developmental age) Is the patient deaf or have difficulty hearing?: No Does the patient have difficulty seeing, even when wearing  glasses/contacts?: No Does the patient have difficulty concentrating, remembering, or making decisions?: No  Permission Sought/Granted Permission sought to share information with : Case Manager Permission granted to share information with : Yes, Verbal Permission Granted              Emotional Assessment Appearance:: Appears stated age Attitude/Demeanor/Rapport: Engaged Affect (typically observed): Appropriate Orientation: : Oriented to Self, Oriented to Place, Oriented to  Time, Oriented to Situation Alcohol/Substance Use: Psych Involvement: No (comment)  Admission diagnosis:  Syncope and collapse [R55] Patient Active Problem List   Diagnosis Date Noted   Sinus node dysfunction (HCC) 10/27/2023   Chronic HFrEF (heart failure with reduced ejection fraction) (HCC) 10/27/2023   Ischemic cardiomyopathy 10/27/2023   Paroxysmal atrial fibrillation (HCC) 10/27/2023   Anemia 10/27/2023   Polysubstance abuse (HCC) 10/27/2023   Syncope and collapse 10/25/2023   Critical lower limb ischemia (HCC) 01/04/2015   Pre-operative cardiovascular examination 01/04/2015   Skin lesions, generalized 12/28/2012   PAD (peripheral artery disease) (HCC)    Claudication (HCC)    CAD (coronary artery disease)    Ejection fraction < 50%    Hyperlipidemia    Hypertension    Tobacco abuse    PCP:  Hortencia Conradi, NP Pharmacy:   [facility] [address] [place] - [address] [place] [address] Phone: [phone] Fax: [phone]  [facility] [id number] -  [place], [place] - [address] AT [facility] [address] [place] [address] Phone: [phone] Fax: [phone]  [facility] - [place], [place] - [address] [place] [address] Phone: [phone] Fax: [phone]  [facility] [address] [place] [address] Phone:  [phone] Fax: [phone]     Social Drivers of Health (SDOH) Social History: SDOH Screenings   Food Insecurity: No Food Insecurity (10/26/2023)  Housing: Low Risk  (10/26/2023)  Transportation Needs: No Transportation Needs (10/26/2023)  Utilities: Not At Risk (10/26/2023)  Social Connections: Patient Declined (10/27/2023)  Tobacco Use: High Risk (10/08/2023)   SDOH Interventions:     Readmission Risk Interventions    10/27/2023    2:15 PM  Readmission Risk Prevention Plan  Post Dischage Appt Complete  Medication Screening Complete  Transportation Screening Complete

## 2023-10-27 NOTE — Telephone Encounter (Signed)
 Patient Product/process development scientist completed.    The patient is insured through [employer] Patient has Medicare and is not eligible for a copay card, but may be able to apply for patient assistance or Medicare RX Payment Plan (Patient Must reach out to their plan, if eligible for payment plan), if available.    Ran test claim for Farxiga 10 mg and the current 30 day co-pay is $0.00.  Ran test claim for Jardiance 10 mg and the current 30 day co-pay is $0.00.  This test claim was processed through [facility]- copay amounts may vary at other pharmacies due to pharmacy/plan contracts, or as the patient moves through the different stages of their insurance plan.     Roland Earl, CPHT Pharmacy Technician III Certified Patient Advocate [facility] Patient Advocate Team Direct Number: [phone]  Fax: [phone]

## 2023-10-27 NOTE — Evaluation (Signed)
 Physical Therapy Evaluation Patient Details Name: Austin Espinoza MRN: [id number] DOB: 09-09-1955 Today's Date: 10/27/2023  History of Present Illness  Pt is 68 year old presented to [facility] on  2/24 from [place] for syncope with prolonged pauses. Underwent placement of pacer on 2/25. PMH - chf, CAD, HTN, PVD, current cocaine use  Clinical Impression  Pt admitted with above diagnosis and presents to PT with functional limitations due to deficits listed below (See PT problem list). Pt needs skilled PT to maximize independence and safety. Pt presents with decreased mobility after pacer placement. Uses cane typically but did better using walker for incr stability. Recommend return home with brother with HHPT and rolling walker.           If plan is discharge home, recommend the following: A little help with bathing/dressing/bathroom;Help with stairs or ramp for entrance;Assistance with cooking/housework;Assist for transportation   Can travel by private vehicle        Equipment Recommendations Rolling walker (2 wheels)  Recommendations for Other Services       Functional Status Assessment Patient has had a recent decline in their functional status and demonstrates the ability to make significant improvements in function in a reasonable and predictable amount of time.     Precautions / Restrictions Precautions Precautions: ICD/Pacemaker      Mobility  Bed Mobility               General bed mobility comments: Pt sitting EOB    Transfers Overall transfer level: Needs assistance Equipment used: Rolling walker (2 wheels), Straight cane, Rollator (4 wheels) Transfers: Sit to/from Stand Sit to Stand: Contact guard assist           General transfer comment: Assist for safety. Verbal cues for hand placement    Ambulation/Gait Ambulation/Gait assistance: Contact guard assist, Supervision Gait Distance (Feet): 125 Feet (x 2) Assistive device: Rolling walker (2 wheels),  Rollator (4 wheels), Straight cane Gait Pattern/deviations: Step-through pattern, Decreased stride length, Trunk flexed Gait velocity: decr Gait velocity interpretation: 1.31 - 2.62 ft/sec, indicative of limited community ambulator   General Gait Details: CGA when using cane and rollator and supervision with use of rolling walker  Stairs            Wheelchair Mobility     Tilt Bed    Modified Rankin (Stroke Patients Only)       Balance Overall balance assessment: Needs assistance Sitting-balance support: No upper extremity supported, Feet supported Sitting balance-Leahy Scale: Good     Standing balance support: No upper extremity supported, During functional activity Standing balance-Leahy Scale: Fair                               Pertinent Vitals/Pain Pain Assessment Pain Assessment: No/denies pain    Home Living Family/patient expects to be discharged to:: Private residence Living Arrangements: Other relatives (brothers) Available Help at Discharge: Family;Available PRN/intermittently Type of Home: House Home Access: Stairs to enter   [employer] of Steps: 2   Home Layout: One level Home Equipment: Cane - single point      Prior Function Prior Level of Function : Independent/Modified Independent             Mobility Comments: Uses single point cane       Extremity/Trunk Assessment   Upper Extremity Assessment Upper Extremity Assessment: LUE deficits/detail LUE Deficits / Details: Limited by pacer precaution    Lower Extremity Assessment Lower  Extremity Assessment: Generalized weakness       Communication   Communication Communication: No apparent difficulties    Cognition Arousal: Alert Behavior During Therapy: WFL for tasks assessed/performed   PT - Cognitive impairments: No apparent impairments                         Following commands: Intact       Cueing       General Comments General  comments (skin integrity, edema, etc.): VSS on RA    Exercises     Assessment/Plan    PT Assessment Patient needs continued PT services  PT Problem List Decreased strength;Decreased balance;Decreased mobility       PT Treatment Interventions DME instruction;Gait training;Stair training;Functional mobility training;Therapeutic activities;Therapeutic exercise;Balance training;Patient/family education    PT Goals (Current goals can be found in the Care Plan section)  Acute Rehab PT Goals Patient Stated Goal: go home PT Goal Formulation: With patient Time For Goal Achievement: 11/10/23 Potential to Achieve Goals: Good    Frequency Min 1X/week     Co-evaluation               AM-PAC PT "6 Clicks" Mobility  Outcome Measure Help needed turning from your back to your side while in a flat bed without using bedrails?: None Help needed moving from lying on your back to sitting on the side of a flat bed without using bedrails?: A Little Help needed moving to and from a bed to a chair (including a wheelchair)?: A Little Help needed standing up from a chair using your arms (e.g., wheelchair or bedside chair)?: A Little Help needed to walk in hospital room?: A Little Help needed climbing 3-5 steps with a railing? : A Little 6 Click Score: 19    End of Session Equipment Utilized During Treatment: Gait belt Activity Tolerance: Patient tolerated treatment well Patient left: in bed;with call bell/phone within reach;with family/visitor present (sitting EOB) Nurse Communication: Mobility status PT Visit Diagnosis: Unsteadiness on feet (R26.81);Muscle weakness (generalized) (M62.81)    Time: 1191-4782 PT Time Calculation (min) (ACUTE ONLY): 24 min   Charges:   PT Evaluation $PT Eval Moderate Complexity: 1 Mod PT Treatments $Gait Training: 8-22 mins PT General Charges $$ ACUTE PT VISIT: 1 Visit         [facility] PT Acute Rehabilitation Services Office  [phone]   Angelina Ok [facility] 10/27/2023, 3:15 PM

## 2023-10-27 NOTE — Discharge Summary (Incomplete)
 Discharge Summary    Patient ID: Austin Espinoza MRN: [id number]; DOB: 05/25/1956  Admit date: 10/25/2023 Discharge date: 10/28/2023  PCP:  Austin Conradi, NP   [facility] Providers Cardiologist:  None   {  Discharge Diagnoses    Principal Problem:   Syncope and collapse Active Problems:   CAD (coronary artery disease)   Hyperlipidemia   Hypertension   PAD (peripheral artery disease) (HCC)   Sinus node dysfunction (HCC)   Chronic HFrEF (heart failure with reduced ejection fraction) (HCC)   Ischemic cardiomyopathy   Paroxysmal atrial fibrillation (HCC)   Anemia   Polysubstance abuse (HCC)   Type 2 diabetes mellitus with complication, without long-term current use of insulin (HCC)   AKI (acute kidney injury) (HCC)    Diagnostic Studies/Procedures    Echocardiogram 10/26/2023: Impressions: 1. Left ventricular ejection fraction, by estimation, is 25 to 30%. The  left ventricle has severely decreased function. The left ventricle  demonstrates global hypokinesis. The left ventricular internal cavity size  was mildly dilated. Left ventricular  diastolic parameters are consistent with Grade III diastolic dysfunction  (restrictive). Elevated left atrial pressure.   2. Right ventricular systolic function is moderately reduced. The right  ventricular size is mildly enlarged. There is moderately elevated  pulmonary artery systolic pressure.   3. Left atrial size was mildly dilated.   4. Right atrial size was mildly dilated.   5. The mitral valve is normal in structure. Moderate mitral valve  regurgitation.   6. Tricuspid valve regurgitation is mild to moderate.   7. The aortic valve is tricuspid. Aortic valve regurgitation is not  visualized.   8. The inferior vena cava is dilated in size with >50% respiratory  variability, suggesting right atrial pressure of 8 mmHg.  _____________  Pacemaker Implant 10/26/2023: Conclusions: 1. Successful implantation of a  Medtronic dual-chamber pacemaker for symptomatic bradycardia due to sinus node dysfunction  2. No early apparent complications.    History of Present Illness     Austin Espinoza is a 68 y.o. male with a history of CAD with NSTEMI in 01/2006 s/p BMS to LAD (cath also showed CTO of RCA at that time), chronic HFrEF with EF of 45-50% on last Echo in 2019, paroxysmal atrial fibrillation (not on anticoagulation), PAD s/p drug coated balloon angioplasty of left iliac system in 2018,  hypertension, hyperlipidemia, and polysubstance abuse who was admitted on 10/25/2023 after presenting with multiple syncopal episodes.  Patient initially presented to [facility] for 2-3 weeks of intermittent syncope  but reported noted feeling well for the last 6 months. The morning of presentation he almost passed out again which is when his brother decided to call EMS. Patient was not sure what medications he was taking at home and admitted to missing doses. He reported using cocaine every other day, intermittent alcohol use, and smoking 1/2 pack per day.  Upon arrival to the ED, vitals were stable. EKG showed normal sinus rhythm. Labs showed: Nas 143, K 4.8, Cr 1.2, lactic acid 2.1, BNP 5,650, high-sensitivity troponin negative, hemoglobin A1c 5.9. Urine drug screen was positive for cocaine. Echo showed LVEF of 30-35% with mild to moderate MR and mild to moderate TR. During admission, he experienced prolonged pauses requiring very brief CPR (approximately 3-4 compressions). Patient was transferred to [facility] on 10/25/2023 for further for TPM. En route, patient was having PVCs and EMS reported that patient was very symptomatic. Upon arrival to [facility], patient was resting comfortably and denied any  chest pain or shortness of breath.  Hospital Course     Consultants: EP   Syncope Sinus Node Dysfunction Patient was admitted with multiple syncopal episodes over the last 2-3 weeks and was found to have prolonged pauses  with sinus arrest/ ventricular standstill at [facility] requiring very brief CPR. Echo at [facility] showed LVEF of 30-35%.  He was transferred to [facility] on 10/25/2023 and underwent successful TVP placement. Repeat Echo here at [facility] on 10/26/2023 showed LVEF of 25-30% with global hypokinesis. EP was consulted and patient underwent placement of PPM for sinus node dysfunction on 2/25. Chest x-ray today showed no evidence of pneumothorax and device stable. Of note, he was not felt to be a candidate for ICD at this time given cocaine use. However, if after GDMT and cessation of cocaine, his EF does not not improve could consider ICD upgrade per EP.  Chronic HFrEF Ischemic Cardiomyopathy BNP elevated at 5,650 at [place] and was 2,096 her at [facility]. Echo at [facility] showed LVEF of 30-35%. Repeat Echo here at [facility] showed LVEF of 25-30% with global hypokinesis and grade 3 diastolic dysfunction, moderate RV dysfunction, moderate MR, and mild to moderate TR. He has appeared euvolemic this admission and did not require any IV diuresis. He has a documented ACEi allergy so has not been started on ARB or ARNI. He was started on Spironolactone 12.5mg  daily. Will resume home Farxiga at discharge. Home Coreg was initially held in setting of prolonged pauses but was restarted on lower dose of 3.125mg  twice daily prior to discharge. He is not a candidate for advanced therapies given polysubstance abuse and history of non-compliance. Suspect drop in EF may be due to ongoing cocaine use. Discussed importance of complete cessation. Can consider repeat Echo as an outpatient if he is able to be compliant with medications and stay off cocaine. Recommend repeat BMET at follow-up next week.  CAD History of NSTEM in 01/2016 s/p BMS to LAD. LHC at that time also showed CTO of RCA which was treated medically. High-sensitivity troponin was negative at [place]. No chest pain this admission. Continue Aspirin 81mg   daily. Crestor was increased to 20mg  daily.  Paroxysmal Atrial Fibrillation He has a history of paroxysmal atrial fibrillation but has not been on anticoagulation given hemoptysis and anemia on Xarelto in the past (although patient has no memory of this). Maintaining sinus rhythm this admission with frequent PACs.  Home Coreg was initially held in setting of prolonged pauses but was restarted on lower dose of 3.125mg  twice daily prior to discharge. Continue Aspirin as above.  PAD History of drug coated balloon angioplasty of the left iliac system in 2018. Continue aspirin and statin as above. Followed by Dr. Hetty Blend.   Hypertension BP mildly elevated at times but well controlled on day of discharge. He wa started on Spironolactone 12.5mg  daily. Home Coreg was decreased to 3.125mg  twice daily. Will hold home Amlodipine 2.5mg  daily for now. Can consider re-starting at outpatient follow-up if more BP control is needed but would prefer to optimize GDMT for CHF first.  Hyperlipidemia Lipid panel this admission: Total Cholesterol 141, Triglycerides 89, HDL 36, LDL 87. LDL goal <70 given CAD. Will increase Crestor from 10mg  to 20mg  daily. Will need lipid panel and LFTs in 6-8 weeks.  Type 2 Diabetes Mellitus Hemoglobin A1c 5.9% at [facility]. Can resume home Metformin as discharge.  AKI Baseline creatinine around 1.0 to 1.2. Creatinine peaked at 1.60 on arrival to [facility] but  improved back to 1.27 on day of discharge. Recommend repeat BMET at follow-up visit next week.  Anemia Patient has a history of chronic anemia. Most recent baseline in the 11 range. BP around 10.8 at discharge. Iron was mildly low at 40 but ferritin was normal. Folate and Vitamin B12 normal. Retic count normal. He was started on iron supplement which can be continued at discharge. Recommend repeat CBC at follow-up next week. Consider outpatient GI evaluation.   Polysubstance Abuse Patient reported cocaine, alcohol,  and tobacco abuse on admission. UDS was positive for cocaine at [facility] and he reported using this every day. Patient was counseled on the importance of complete cessation. Will provide Nicotine patch at discharge.  Deconditioning PT recommended home health PT and rolling walker at discharge. Orders placed.  Patient seen and examined by Dr. Antoine Poche today and determined to be stable for discharge. Outpatient follow-up arranged. Medications as below.     Did the patient have an acute coronary syndrome (MI, NSTEMI, STEMI, etc) this admission?:  No                               Did the patient have a percutaneous coronary intervention (stent / angioplasty)?:  No.    The patient will be scheduled for a TOC follow up appointment in 14 days.  A message has been sent to the [facility] and Scheduling Pool at the office where the patient should be seen for follow up.  _____________  Discharge Vitals Blood pressure 117/75, pulse 81, temperature 97.6 F (36.4 C), temperature source Oral, resp. rate 17, height 5\' 10"  (1.778 m), weight 68.1 kg, SpO2 97%.  Filed Weights   10/26/23 2121 10/27/23 0350 10/28/23 0016  Weight: 67 kg 66 kg 68.1 kg   General: 68 y.o. thin African-American male resting comfortably in no acute distress. HEENT: Normocephalic and atraumatic. Sclera clear. EOMs intact. Neck: Supple. No JVD. Heart: RRR. Distinct S1 and S2. No murmurs, gallops, or rubs.  Lungs: No increased work of breathing. Decreased breath sounds noted bilaterally.  No wheezes, rhonchi, or rales.  Abdomen: Soft, non-distended, and non-tender to palpation.  Extremities: No lower extremity edema.    Skin: Warm and dry. Neuro: No focal deficits. Psych: Normal affect. Responds appropriately.  Labs & Radiologic Studies    CBC Recent Labs    10/25/23 1430 10/28/23 0246  WBC 5.4 7.1  NEUTROABS 4.4  --   HGB 10.6* 10.8*  HCT 32.9* 33.0*  MCV 89.6 87.8  PLT 195 181   Basic Metabolic Panel Recent Labs     10/27/23 0231 10/28/23 0246  NA 139 139  K 4.3 3.9  CL 107 107  CO2 20* 22  GLUCOSE 100* 83  BUN 21 18  CREATININE 1.36* 1.27*  CALCIUM 9.8 9.9  MG 1.7 2.3   Liver Function Tests Recent Labs    10/25/23 1430  AST 40  ALT 39  ALKPHOS 108  BILITOT 0.4  PROT 7.1  ALBUMIN 2.7*   No results for input(s): "LIPASE", "AMYLASE" in the last 72 hours. High Sensitivity Troponin:   No results for input(s): "TROPONINIHS" in the last 720 hours.  BNP Invalid input(s): "POCBNP" D-Dimer No results for input(s): "DDIMER" in the last 72 hours. Hemoglobin A1C No results for input(s): "HGBA1C" in the last 72 hours. Fasting Lipid Panel Recent Labs    10/25/23 1427  CHOL 141  HDL 36*  LDLCALC 87  TRIG 89  CHOLHDL 3.9   Thyroid Function Tests Recent Labs    10/25/23 1425  TSH 1.210   _____________  DG Chest 2 View Result Date: 10/27/2023 CLINICAL DATA:  Pacemaker placement. EXAM: CHEST - 2 VIEW COMPARISON:  March 20, 2021. FINDINGS: Stable cardiomediastinal silhouette. Left-sided pacemaker is noted with leads in grossly good position. No pneumothorax is noted. Increased interstitial densities are noted throughout both lungs concerning for pulmonary edema. Minimal bibasilar subsegmental atelectasis is noted with pleural effusions. Bony thorax is unremarkable. IMPRESSION: Left-sided pacemaker is noted in grossly good position. Probable bilateral pulmonary edema with minimal bibasilar subsegmental atelectasis and pleural effusions. Electronically Signed   By: Lupita Raider M.D.   On: 10/27/2023 10:32   EP PPM/ICD IMPLANT Result Date: 10/26/2023 CONCLUSIONS:  1. Successful implantation of a Medtronic dual-chamber pacemaker for symptomatic bradycardia due to sinus node dysfunction  2. No early apparent complications.       Lewayne Bunting, MD 10/26/2023 4:23 PM   ECHOCARDIOGRAM COMPLETE Result Date: 10/26/2023    ECHOCARDIOGRAM REPORT   Patient Name:   Austin Espinoza Date of Exam: 10/26/2023  Medical Rec #:  [id number]       Height:       70.0 in Accession #:    [id number]      Weight:       154.5 lb Date of Birth:  12-Jan-1956       BSA:          1.871 m Patient Age:    67 years        BP:           131/84 mmHg Patient Gender: M               HR:           77 bpm. Exam Location:  Inpatient Procedure: 2D Echo, Color Doppler and Cardiac Doppler (Both Spectral and Color            Flow Doppler were utilized during procedure). Indications:    I50.21 Acute systolic (congestive) heart failure  History:        Patient has prior history of Echocardiogram examinations, most                 recent 12/29/2017. CAD; Risk Factors:Hypertension, Diabetes,                 Dyslipidemia and Polysubstance Abuse.  Sonographer:    Irving Burton Senior RDCS Referring Phys: [id number] ALMA L DIAZ IMPRESSIONS  1. Left ventricular ejection fraction, by estimation, is 25 to 30%. The left ventricle has severely decreased function. The left ventricle demonstrates global hypokinesis. The left ventricular internal cavity size was mildly dilated. Left ventricular diastolic parameters are consistent with Grade III diastolic dysfunction (restrictive). Elevated left atrial pressure.  2. Right ventricular systolic function is moderately reduced. The right ventricular size is mildly enlarged. There is moderately elevated pulmonary artery systolic pressure.  3. Left atrial size was mildly dilated.  4. Right atrial size was mildly dilated.  5. The mitral valve is normal in structure. Moderate mitral valve regurgitation.  6. Tricuspid valve regurgitation is mild to moderate.  7. The aortic valve is tricuspid. Aortic valve regurgitation is not visualized.  8. The inferior vena cava is dilated in size with >50% respiratory variability, suggesting right atrial pressure of 8 mmHg. FINDINGS  Left Ventricle: Left ventricular ejection fraction, by estimation, is 25 to 30%. The left ventricle has severely decreased function. The left ventricle demonstrates global  hypokinesis. The left ventricular internal cavity size was mildly dilated. There is no left ventricular hypertrophy. Left ventricular diastolic parameters are consistent with Grade III diastolic dysfunction (restrictive). Elevated left atrial pressure. Right Ventricle: The right ventricular size is mildly enlarged. No increase in right ventricular wall thickness. Right ventricular systolic function is moderately reduced. There is moderately elevated pulmonary artery systolic pressure. The tricuspid regurgitant velocity is 3.38 m/s, and with an assumed right atrial pressure of 8 mmHg, the estimated right ventricular systolic pressure is 53.7 mmHg. Left Atrium: Left atrial size was mildly dilated. Right Atrium: Right atrial size was mildly dilated. Pericardium: There is no evidence of pericardial effusion. Mitral Valve: The mitral valve is normal in structure. Moderate mitral valve regurgitation. Tricuspid Valve: The tricuspid valve is normal in structure. Tricuspid valve regurgitation is mild to moderate. Aortic Valve: The aortic valve is tricuspid. Aortic valve regurgitation is not visualized. Pulmonic Valve: The pulmonic valve was normal in structure. Pulmonic valve regurgitation is trivial. Aorta: The aortic root and ascending aorta are structurally normal, with no evidence of dilitation. Venous: The inferior vena cava is dilated in size with greater than 50% respiratory variability, suggesting right atrial pressure of 8 mmHg. IAS/Shunts: No atrial level shunt detected by color flow Doppler. Additional Comments: 3D imaging was not performed.  LEFT VENTRICLE PLAX 2D LVIDd:         5.70 cm      Diastology LVIDs:         5.50 cm      LV e' medial:    4.13 cm/s LV PW:         0.70 cm      LV E/e' medial:  29.5 LV IVS:        0.70 cm      LV e' lateral:   5.98 cm/s LVOT diam:     2.20 cm      LV E/e' lateral: 20.4 LV SV:         46 LV SV Index:   25 LVOT Area:     3.80 cm  LV Volumes (MOD) LV vol d, MOD A2C: 164.0 ml  LV vol d, MOD A4C: 151.0 ml LV vol s, MOD A2C: 119.0 ml LV vol s, MOD A4C: 95.8 ml LV SV MOD A2C:     45.0 ml LV SV MOD A4C:     151.0 ml LV SV MOD BP:      48.3 ml RIGHT VENTRICLE RV S prime:     7.94 cm/s TAPSE (M-mode): 1.3 cm LEFT ATRIUM             Index        RIGHT ATRIUM           Index LA diam:        4.20 cm 2.25 cm/m   RA Area:     18.60 cm LA Vol (A2C):   68.0 ml 36.35 ml/m  RA Volume:   55.30 ml  29.56 ml/m LA Vol (A4C):   58.6 ml 31.33 ml/m LA Biplane Vol: 68.8 ml 36.78 ml/m  AORTIC VALVE LVOT Vmax:   76.30 cm/s LVOT Vmean:  50.100 cm/s LVOT VTI:    0.122 m  AORTA Ao Root diam: 2.80 cm Ao Asc diam:  3.40 cm MITRAL VALVE                  TRICUSPID VALVE MV Area (PHT): 7.16 cm       TR Peak grad:   45.7  mmHg MV Decel Time: 106 msec       TR Vmax:        338.00 cm/s MR Peak grad:    84.3 mmHg MR Mean grad:    57.0 mmHg    SHUNTS MR Vmax:         459.00 cm/s  Systemic VTI:  0.12 m MR Vmean:        366.0 cm/s   Systemic Diam: 2.20 cm MR PISA:         1.27 cm MR PISA Eff ROA: 11 mm MR PISA Radius:  0.45 cm MV E velocity: 122.00 cm/s MV A velocity: 41.30 cm/s MV E/A ratio:  2.95 Clearnce Hasten Electronically signed by Clearnce Hasten Signature Date/Time: 10/26/2023/10:16:26 AM    Final    CARDIAC CATHETERIZATION Result Date: 10/25/2023 Successful TVP placement.   VAS [place] ABI WITH/WO TBI Result Date: 10/08/2023  LOWER EXTREMITY DOPPLER STUDY Patient Name:  Austin Espinoza  Date of Exam:   10/08/2023 Medical Rec #: [id number]        Accession #:    [id number] Date of Birth: [birth year]        Patient Gender: M Patient Age:   56 years Exam Location:  [facility] Procedure:      VAS [place] ABI WITH/WO TBI Referring Phys: Carolynn Sayers --------------------------------------------------------------------------------  Indications: Claudication. High Risk Factors: Hypertension, hyperlipidemia, coronary artery disease.  Vascular Interventions: Patient is status post left common and external iliac                          stents 07/18/2015. Left to right Fem-Fem BPG 01/29/2015. Comparison Study: 11/03/18 prior Performing Technologist: [place] Ponder RVS  Examination Guidelines: A complete evaluation includes at minimum, Doppler waveform signals and systolic blood pressure reading at the level of bilateral brachial, anterior tibial, and posterior tibial arteries, when vessel segments are accessible. Bilateral testing is considered an integral part of a complete examination. Photoelectric Plethysmograph (PPG) waveforms and toe systolic pressure readings are included as required and additional duplex testing as needed. Limited examinations for reoccurring indications may be performed as noted.  ABI Findings: +---------+------------------+-----+----------+--------+ Right    Rt Pressure (mmHg)IndexWaveform  Comment  +---------+------------------+-----+----------+--------+ Brachial 146                                       +---------+------------------+-----+----------+--------+ PTA      136               0.93 monophasic         +---------+------------------+-----+----------+--------+ DP       111               0.76 monophasic         +---------+------------------+-----+----------+--------+ Great Toe78                0.53                    +---------+------------------+-----+----------+--------+ +---------+------------------+-----+----------+-------+ Left     Lt Pressure (mmHg)IndexWaveform  Comment +---------+------------------+-----+----------+-------+ Brachial 147                                      +---------+------------------+-----+----------+-------+ PTA      117  0.80 monophasic        +---------+------------------+-----+----------+-------+ DP       150               1.02 monophasic        +---------+------------------+-----+----------+-------+ Great Toe85                0.58                    +---------+------------------+-----+----------+-------+ +-------+-----------+-----------+------------+------------+ ABI/TBIToday's ABIToday's TBIPrevious ABIPrevious TBI +-------+-----------+-----------+------------+------------+ Right  0.93       0.53       1.01        0.57         +-------+-----------+-----------+------------+------------+ Left   1.02       0.58       0.98        0.67         +-------+-----------+-----------+------------+------------+  Summary: Right: Resting right ankle-brachial index indicates mild right lower extremity arterial disease. Left: Resting left ankle-brachial index is within normal range. *See table(s) above for measurements and observations.  Electronically signed by Carolynn Sayers on 10/08/2023 at 9:41:25 AM.    Final    VAS [place] FEMORAL-FEMORAL BYPASS GRAFT Result Date: 10/08/2023 LOWER EXTREMITY ARTERIAL DUPLEX STUDY Patient Name:  Austin Espinoza  Date of Exam:   10/08/2023 Medical Rec #: [id number]        Accession #:    [id number] Date of Birth: 1956-03-29        Patient Gender: M Patient Age:   35 years Exam Location:  [facility] Procedure:      VAS [place] FEMORAL-FEMORAL BYPASS GRAFT Referring Phys: Carolynn Sayers --------------------------------------------------------------------------------  Indications: Claudication. High Risk Factors: Hypertension, hyperlipidemia, coronary artery disease.  Vascular Interventions: Patient is status post left common and external iliac                         stents 07/18/2015. Left to right Fem-Fem BPG 01/29/2015. Current ABI:            R 0.93 L 1.02 Comparison Study: 11/03/18 Performing Technologist: [place] Ponder RVS  Examination Guidelines: A complete evaluation includes B-mode imaging, spectral Doppler, color Doppler, and power Doppler as needed of all accessible portions of each vessel. Bilateral testing is considered an integral part of a complete examination. Limited examinations for reoccurring indications  may be performed as noted.   Left Graft #1: fem-fem +--------------------+--------+--------+----------+--------+                     PSV cm/sStenosisWaveform  Comments +--------------------+--------+--------+----------+--------+ Inflow              127             biphasic           +--------------------+--------+--------+----------+--------+ Proximal Anastomosis106             biphasic           +--------------------+--------+--------+----------+--------+ Proximal Graft      112             biphasic           +--------------------+--------+--------+----------+--------+ Mid Graft           78              monophasic         +--------------------+--------+--------+----------+--------+ Distal Graft        100  monophasic         +--------------------+--------+--------+----------+--------+ Distal Anastomosis  88              monophasic         +--------------------+--------+--------+----------+--------+ Outflow             72              monophasic         +--------------------+--------+--------+----------+--------+   Summary: Left: Patent fem-fem bypass without evidence of stenosis.  See table(s) above for measurements and observations. Electronically signed by Carolynn Sayers on 10/08/2023 at 9:41:08 AM.    Final    Disposition   Patient is being discharged home today in good condition.  Follow-up Plans & Appointments     Follow-up Information     Flossie Dibble, NP Follow up.   Specialty: Cardiology Why: Hospital follow-up with General Cardiology scheduled for 11/05/2023 at 10:05am at our [facility] office. Please arrive 15 minutes early for check-in. If this date/ time does not work for you, please call our office to reschedule. Contact information: [address] [place] [address] [phone]         [facility] Follow up.   Specialty: Cardiology Why: Wound check appointment scheduled for 11/10/2023 at  3:20pm at our [facility] office in [place]. Contact information: [address] [place] [address] [phone]        Austin Conradi, NP. Go on 11/02/2023.   Why: @10 :10am Contact information: [address] [place] [address] [phone]         [facility] Follow up.   Why: Agency will contact you to set up apt times Contact information: [address] [place] [address] [phone]         [facility] Follow up.   Why: rolling walker Contact information: [phone]               Discharge Instructions     Diet - low sodium heart healthy   Complete by: As directed    Increase activity slowly   Complete by: As directed         Discharge Medications   Allergies as of 10/28/2023       Reactions   Ibuprofen Itching, Rash   Itching/rash with Duexis (famotidine and ibuprofen), patient is unaware   Lisinopril Swelling   Patient is unsure         Medication List     STOP taking these medications    amLODipine 2.5 MG tablet Commonly known as: NORVASC       TAKE these medications    aspirin EC 81 MG tablet Take 81 mg by mouth daily.   carvedilol 3.125 MG tablet Commonly known as: COREG Take 1 tablet (3.125 mg total) by mouth 2 (two) times daily with a meal. What changed:  medication strength how much to take when to take this   CHOLECALCIFEROL PO Take 1 tablet by mouth every evening.   Dupixent 300 MG/2ML Soaj Generic drug: Dupilumab Inject 1 each into the skin every 14 (fourteen) days.   Farxiga 10 MG Tabs tablet Generic drug: dapagliflozin propanediol Take 1 tablet (10 mg total) by mouth daily.   FeroSul 325 (65 Fe) MG tablet Generic drug: ferrous sulfate Take 1 tablet (325 mg total) by mouth daily with breakfast. Start taking on: October 29, 2023   gabapentin 300 MG capsule Commonly  known as: NEURONTIN Take 300 mg by mouth 2 (two) times daily.    metFORMIN 500 MG tablet Commonly known as: GLUCOPHAGE Take 500 mg by mouth 2 (two) times daily with a meal.   nicotine 14 mg/24hr patch Commonly known as: NICODERM CQ - dosed in mg/24 hours Place 1 patch (14 mg total) onto the skin daily.   nitroGLYCERIN 0.4 MG SL tablet Commonly known as: NITROSTAT Take 0.4 mg by mouth every 5 (five) minutes as needed for chest pain.   rosuvastatin 20 MG tablet Commonly known as: CRESTOR Take 1 tablet (20 mg total) by mouth daily. Start taking on: October 29, 2023 What changed:  medication strength how much to take   spironolactone 25 MG tablet Commonly known as: ALDACTONE Take 0.5 tablets (12.5 mg total) by mouth daily. Start taking on: October 29, 2023               Durable Medical Equipment  (From admission, onward)           Start     Ordered   10/27/23 1540  For home use only DME Walker rolling  Once       Question Answer Comment  Walker: With 5 Inch Wheels   Patient needs a walker to treat with the following condition Weakness      10/27/23 1540               Outstanding Labs/Studies   Repeat BMET and CBC at follow-up next week. Repeat lipid panel and LFTs in 6-8 weeks.   Duration of Discharge Encounter: APP Time: 25 minutes   Signed, Corrin Parker, PA-C 10/28/2023, 1:43 PM  History and all data above reviewed.  Patient examined.  I agree with the findings as above.  Patient feeling better and was able to ambulate.  Rhythm is noted and there is atrial ectopy but no clear fib.   The patient exam reveals COR:RRR  ,  Lungs: Clear  ,  Abd: Positive bowel sounds, no rebound no guarding, Ext No edema,  Chest: Pacer pocket intact.  .  All available labs, radiology testing, previous records reviewed. Agree with documented assessment and plan.   Syncope:  Post pacemaker.  We have arranged follow up as above.  Arrhythmia:  No plan for anticoag.  No clear atrial fib and he is not an anticoag candidate.  (Greater  than 1/2 time of discharge with MD time.)   Rollene Rotunda  1:54 PM  10/28/2023

## 2023-10-27 NOTE — Progress Notes (Addendum)
  Patient Name: Austin Espinoza Date of Encounter: 10/27/2023  Primary Cardiologist: None Electrophysiologist: Dr. Ladona Ridgel  Interval Summary   No new complaints this am.   Vital Signs    Vitals:   10/26/23 2121 10/26/23 2355 10/27/23 0350 10/27/23 0804  BP: (!) 149/83 127/89 (!) 138/94 (!) 143/94  Pulse: 82 81 82 70  Resp: 14 19 18 20   Temp: (!) 97.4 F (36.3 C) (!) 97.4 F (36.3 C) (!) 97.5 F (36.4 C) (!) 97.3 F (36.3 C)  TempSrc: Oral Oral Oral Oral  SpO2: 100% 100% 100% 99%  Weight: 67 kg  66 kg   Height:        Intake/Output Summary (Last 24 hours) at 10/27/2023 0912 Last data filed at 10/27/2023 0353 Gross per 24 hour  Intake 1013.23 ml  Output 650 ml  Net 363.23 ml   Filed Weights   10/26/23 0700 10/26/23 2121 10/27/23 0350  Weight: 70.1 kg 67 kg 66 kg    Physical Exam    GEN- The patient is well appearing, alert and oriented x 3 today.   Lungs- Clear to ausculation bilaterally, normal work of breathing Cardiac- Regular rate and rhythm, no murmurs, rubs or gallops GI- soft, NT, ND, + BS Extremities- no clubbing or cyanosis. No edema  Telemetry    NSR 70-80s (personally reviewed)  Hospital Course    Austin Espinoza is a 68 y.o. male with a history of HFrEF, CAD (CTO of RCA, prior LAD stent), PAF on ASA, OFF OAC with hemoptysis and anemia, polysubstance abuse, HTN, and HLD who is being seen today for the evaluation of syncope and pauses at the request of Dr. Gala Romney   Assessment & Plan    Syncope Sinus arrest with ventricular standstill S/p MDT PPM 2/25 by Dr. Ladona Ridgel Wound care and arm restrictions reviewed with pt.  CXR appears stable, formal read pending.  Usual follow up in place.    Acute on chronic CHF ICM EF 25-30%, mild mod MR, mild mod TR at [place] Not candidate for advanced therapies with polysubstance abuse and hx non-compliance.   Polysubstance abuse Increased risk of complication down the road. Cessation encouraged.   EP will  see as needed while remains here.   For questions or updates, please contact [facility] Please consult [email or web address] for contact info under Cardiology/STEMI.  Signed, Graciella Freer, PA-C  10/27/2023, 9:12 AM   EP Attending  Patient seen and examined. Agree with above. The patient is doing well after PPM. His PPM interrogation demonstrates normal DDD PPM function. CXR demonstrates no PTX and Tele with NSR with atrial pacing. A/P Sinus node dysfunction - he is stable after PPM insertion and removal of the temporary PM. Chronic systolic heart failure - initiated GDMT including beta blocker. Polysubstance abuse - I encouraged abstinence.   Austin Espinoza [place],MD

## 2023-10-27 NOTE — Telephone Encounter (Signed)
 Follow-up after same day discharge: Implant date: YES MD: Lewayne Bunting, MD Device: PPM MDT  Location: L CHEST   Wound check visit: YES 90 day MD follow-up: YES  Remote Transmission received:NOT YET PT STILL IN HOSPITAL   Dressing/sling removed: N/A  Confirm OAC restart on: N/A  Please continue to monitor your cardiac device site for redness, swelling, and drainage. Call the device clinic at [phone] if you experience these symptoms, fever/chills, or have questions about your device.   Remote monitoring is used to monitor your cardiac device from home. This monitoring is scheduled every 91 days by our office. It allows [place] to keep an eye on the functioning of your device to ensure it is working properly.  Pt is still admitted in hospital

## 2023-10-27 NOTE — TOC Transition Note (Addendum)
 Transition of Care [facility]) - Discharge Note   Patient Details  Name: Austin Espinoza MRN: [id number] Date of Birth: [birth year]  [facility]) CM/SW Contact:  Leone Haven, RN Phone Number: 10/27/2023, 2:25 PM   Clinical Narrative:    For possible dc today, await pt eval.  Brother is at the bedside to transport home. He has a follow up apt on 3/4 at 10 am with his PCP.   NCM offered choice for HHPT, he states he does not have a preference, for HH or DME,  NCM made referral to Amy with [facility] for HHPT , she is able to take referral.  Soc will begin 24 to 48 hrs post dc.  NCM made referral to [facility] with [employer] for rolling walker, this will be delivered to patient's room prior to dc.     Barriers to Discharge: Continued Medical Work up   Patient Goals and CMS Choice Patient states their goals for this hospitalization and ongoing recovery are:: return home   Choice offered to / list presented to : NA      Discharge Placement                       Discharge Plan and Services Additional resources added to the After Visit Summary for   In-house Referral: NA Discharge Planning Services: CM Consult Post Acute Care Choice: NA          DME Arranged: N/A DME Agency: NA                  Social Drivers of Health (SDOH) Interventions SDOH Screenings   Food Insecurity: No Food Insecurity (10/26/2023)  Housing: Low Risk  (10/26/2023)  Transportation Needs: No Transportation Needs (10/26/2023)  Utilities: Not At Risk (10/26/2023)  Social Connections: Patient Declined (10/27/2023)  Tobacco Use: High Risk (10/08/2023)     Readmission Risk Interventions    10/27/2023    2:15 PM  Readmission Risk Prevention Plan  Post Dischage Appt Complete  Medication Screening Complete  Transportation Screening Complete

## 2023-10-28 ENCOUNTER — Telehealth: Payer: Self-pay | Admitting: Student

## 2023-10-28 ENCOUNTER — Other Ambulatory Visit (HOSPITAL_COMMUNITY): Payer: Self-pay

## 2023-10-28 DIAGNOSIS — N179 Acute kidney failure, unspecified: Secondary | ICD-10-CM

## 2023-10-28 DIAGNOSIS — E118 Type 2 diabetes mellitus with unspecified complications: Secondary | ICD-10-CM | POA: Insufficient documentation

## 2023-10-28 HISTORY — DX: Type 2 diabetes mellitus with unspecified complications: E11.8

## 2023-10-28 HISTORY — DX: Acute kidney failure, unspecified: N17.9

## 2023-10-28 LAB — CBC
HCT: 33 % — ABNORMAL LOW (ref 39.0–52.0)
Hemoglobin: 10.8 g/dL — ABNORMAL LOW (ref 13.0–17.0)
MCH: 28.7 pg (ref 26.0–34.0)
MCHC: 32.7 g/dL (ref 30.0–36.0)
MCV: 87.8 fL (ref 80.0–100.0)
Platelets: 181 10*3/uL (ref 150–400)
RBC: 3.76 MIL/uL — ABNORMAL LOW (ref 4.22–5.81)
RDW: 16.9 % — ABNORMAL HIGH (ref 11.5–15.5)
WBC: 7.1 10*3/uL (ref 4.0–10.5)
nRBC: 1 % — ABNORMAL HIGH (ref 0.0–0.2)

## 2023-10-28 LAB — BASIC METABOLIC PANEL
Anion gap: 10 (ref 5–15)
BUN: 18 mg/dL (ref 8–23)
CO2: 22 mmol/L (ref 22–32)
Calcium: 9.9 mg/dL (ref 8.9–10.3)
Chloride: 107 mmol/L (ref 98–111)
Creatinine, Ser: 1.27 mg/dL — ABNORMAL HIGH (ref 0.61–1.24)
GFR, Estimated: >60 mL/min (ref >60)
Glucose, Bld: 83 mg/dL (ref 70–99)
Potassium: 3.9 mmol/L (ref 3.5–5.1)
Sodium: 139 mmol/L (ref 135–145)

## 2023-10-28 LAB — MAGNESIUM: Magnesium: 2.3 mg/dL (ref 1.7–2.4)

## 2023-10-28 MED ORDER — ROSUVASTATIN CALCIUM 20 MG PO TABS
20.0000 mg | ORAL_TABLET | Freq: Every day | ORAL | Status: DC
Start: 2023-10-29 — End: 2023-10-28

## 2023-10-28 MED ORDER — DAPAGLIFLOZIN PROPANEDIOL 10 MG PO TABS
10.0000 mg | ORAL_TABLET | Freq: Every day | ORAL | 2 refills | Status: DC
  Filled 2023-10-28: qty 30, 30d supply, fill #0

## 2023-10-28 MED ORDER — CARVEDILOL 3.125 MG PO TABS
3.1250 mg | ORAL_TABLET | Freq: Two times a day (BID) | ORAL | 2 refills | Status: DC
  Filled 2023-10-28: qty 60, 30d supply, fill #0

## 2023-10-28 MED ORDER — SPIRONOLACTONE 25 MG PO TABS
12.5000 mg | ORAL_TABLET | Freq: Every day | ORAL | 2 refills | Status: DC
  Filled 2023-10-28: qty 15, 30d supply, fill #0

## 2023-10-28 MED ORDER — FERROUS SULFATE 325 (65 FE) MG PO TABS
325.0000 mg | ORAL_TABLET | Freq: Every day | ORAL | 1 refills | Status: AC
  Filled 2023-10-28: qty 30, 30d supply, fill #0

## 2023-10-28 MED ORDER — NICOTINE 14 MG/24HR TD PT24
14.0000 mg | MEDICATED_PATCH | Freq: Every day | TRANSDERMAL | 0 refills | Status: DC
  Filled 2023-10-28: qty 28, 28d supply, fill #0

## 2023-10-28 MED ORDER — ROSUVASTATIN CALCIUM 20 MG PO TABS
20.0000 mg | ORAL_TABLET | Freq: Every day | ORAL | 2 refills | Status: DC
  Filled 2023-10-28: qty 30, 30d supply, fill #0

## 2023-10-28 NOTE — TOC Transition Note (Signed)
 Transition of Care [facility]) - Discharge Note   Patient Details  Name: Austin Espinoza MRN: [id number] Date of Birth: 1955/09/11  Transition of Care [facility]) CM/SW Contact:  Leone Haven, RN Phone Number: 10/28/2023, 12:18 PM   Clinical Narrative:    For dc today, NCM notified Amy with [facility] of dc, he has transport home.  [employer] has delivered the walker to the room.      Barriers to Discharge: Continued Medical Work up   Patient Goals and CMS Choice Patient states their goals for this hospitalization and ongoing recovery are:: return home   Choice offered to / list presented to : NA      Discharge Placement                       Discharge Plan and Services Additional resources added to the After Visit Summary for   In-house Referral: NA Discharge Planning Services: CM Consult Post Acute Care Choice: NA          DME Arranged: N/A DME Agency: NA                  Social Drivers of Health (SDOH) Interventions SDOH Screenings   Food Insecurity: No Food Insecurity (10/26/2023)  Housing: Low Risk  (10/26/2023)  Transportation Needs: No Transportation Needs (10/26/2023)  Utilities: Not At Risk (10/26/2023)  Social Connections: Patient Declined (10/27/2023)  Tobacco Use: High Risk (10/08/2023)     Readmission Risk Interventions    10/27/2023    2:15 PM  Readmission Risk Prevention Plan  Post Dischage Appt Complete  Medication Screening Complete  Transportation Screening Complete

## 2023-10-28 NOTE — Telephone Encounter (Signed)
   Transition of Care Follow-up Phone Call Request    Patient Name: Austin Espinoza Date of Birth: November 14, 1955 Date of Encounter: 10/28/2023  Primary Care Provider:  Hortencia Conradi, NP Primary Cardiologist:  None  Austin Espinoza has been scheduled for a transition of care follow up appointment with a [facility] provider:  11/05/2023 at 10:05am with Wallis Bamberg, NP.   Please reach out to Austin Espinoza within 48 hours of discharge to confirm appointment and review transition of care protocol questionnaire.  Anticipated discharge date: 10/28/2023  Corrin Parker, PA-C  10/28/2023, 12:01 PM

## 2023-10-28 NOTE — Care Management Important Message (Signed)
 Important Message  Patient Details  Name: Austin Espinoza MRN: [id number] Date of Birth: 04/19/1956   Important Message Given:  Yes - Medicare IM     Renie Ora 10/28/2023, 10:30 AM

## 2023-10-29 NOTE — Telephone Encounter (Signed)
 TOC call. LVM on Cell phone to call office.

## 2023-11-04 ENCOUNTER — Encounter: Payer: Self-pay | Admitting: *Deleted

## 2023-11-04 NOTE — Progress Notes (Signed)
 Cardiology Office Note:  .   Date:  11/05/2023  ID:  Austin Espinoza, DOB 03-10-1956, MRN [id number] PCP: Hortencia Conradi, NP  [facility] Providers Cardiologist:  None    History of Present Illness: .   Austin Espinoza is a 68 y.o. male with a past medical history of CAD with non-STEMI 2007 s/p BMS to LAD and CTO of RCA, symptomatic bradycardia s/p PPM implantation, HFrEF, moderate mitral regurgitation, mild to moderate tricuspid regurgitation, PAF not on anticoagulant, PAD s/p drug-coated balloon angioplasty of left iliac system 10/20/2016, history of cocaine abuse, tobacco abuse.  10/26/2023 ppm implantation Medtronic dual-chamber for symptomatic bradycardia due to sinus node dysfunction 10/26/2023 echo EF 25 to 30%, severely decreased LV function with global hypokinesis, grade 3 DD, moderately elevated PASP, biatrial enlargement, moderate MR, mild to moderate TR 10/08/2023 ultrasound femoral bypass graft patent femorofemoral bypass without evidence of stenosis  Initially presented to [facility] for syncope UDS was positive for cocaine, BNP was elevated at 5650, echo revealed EF 30 to 35%, he had a prolonged pause on his EKG requiring chest compressions>> transferred to [facility], underwent temporary pacemaker implantation >> repeat echo revealed an EF of 25 to 30%, he ultimately underwent permanent pacemaker implantation for sinus node dysfunction on 10/26/2023.  He was not felt to be a candidate for ICD implantation due to history of cocaine use.  He presents today accompanied by sister for follow-up after recent hospitalization.  He says he is feeling much better, not had any further episodes of dizziness or near syncope.  He is abstaining from cocaine, tobacco, as well as alcohol.  He lives at home with his brother, he has an aide that also helps him, his sister is close by and also provide support.  He will see EP next week for follow-up of his newly implanted PPM. He denies chest  pain, palpitations, dyspnea, pnd, orthopnea, n, v, dizziness, syncope, edema, weight gain, or early satiety.    ROS: Review of Systems  Respiratory:  Positive for shortness of breath.   Neurological:  Positive for dizziness.  All other systems reviewed and are negative.    Studies Reviewed: .        Cardiac Studies & Procedures   ______________________________________________________________________________________________ CARDIAC CATHETERIZATION  CARDIAC CATHETERIZATION 10/25/2023  Narrative Successful TVP placement.   STRESS TESTS  MYOCARDIAL PERFUSION IMAGING 01/11/2015  Narrative There was a large, severe fixed inferior/inferolateral defect suggestive of prior infarction.  No ischemia.  High risk study due to low EF (29%) with inferior and inferolateral akinesis.   ECHOCARDIOGRAM  ECHOCARDIOGRAM COMPLETE 10/26/2023  Narrative ECHOCARDIOGRAM REPORT    Patient Name:   Austin Espinoza Date of Exam: 10/26/2023 Medical Rec #:  [id number]       Height:       70.0 in Accession #:    [id number]      Weight:       154.5 lb Date of Birth:  Apr 24, 1956       BSA:          1.871 m Patient Age:    67 years        BP:           131/84 mmHg Patient Gender: M               HR:           77 bpm. Exam Location:  Inpatient  Procedure: 2D Echo, Color Doppler and Cardiac Doppler (Both Spectral and Color Flow Doppler  were utilized during procedure).  Indications:    I50.21 Acute systolic (congestive) heart failure  History:        Patient has prior history of Echocardiogram examinations, most recent 12/29/2017. CAD; Risk Factors:Hypertension, Diabetes, Dyslipidemia and Polysubstance Abuse.  Sonographer:    Irving Burton Senior RDCS Referring Phys: [id number] ALMA L DIAZ  IMPRESSIONS   1. Left ventricular ejection fraction, by estimation, is 25 to 30%. The left ventricle has severely decreased function. The left ventricle demonstrates global hypokinesis. The left ventricular internal cavity  size was mildly dilated. Left ventricular diastolic parameters are consistent with Grade III diastolic dysfunction (restrictive). Elevated left atrial pressure. 2. Right ventricular systolic function is moderately reduced. The right ventricular size is mildly enlarged. There is moderately elevated pulmonary artery systolic pressure. 3. Left atrial size was mildly dilated. 4. Right atrial size was mildly dilated. 5. The mitral valve is normal in structure. Moderate mitral valve regurgitation. 6. Tricuspid valve regurgitation is mild to moderate. 7. The aortic valve is tricuspid. Aortic valve regurgitation is not visualized. 8. The inferior vena cava is dilated in size with >50% respiratory variability, suggesting right atrial pressure of 8 mmHg.  FINDINGS Left Ventricle: Left ventricular ejection fraction, by estimation, is 25 to 30%. The left ventricle has severely decreased function. The left ventricle demonstrates global hypokinesis. The left ventricular internal cavity size was mildly dilated. There is no left ventricular hypertrophy. Left ventricular diastolic parameters are consistent with Grade III diastolic dysfunction (restrictive). Elevated left atrial pressure.  Right Ventricle: The right ventricular size is mildly enlarged. No increase in right ventricular wall thickness. Right ventricular systolic function is moderately reduced. There is moderately elevated pulmonary artery systolic pressure. The tricuspid regurgitant velocity is 3.38 m/s, and with an assumed right atrial pressure of 8 mmHg, the estimated right ventricular systolic pressure is 53.7 mmHg.  Left Atrium: Left atrial size was mildly dilated.  Right Atrium: Right atrial size was mildly dilated.  Pericardium: There is no evidence of pericardial effusion.  Mitral Valve: The mitral valve is normal in structure. Moderate mitral valve regurgitation.  Tricuspid Valve: The tricuspid valve is normal in structure. Tricuspid  valve regurgitation is mild to moderate.  Aortic Valve: The aortic valve is tricuspid. Aortic valve regurgitation is not visualized.  Pulmonic Valve: The pulmonic valve was normal in structure. Pulmonic valve regurgitation is trivial.  Aorta: The aortic root and ascending aorta are structurally normal, with no evidence of dilitation.  Venous: The inferior vena cava is dilated in size with greater than 50% respiratory variability, suggesting right atrial pressure of 8 mmHg.  IAS/Shunts: No atrial level shunt detected by color flow Doppler.  Additional Comments: 3D imaging was not performed.   LEFT VENTRICLE PLAX 2D LVIDd:         5.70 cm      Diastology LVIDs:         5.50 cm      LV e' medial:    4.13 cm/s LV PW:         0.70 cm      LV E/e' medial:  29.5 LV IVS:        0.70 cm      LV e' lateral:   5.98 cm/s LVOT diam:     2.20 cm      LV E/e' lateral: 20.4 LV SV:         46 LV SV Index:   25 LVOT Area:     3.80 cm  LV Volumes (MOD) LV  vol d, MOD A2C: 164.0 ml LV vol d, MOD A4C: 151.0 ml LV vol s, MOD A2C: 119.0 ml LV vol s, MOD A4C: 95.8 ml LV SV MOD A2C:     45.0 ml LV SV MOD A4C:     151.0 ml LV SV MOD BP:      48.3 ml  RIGHT VENTRICLE RV S prime:     7.94 cm/s TAPSE (M-mode): 1.3 cm  LEFT ATRIUM             Index        RIGHT ATRIUM           Index LA diam:        4.20 cm 2.25 cm/m   RA Area:     18.60 cm LA Vol (A2C):   68.0 ml 36.35 ml/m  RA Volume:   55.30 ml  29.56 ml/m LA Vol (A4C):   58.6 ml 31.33 ml/m LA Biplane Vol: 68.8 ml 36.78 ml/m AORTIC VALVE LVOT Vmax:   76.30 cm/s LVOT Vmean:  50.100 cm/s LVOT VTI:    0.122 m  AORTA Ao Root diam: 2.80 cm Ao Asc diam:  3.40 cm  MITRAL VALVE                  TRICUSPID VALVE MV Area (PHT): 7.16 cm       TR Peak grad:   45.7 mmHg MV Decel Time: 106 msec       TR Vmax:        338.00 cm/s MR Peak grad:    84.3 mmHg MR Mean grad:    57.0 mmHg    SHUNTS MR Vmax:         459.00 cm/s  Systemic VTI:  0.12  m MR Vmean:        366.0 cm/s   Systemic Diam: 2.20 cm MR PISA:         1.27 cm MR PISA Eff ROA: 11 mm MR PISA Radius:  0.45 cm MV E velocity: 122.00 cm/s MV A velocity: 41.30 cm/s MV E/A ratio:  2.95  Clearnce Hasten Electronically signed by Clearnce Hasten Signature Date/Time: 10/26/2023/10:16:26 AM    Final          ______________________________________________________________________________________________      Risk Assessment/Calculations:             Physical Exam:   VS:  BP 100/60 (BP Location: Left Arm, Patient Position: Sitting, Cuff Size: Normal)   Pulse 83   Ht 5\' 10"  (1.778 m)   Wt 151 lb (68.5 kg)   SpO2 92%   BMI 21.67 kg/m    Wt Readings from Last 3 Encounters:  11/05/23 151 lb (68.5 kg)  10/28/23 150 lb 3.2 oz (68.1 kg)  02/03/19 162 lb (73.5 kg)    GEN: Well nourished, well developed in no acute distress NECK: No JVD; No carotid bruits CARDIAC: RRR, no murmurs, rubs, gallops RESPIRATORY:  Clear to auscultation without rales, wheezing or rhonchi  ABDOMEN: Soft, non-tender, non-distended EXTREMITIES:  No edema; No deformity   ASSESSMENT AND PLAN: .   Sick sinus syndrome-s/p PPM on 10/26/2023, will see EP next week.  Pacemaker site appears to be healing well, bandages are still in place, encouraged continued precautions until he is evaluated by EP.  HFrEF- NYHA class I-II, euvolemic. Upgrade to ICD can be considered once he is abstaining from cocaine use.  Continue Coreg 3.125 mg twice daily, continue Farxiga 10 mg daily, continue spironolactone 12.5 mg daily.  Unfortunately his blood pressure is low normal, and he has some dizziness so I do not think we will be able to start him on Entresto at this time.  CAD-s/p non-STEMI and BMS to LAD in 2017, also CTO of RCA.  Continue aspirin 81 mg daily, continue Coreg 3.125 mg twice daily, continue nitroglycerin as needed, continue Crestor 20 mg daily.Stable with no anginal symptoms. No indication for  ischemic evaluation.    PAD- Follows with [facility], s/p  drug-coated balloon angioplasty of left iliac system 10/20/2016.  Most recent ultrasound on 10/08/2023 revealed no restenosis.  Hypertension-blood pressure is low normal at 100/60, continue spironolactone 12.5 mg daily, continue Coreg 3.125 mg twice daily.  Substance abuse-former tobacco, cocaine, alcohol--he states he is abstaining from all substances since his most recent hospitalization.  Dyslipidemia -most recent LDL is elevated at 87, normal LFTs, will increase his Crestor to 40 mg daily. Repeat FLP and LFTs in 8 weeks.          Dispo: Inrease Crestor to 40 mg daily, will repeat FLP and LFTs when he sees Dr. Vincent Gros in 6 weeks. Follow up with Dr. Vincent Gros in 6 weeks to establish with cardiologist.   Signed, Flossie Dibble, NP

## 2023-11-04 NOTE — Telephone Encounter (Signed)
 Transmission received 10/29/2023.

## 2023-11-05 ENCOUNTER — Encounter: Payer: Self-pay | Admitting: Cardiology

## 2023-11-05 ENCOUNTER — Telehealth: Payer: Self-pay | Admitting: Cardiology

## 2023-11-05 ENCOUNTER — Ambulatory Visit: Payer: 59 | Attending: Cardiology | Admitting: Cardiology

## 2023-11-05 VITALS — BP 100/60 | HR 83 | Ht 70.0 in | Wt 151.0 lb

## 2023-11-05 DIAGNOSIS — I5020 Unspecified systolic (congestive) heart failure: Secondary | ICD-10-CM | POA: Diagnosis not present

## 2023-11-05 DIAGNOSIS — I10 Essential (primary) hypertension: Secondary | ICD-10-CM

## 2023-11-05 DIAGNOSIS — I495 Sick sinus syndrome: Secondary | ICD-10-CM

## 2023-11-05 DIAGNOSIS — I25118 Atherosclerotic heart disease of native coronary artery with other forms of angina pectoris: Secondary | ICD-10-CM

## 2023-11-05 DIAGNOSIS — F1910 Other psychoactive substance abuse, uncomplicated: Secondary | ICD-10-CM

## 2023-11-05 DIAGNOSIS — I739 Peripheral vascular disease, unspecified: Secondary | ICD-10-CM | POA: Diagnosis not present

## 2023-11-05 DIAGNOSIS — F1490 Cocaine use, unspecified, uncomplicated: Secondary | ICD-10-CM

## 2023-11-05 DIAGNOSIS — Z950 Presence of cardiac pacemaker: Secondary | ICD-10-CM

## 2023-11-05 MED ORDER — NITROGLYCERIN 0.4 MG SL SUBL: 0.4000 mg | SUBLINGUAL_TABLET | SUBLINGUAL | 3 refills | Status: AC | PRN

## 2023-11-05 NOTE — Telephone Encounter (Signed)
Lmtcb 3/7

## 2023-11-05 NOTE — Telephone Encounter (Signed)
 Left message for the patient to call back.

## 2023-11-05 NOTE — Patient Instructions (Signed)
 Medication Instructions:  Your physician recommends that you continue on your current medications as directed. Please refer to the Current Medication list given to you today.  *If you need a refill on your cardiac medications before your next appointment, please call your pharmacy*  Other Instructions:  Increase: Water in take to 48oz per day   Lab Work: None Ordered If you have labs (blood work) drawn today and your tests are completely normal, you will receive your results only by: MyChart Message (if you have MyChart) OR A paper copy in the mail If you have any lab test that is abnormal or we need to change your treatment, we will call you to review the results.   Testing/Procedures: None Ordered   Follow-Up: At [facility], you and your health needs are our priority.  As part of our continuing mission to provide you with exceptional heart care, we have created designated Provider Care Teams.  These Care Teams include your primary Cardiologist (physician) and Advanced Practice Providers (APPs -  Physician Assistants and Nurse Practitioners) who all work together to provide you with the care you need, when you need it.  We recommend signing up for the patient portal called "MyChart".  Sign up information is provided on this After Visit Summary.  MyChart is used to connect with patients for Virtual Visits (Telemedicine).  Patients are able to view lab/test results, encounter notes, upcoming appointments, etc.  Non-urgent messages can be sent to your provider as well.   To learn more about what you can do with MyChart, go to [email or web address].    Your next appointment:   6 week(s) with Dr. Prince Solian  The format for your next appointment:   In Person  Provider:   Wallis Bamberg, NP

## 2023-11-05 NOTE — Telephone Encounter (Signed)
 Called the patient and a lady answered the phone stating that we had the wrong number and the patient did not live there.

## 2023-11-08 ENCOUNTER — Other Ambulatory Visit (HOSPITAL_COMMUNITY): Payer: Self-pay

## 2023-11-10 ENCOUNTER — Telehealth: Payer: Self-pay

## 2023-11-10 ENCOUNTER — Ambulatory Visit: Payer: 59 | Attending: Cardiology

## 2023-11-10 DIAGNOSIS — I495 Sick sinus syndrome: Secondary | ICD-10-CM | POA: Diagnosis not present

## 2023-11-10 LAB — CUP PACEART INCLINIC DEVICE CHECK
Battery Remaining Longevity: 177 mo
Battery Voltage: 3.23 V
Brady Statistic AP VP Percent: 0.46 %
Brady Statistic AP VS Percent: 10.24 %
Brady Statistic AS VP Percent: 0.2 %
Brady Statistic AS VS Percent: 89.11 %
Brady Statistic RA Percent Paced: 11.22 %
Brady Statistic RV Percent Paced: 1.46 %
Date Time Interrogation Session: 20250312164916
Implantable Lead Connection Status: 753985
Implantable Lead Connection Status: 753985
Implantable Lead Implant Date: 20250225
Implantable Lead Implant Date: 20250225
Implantable Lead Location: 753859
Implantable Lead Location: 753860
Implantable Lead Model: 3830
Implantable Lead Model: 5076
Implantable Pulse Generator Implant Date: 20250225
Lead Channel Impedance Value: 285 Ohm
Lead Channel Impedance Value: 361 Ohm
Lead Channel Impedance Value: 456 Ohm
Lead Channel Impedance Value: 532 Ohm
Lead Channel Pacing Threshold Amplitude: 0.625 V
Lead Channel Pacing Threshold Amplitude: 0.625 V
Lead Channel Pacing Threshold Pulse Width: 0.4 ms
Lead Channel Pacing Threshold Pulse Width: 0.4 ms
Lead Channel Sensing Intrinsic Amplitude: 1.625 mV
Lead Channel Sensing Intrinsic Amplitude: 2.25 mV
Lead Channel Sensing Intrinsic Amplitude: 7.625 mV
Lead Channel Sensing Intrinsic Amplitude: 8 mV
Lead Channel Setting Pacing Amplitude: 3.5 V
Lead Channel Setting Pacing Amplitude: 3.5 V
Lead Channel Setting Pacing Pulse Width: 0.4 ms
Lead Channel Setting Sensing Sensitivity: 1.2 mV
Zone Setting Status: 755011

## 2023-11-10 NOTE — Telephone Encounter (Signed)
 Left vm to return call.

## 2023-11-10 NOTE — Patient Instructions (Signed)

## 2023-11-10 NOTE — Telephone Encounter (Signed)
 Pt lives in [place].  He is requesting to switch to Dr. Elberta Fortis d/t location.  Please advise.

## 2023-11-10 NOTE — Progress Notes (Signed)
 Normal Pacemaker wound check. Wound well healed. Thresholds, sensing, and impedances consistent with implant measurements and at 3.5V safety margin/auto capture until 3 month visit. Atrial fibrillation noted-burden 7.9%.  OAC contraindicated at this time. Reviewed arm restrictions to continue for 6 weeks total post op.  Pt enrolled in remote follow-up.

## 2023-11-11 ENCOUNTER — Telehealth: Payer: Self-pay | Admitting: Emergency Medicine

## 2023-11-11 NOTE — Telephone Encounter (Signed)
-----   Message from Flossie Dibble sent at 11/11/2023  9:39 AM EDT ----- Regarding: RE: spironolactone Let me see if we can call the pharmacy to see if they will give him some.   Thank you!  Abbie or Cammy, can we see if we can help him out? I do not want him going that long without his spiro as he was recently hospitalized. ----- Message ----- From: Wiliam Ke, RN Sent: 11/10/2023   4:51 PM EDT To: Flossie Dibble, NP Subject: spironolactone                                 Good afternoon!  I saw this Pt today.  His sister told me that she accidentally gave him a whole tablet of spironolactone daily instead of a 1/2 tablet.  So now they are out.  Her pharmacy told her her insurance won't cover a refill for 10 more days or so.  Is there anything that we need to do?  Boneta Lucks

## 2023-11-11 NOTE — Telephone Encounter (Signed)
 Called patients pharmacy ([facility]) and they informed me that a one month supply of Spironolactone would be around 13$. Attempted to call patients sister per DPR. However, she did not answer and no voicemail set up at this time.

## 2023-11-11 NOTE — Telephone Encounter (Signed)
Ok to switch. GT 

## 2023-11-15 NOTE — Telephone Encounter (Signed)
 Spoke with Austin Espinoza, pt's sister, and let her know the Spironolactone is available at the pharmacy for $13 if they wanted to pick it up. She stated that her sister had already picked it up. She verbalized understanding and had no further questions.

## 2023-11-19 ENCOUNTER — Other Ambulatory Visit (HOSPITAL_COMMUNITY): Payer: Self-pay

## 2023-12-13 ENCOUNTER — Ambulatory Visit (INDEPENDENT_AMBULATORY_CARE_PROVIDER_SITE_OTHER): Payer: 59

## 2023-12-13 DIAGNOSIS — I495 Sick sinus syndrome: Secondary | ICD-10-CM

## 2023-12-14 DIAGNOSIS — Z83719 Family history of colon polyps, unspecified: Secondary | ICD-10-CM | POA: Insufficient documentation

## 2023-12-14 DIAGNOSIS — T7840XA Allergy, unspecified, initial encounter: Secondary | ICD-10-CM | POA: Insufficient documentation

## 2023-12-14 DIAGNOSIS — F419 Anxiety disorder, unspecified: Secondary | ICD-10-CM | POA: Insufficient documentation

## 2023-12-14 LAB — CUP PACEART REMOTE DEVICE CHECK
Battery Remaining Longevity: 182 mo
Battery Voltage: 3.23 V
Brady Statistic AP VP Percent: 0.1 %
Brady Statistic AP VS Percent: 8.64 %
Brady Statistic AS VP Percent: 0.06 %
Brady Statistic AS VS Percent: 91.2 %
Brady Statistic RA Percent Paced: 9.37 %
Brady Statistic RV Percent Paced: 0.68 %
Date Time Interrogation Session: 20250413193253
Implantable Lead Connection Status: 753985
Implantable Lead Connection Status: 753985
Implantable Lead Implant Date: 20250225
Implantable Lead Implant Date: 20250225
Implantable Lead Location: 753859
Implantable Lead Location: 753860
Implantable Lead Model: 3830
Implantable Lead Model: 5076
Implantable Pulse Generator Implant Date: 20250225
Lead Channel Impedance Value: 266 Ohm
Lead Channel Impedance Value: 342 Ohm
Lead Channel Impedance Value: 475 Ohm
Lead Channel Impedance Value: 513 Ohm
Lead Channel Pacing Threshold Amplitude: 0.625 V
Lead Channel Pacing Threshold Amplitude: 0.625 V
Lead Channel Pacing Threshold Pulse Width: 0.4 ms
Lead Channel Pacing Threshold Pulse Width: 0.4 ms
Lead Channel Sensing Intrinsic Amplitude: 1.875 mV
Lead Channel Sensing Intrinsic Amplitude: 1.875 mV
Lead Channel Sensing Intrinsic Amplitude: 6 mV
Lead Channel Sensing Intrinsic Amplitude: 6 mV
Lead Channel Setting Pacing Amplitude: 1.5 V
Lead Channel Setting Pacing Amplitude: 2 V
Lead Channel Setting Pacing Pulse Width: 0.4 ms
Lead Channel Setting Sensing Sensitivity: 1.2 mV
Zone Setting Status: 755011

## 2023-12-17 NOTE — Telephone Encounter (Signed)
 Hi, routing from Jennifer's basket. Can we please make sure these Rosuvastatin  recommendations get relayed to Mr. Ono? Thank you!  Clearnce Curia

## 2023-12-20 ENCOUNTER — Ambulatory Visit

## 2023-12-20 VITALS — BP 126/64 | HR 68 | Ht 72.0 in | Wt 149.8 lb

## 2023-12-20 DIAGNOSIS — I10 Essential (primary) hypertension: Secondary | ICD-10-CM

## 2023-12-20 DIAGNOSIS — I480 Paroxysmal atrial fibrillation: Secondary | ICD-10-CM

## 2023-12-20 DIAGNOSIS — F419 Anxiety disorder, unspecified: Secondary | ICD-10-CM

## 2023-12-20 DIAGNOSIS — E782 Mixed hyperlipidemia: Secondary | ICD-10-CM | POA: Diagnosis not present

## 2023-12-20 DIAGNOSIS — Z9289 Personal history of other medical treatment: Secondary | ICD-10-CM

## 2023-12-20 DIAGNOSIS — I5022 Chronic systolic (congestive) heart failure: Secondary | ICD-10-CM | POA: Diagnosis not present

## 2023-12-20 DIAGNOSIS — Z950 Presence of cardiac pacemaker: Secondary | ICD-10-CM

## 2023-12-20 DIAGNOSIS — Z83719 Family history of colon polyps, unspecified: Secondary | ICD-10-CM

## 2023-12-20 DIAGNOSIS — I2510 Atherosclerotic heart disease of native coronary artery without angina pectoris: Secondary | ICD-10-CM | POA: Diagnosis not present

## 2023-12-20 HISTORY — DX: Presence of cardiac pacemaker: Z95.0

## 2023-12-20 MED ORDER — DAPAGLIFLOZIN PROPANEDIOL 10 MG PO TABS: 10.0000 mg | ORAL_TABLET | Freq: Every day | ORAL | 2 refills | Status: AC

## 2023-12-20 MED ORDER — SPIRONOLACTONE 25 MG PO TABS: 12.5000 mg | ORAL_TABLET | Freq: Every day | ORAL | 2 refills | Status: AC

## 2023-12-20 MED ORDER — CARVEDILOL 3.125 MG PO TABS: 3.1250 mg | ORAL_TABLET | Freq: Two times a day (BID) | ORAL | 2 refills | Status: AC

## 2023-12-20 MED ORDER — ROSUVASTATIN CALCIUM 20 MG PO TABS: 20.0000 mg | ORAL_TABLET | Freq: Every day | ORAL | 2 refills | Status: DC

## 2023-12-20 NOTE — Assessment & Plan Note (Signed)
   Euvolemic, compensated. NYHA class I.  Continuing directed medical therapy continues on carvedilol  3.125 mg twice daily Continue dapagliflozin  10 mg once daily reviewed potential risk for urinary tract infections and perineal infections and he is diligent about personal hygiene. Continue spironolactone  12.5 mg once daily Unable to add ACE inhibitor/ARB/Entresto due to reported allergy to lisinopril  which he is unsure but reported swelling.  Unclear if this was angioedema.  Advised to continue monitoring for any signs of fluid retention and advised to keep salt intake below 2 g/day.

## 2023-12-20 NOTE — Assessment & Plan Note (Addendum)
 Continue rosuvastatin  20 mg once daily. Lipid panel from 10-25-2023 total cholesterol 141, HDL 36, LDL 87, triglycerides 89.  Will recheck lipid panel at next follow-up visit tentatively in 3 months and if LDL remains above 70, will escalate rosuvastatin  dose.

## 2023-12-20 NOTE — Assessment & Plan Note (Signed)
 Paroxysmal A-fib 3% burden on last device check in April 2025.  Currently not on anticoagulation due to history of significant GI bleed previously while on Xarelto. Continue with aspirin  81 mg once daily. Encouraged him to be abstaining from alcohol, cocaine, smoking tobacco.  If he is adherent with medications consistently and willing to have close follow-up with PCP we can consider retrying anticoagulant.

## 2023-12-20 NOTE — Patient Instructions (Signed)
 Medication Instructions:  Your physician recommends that you continue on your current medications as directed. Please refer to the Current Medication list given to you today.  *If you need a refill on your cardiac medications before your next appointment, please call your pharmacy*   Lab Work: None Ordered If you have labs (blood work) drawn today and your tests are completely normal, you will receive your results only by: MyChart Message (if you have MyChart) OR A paper copy in the mail If you have any lab test that is abnormal or we need to change your treatment, we will call you to review the results.   Testing/Procedures: None Ordered   Follow-Up: At [facility], you and your health needs are our priority.  As part of our continuing mission to provide you with exceptional heart care, we have created designated Provider Care Teams.  These Care Teams include your primary Cardiologist (physician) and Advanced Practice Providers (APPs -  Physician Assistants and Nurse Practitioners) who all work together to provide you with the care you need, when you need it.  We recommend signing up for the patient portal called "MyChart".  Sign up information is provided on this After Visit Summary.  MyChart is used to connect with patients for Virtual Visits (Telemedicine).  Patients are able to view lab/test results, encounter notes, upcoming appointments, etc.  Non-urgent messages can be sent to your provider as well.   To learn more about what you can do with MyChart, go to [email or web address].    Your next appointment:   3 month(s)  The format for your next appointment:   In Person  Provider:      Other Instructions NA

## 2023-12-20 NOTE — Assessment & Plan Note (Signed)
 Last device check December 12, 2023 with normal functioning device.  A-fib burden 3%. Continue follow-up with device clinic as scheduled.

## 2023-12-20 NOTE — Assessment & Plan Note (Signed)
 Well-controlled. Continue current doses of metoprolol , spironolactone .

## 2023-12-20 NOTE — Assessment & Plan Note (Signed)
 Continue with aspirin  81 mg once daily. Continue with rosuvastatin  20 mg once daily.  Denies any anginal symptoms at this time. Emphasized the need to be abstaining from cocaine, alcohol and smoking cigarettes.

## 2023-12-20 NOTE — Progress Notes (Signed)
 Cardiology Consultation:    Date:  12/20/2023   ID:  Austin Espinoza, DOB Jul 09, 1956, MRN [id number]  PCP:  Verdia Glad, NP  Cardiologist:  Daymon Evans Lexa Coronado, MD   Referring MD: Verdia Glad, NP   No chief complaint on file.    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  Problem List Items Addressed This Visit     CAD (coronary artery disease) - Primary   Continue with aspirin  81 mg once daily. Continue with rosuvastatin  20 mg once daily.  Denies any anginal symptoms at this time. Emphasized the need to be abstaining from cocaine, alcohol and smoking cigarettes.       Relevant Medications   rosuvastatin  (CRESTOR ) 20 MG tablet   spironolactone  (ALDACTONE ) 25 MG tablet   carvedilol  (COREG ) 3.125 MG tablet   Hyperlipidemia   Continue rosuvastatin  20 mg once daily. Lipid panel from 10-25-2023 total cholesterol 141, HDL 36, LDL 87, triglycerides 89.  Will recheck lipid panel at next follow-up visit tentatively in 3 months and if LDL remains above 70, will escalate rosuvastatin  dose.      Relevant Medications   rosuvastatin  (CRESTOR ) 20 MG tablet   spironolactone  (ALDACTONE ) 25 MG tablet   carvedilol  (COREG ) 3.125 MG tablet   Hypertension   Well-controlled. Continue current doses of metoprolol , spironolactone .       Relevant Medications   rosuvastatin  (CRESTOR ) 20 MG tablet    spironolactone  (ALDACTONE ) 25 MG tablet   carvedilol  (COREG ) 3.125 MG tablet   Chronic HFrEF (heart failure with reduced ejection fraction) (HCC)     Euvolemic, compensated. NYHA class I.  Continuing directed medical therapy continues on carvedilol  3.125 mg twice daily Continue dapagliflozin  10 mg once daily reviewed potential risk for urinary tract infections and perineal infections and he is diligent about personal hygiene. Continue spironolactone  12.5 mg once daily Unable to add ACE inhibitor/ARB/Entresto due to reported allergy to lisinopril  which he is unsure but reported swelling.  Unclear if this was angioedema.  Advised to continue monitoring for any signs of fluid retention and advised to keep salt intake below 2 g/day.      Relevant Medications   rosuvastatin  (CRESTOR ) 20 MG tablet   spironolactone  (ALDACTONE ) 25 MG tablet   carvedilol  (COREG ) 3.125 MG tablet   Paroxysmal atrial fibrillation (HCC)   Paroxysmal A-fib 3% burden on last device check in April 2025.  Currently not on anticoagulation due to history of significant GI bleed previously while on Xarelto. Continue with aspirin  81 mg once daily. Encouraged him to be abstaining from alcohol, cocaine, smoking tobacco.  If he is adherent with medications consistently and willing to have close follow-up with PCP we can consider retrying anticoagulant.      Relevant Medications   rosuvastatin  (CRESTOR ) 20 MG tablet   spironolactone  (ALDACTONE ) 25 MG tablet   carvedilol  (COREG ) 3.125 MG tablet   S/P placement of cardiac pacemaker   Last device check December 12, 2023 with normal functioning device.  A-fib burden 3%. Continue follow-up with device clinic as scheduled.       Return to clinic tentatively in 3 months.  History of Present Illness:    Austin Espinoza is a 68 y.o. male who is being seen today for follow-up visit. PCP is Verdia Glad, NP. Last visit with me was in the office was read as 02-2024 with  Pattricia Bores, NP-C.  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   Here for the visit today accompanied by his sister.  He lives at home with his other brother.  His another sister helps him out with arranging his medications.  Mentions he has been feeling well.  His wound from pacemaker implant has healed up. Denies any chest pain, shortness of breath, orthopnea, paroxysmal nocturnal dyspnea.  Denies any palpitations.  Denies any lightheadedness.  Uses a cane to ambulate to help with balance.  Denies any mechanical falls.  Has cut down smoking cigarettes and smoked about 4 cigarettes in the past week. Has not used cocaine.  Mentions friends or family members at home does continue to use cocaine around his vicinity. Mentions he has not been drinking alcohol.  Good compliance with medications. He is unable to tell me what kind of allergic reaction he has had to lisinopril  in the past.  He is unable to recollect what kind of reaction he is active anticoagulation in the past but remains off anticoagulation at this time.  Last device check December 12, 2023 with atrial fibrillation burden up to 3%.  Normal device functioning.  Past Medical History:  Diagnosis Date   Abnormal myocardial perfusion study 04/15/2017   AKI (acute kidney injury) (HCC) 10/28/2023   Allergy    Anemia    Anxiety    CAD (coronary artery disease)    Bare-metal stent, LAD, 2007, total RCA, right to right  collaterals   Chronic HFrEF (heart failure with reduced ejection fraction) (HCC) 10/27/2023   Claudication (HCC)    April, 2013     Critical lower limb ischemia (HCC) 01/04/2015   Dyslipidemia    Ejection fraction < 50%    EF 40% in the past   Family history of colonic polyps    H/O heart artery stent 04/15/2017   Hemoptysis 05/23/2018   History of blood transfusion 01/29/2015   post op   Hyperlipemia    Hyperlipidemia    Hypertension    Hypertensive heart disease with chronic systolic congestive heart failure (HCC) 01/14/2017   Idiopathic gout, left ankle and foot 02/13/2016   Iron deficiency anemia due to chronic blood loss 12/22/2017   Ischemic cardiomyopathy 10/27/2023   Myocardial infarction [facility])    Non-rheumatic mitral regurgitation 01/14/2017   Moderate     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 Abel Hoe, Jan 19, 2012. CTA with  severe right CIA stenosis, bil SFA.    Paresthesia of skin 02/13/2016   Paroxysmal atrial fibrillation (HCC) 10/27/2023   Polysubstance abuse (HCC) 10/27/2023   Pre-operative cardiovascular examination 01/04/2015   Patient will require vascular procedures in the near future.     Sinus node dysfunction (HCC) 10/27/2023   Skin lesions, generalized 12/28/2012   Mild skin lesions inner aspect of both thighs     Syncope and collapse 10/25/2023   Tobacco abuse    Type 2 diabetes mellitus with complication, without long-term current use of insulin  (HCC) 10/28/2023   Type II diabetes mellitus (HCC)     Past Surgical History:  Procedure Laterality Date   ABDOMINAL AORTAGRAM N/A 04/06/2012   Procedure: ABDOMINAL Tommi Fraise;  Surgeon: Odie Benne, MD;  Location: [facility] CATH LAB;  Service: Cardiovascular;  Laterality: N/A;   ABDOMINAL AORTOGRAM  W/LOWER EXTREMITY N/A 12/31/2016   Procedure: Abdominal Aortogram w/Lower Extremity;  Surgeon: Arvil Lauber, MD;  Location: [facility] INVASIVE CV LAB;  Service: Cardiovascular;  Laterality: N/A;   COLONOSCOPY W/ POLYPECTOMY  11/21/2015   Colonic polyps status post polypectomy. Mild sigmoid diverticulosis   CORONARY ANGIOPLASTY WITH STENT PLACEMENT     2008, 07/19/2015   ENDARTERECTOMY FEMORAL Bilateral 01/29/2015   Procedure: ENDARTERECTOMY RIGHT FEMORAL ARTERY AND LEFT FEMORAL ARTERY;  Surgeon: Arvil Lauber, MD;  Location: [facility] OR;  Service: Vascular;  Laterality: Bilateral;   FEMORAL-FEMORAL BYPASS GRAFT Bilateral 01/29/2015   Procedure: BYPASS GRAFT LEFT FEMORAL ARTERY TO RIGHT FEMORAL ARTERY USING GORE PROPATEN GRAFT;  Surgeon: Arvil Lauber, MD;  Location: [facility] OR;  Service: Vascular;  Laterality: Bilateral;   INTRAOPERATIVE ARTERIOGRAM Left 01/29/2015   Procedure: INTRA OPERATIVE ARTERIOGRAM, LEFT ILIAC ARTERY;  Surgeon: Arvil Lauber, MD;  Location: [facility] OR;  Service: Vascular;  Laterality: Left;   PACEMAKER IMPLANT N/A 10/26/2023   Procedure: PACEMAKER IMPLANT;  Surgeon: Tammie Fall, MD;  Location: [facility] INVASIVE CV LAB;  Service: Cardiovascular;  Laterality: N/A;   PATCH ANGIOPLASTY Left 01/29/2015   Procedure: LEFT FEMORAL ARTERY PATCH ANGIOPLASTY USING VASCUGUARD PATCH;  Surgeon: Arvil Lauber, MD;  Location: [facility] OR;  Service: Vascular;  Laterality: Left;   PERIPHERAL VASCULAR BALLOON ANGIOPLASTY Left 12/31/2016   Procedure: Peripheral Vascular Balloon Angioplasty;  Surgeon: Arvil Lauber, MD;  Location: [facility] INVASIVE CV LAB;  Service: Cardiovascular;  Laterality: Left;  lt ext iliac   PERIPHERAL VASCULAR CATHETERIZATION N/A 01/14/2015   Procedure: Abdominal Aortogram;  Surgeon: Arvil Lauber, MD;  Location: [facility] INVASIVE CV LAB;  Service: Cardiovascular;  Laterality: N/A;   PERIPHERAL VASCULAR CATHETERIZATION Bilateral 01/14/2015   Procedure: Lower Extremity Angiography;  Surgeon: Arvil Lauber, MD;  Location: [facility]  INVASIVE CV LAB;  Service: Cardiovascular;  Laterality: Bilateral;   PERIPHERAL VASCULAR CATHETERIZATION N/A 07/18/2015   Procedure: Abdominal Aortogram;  Surgeon: Arvil Lauber, MD;  Location: [facility] INVASIVE CV LAB;  Service: Cardiovascular;  Laterality: N/A;   TEMPORARY PACEMAKER N/A 10/25/2023   Procedure: TEMPORARY PACEMAKER;  Surgeon: Mardell Shade, MD;  Location: [facility] INVASIVE CV LAB;  Service: Cardiovascular;  Laterality: N/A;   THROMBECTOMY FEMORAL ARTERY Left 01/29/2015   Procedure: THROMBECTOMY LEFT FEMORAL ARTERY;  Surgeon: Arvil Lauber, MD;  Location: [facility] OR;  Service: Vascular;  Laterality: Left;    Current Medications: Current Meds  Medication Sig   aspirin  EC 81 MG tablet Take 81 mg by mouth daily.   CHOLECALCIFEROL PO Take 1 tablet by mouth every evening.   DUPIXENT 300 MG/2ML SOAJ  Inject 1 each into the skin every 14 (fourteen) days.   ferrous sulfate  325 (65 FE) MG tablet Take 1 tablet (325 mg total) by mouth daily with breakfast.   gabapentin  (NEURONTIN ) 300 MG capsule Take 300 mg by mouth 2 (two) times daily.   metFORMIN (GLUCOPHAGE) 500 MG tablet Take 500 mg by mouth 2 (two) times daily with a meal.    nitroGLYCERIN  (NITROSTAT ) 0.4 MG SL tablet Place 1 tablet (0.4 mg total) under the tongue every 5 (five) minutes as needed for chest pain.   [DISCONTINUED] carvedilol  (COREG ) 3.125 MG tablet Take 1 tablet (3.125 mg total) by mouth 2 (two) times daily with a meal.   [DISCONTINUED] dapagliflozin  propanediol (FARXIGA ) 10 MG TABS tablet Take 1 tablet (10 mg total) by mouth daily.   [DISCONTINUED] rosuvastatin  (CRESTOR ) 20 MG tablet Take 1 tablet (20 mg total) by mouth daily.   [DISCONTINUED] spironolactone  (ALDACTONE ) 25 MG tablet Take 0.5 tablets (12.5 mg total) by mouth daily.     Allergies:   Ibuprofen and Lisinopril    Social History   Socioeconomic History   Marital status: Divorced    Spouse name: Not on file   Number of children: 0   Years of education: Not on file    Highest education level: Not on file  Occupational History   Occupation: Works at [employer] 3rd shift   Tobacco Use   Smoking status: Light Smoker    Current packs/day: 0.25    Average packs/day: 0.3 packs/day for 45.0 years (11.3 ttl pk-yrs)    Types: Cigarettes   Smokeless tobacco: Never   Tobacco comments:    1/2 pk per day  Vaping Use   Vaping status: Never Used  Substance and Sexual Activity   Alcohol use: No    Alcohol/week: 0.0 standard drinks of alcohol   Drug use: No   Sexual activity: Not Currently  Other Topics Concern   Not on file  Social History Narrative   Not on file   Social Drivers of Health   Financial Resource Strain: Not on file  Food Insecurity: No Food Insecurity (10/26/2023)   Hunger Vital Sign    Worried About Running Out of Food in the Last Year: Never true    Ran Out of Food in the Last Year: Never true  Transportation Needs: No Transportation Needs (10/26/2023)   PRAPARE - Administrator, Civil Service (Medical): No    Lack of Transportation (Non-Medical): No  Physical Activity: Not on file  Stress: Not on file  Social Connections: Patient Declined (10/27/2023)   Social Connection and Isolation Panel [NHANES]    Frequency of Communication with Friends and Family: Patient declined    Frequency of Social Gatherings with Friends and Family: Patient declined    Attends Religious Services: Patient declined    Database administrator or Organizations: Patient declined    Attends Banker Meetings: Patient declined    Marital Status: Patient declined     Family History: The patient's family history includes Diabetes in his mother; Heart attack in his father; Heart disease in his father; Hypertension in his sister; Throat cancer in his brother. There is no history of Colon cancer, Esophageal cancer, Rectal cancer, or Stomach cancer. ROS:   Please see the history of present illness.    All 14 point review of systems negative  except as described per history of present illness.  EKGs/Labs/Other Studies Reviewed:    The following studies were reviewed today:  EKG:       Recent Labs: 10/25/2023: ALT 39; B Natriuretic Peptide 2,096.6; TSH 1.210 10/28/2023: BUN 18; Creatinine, Ser 1.27; Hemoglobin 10.8; Magnesium  2.3; Platelets 181; Potassium 3.9; Sodium 139  Recent Lipid Panel    Component Value Date/Time   CHOL 141 10/25/2023 1427   TRIG 89 10/25/2023 1427   HDL 36 (L) 10/25/2023 1427   CHOLHDL 3.9 10/25/2023 1427   VLDL 18 10/25/2023 1427   LDLCALC 87 10/25/2023 1427    Physical Exam:    VS:  BP 126/64   Pulse 68   Ht 6' (1.829 m)   Wt 149 lb 12.8 oz (67.9 kg)   SpO2 99%   BMI 20.32 kg/m     Wt Readings from Last 3 Encounters:  12/20/23 149 lb 12.8 oz (67.9 kg)  11/05/23 151 lb (68.5 kg)  10/28/23 150 lb 3.2 oz (68.1 kg)     GENERAL:  Well nourished, well developed in no acute distress NECK: No JVD; No carotid bruits CARDIAC: RRR, S1 and S2 present, no murmurs, no rubs, no gallops CHEST:  Clear to auscultation without rales, wheezing or rhonchi  Extremities: No pitting pedal edema. Pulses bilaterally symmetric with radial 2+ and dorsalis pedis 2+ NEUROLOGIC:  Alert and oriented x 3  Medication Adjustments/Labs and Tests Ordered: Current medicines are reviewed at length with the patient today.  Concerns regarding medicines are outlined above.  No orders of the defined types were placed in this encounter.  Meds ordered this encounter  Medications   dapagliflozin  propanediol (FARXIGA ) 10 MG TABS tablet    Sig: Take 1 tablet (10 mg total) by mouth daily.    Dispense:  90 tablet    Refill:  2   rosuvastatin  (CRESTOR ) 20 MG tablet    Sig: Take 1 tablet (20 mg total) by mouth daily.    Dispense:  90 tablet    Refill:  2   spironolactone  (ALDACTONE ) 25 MG tablet    Sig: Take 0.5 tablets (12.5 mg total) by mouth daily.    Dispense:  45 tablet    Refill:  2   carvedilol  (COREG ) 3.125 MG  tablet    Sig: Take 1 tablet (3.125 mg total) by mouth 2 (two) times daily with a meal.    Dispense:  180 tablet    Refill:  2    Signed, Oberon Hehir reddy Zalayah Pizzuto, MD, MPH, [facility] 12/20/2023 11:53 AM    [facility]

## 2024-01-16 NOTE — Progress Notes (Signed)
 Electrophysiology Office Note:   Date:  01/17/2024  ID:  Austin Espinoza, DOB Oct 29, 1955, MRN [id number]  Primary Cardiologist: Daymon Evans Madireddy, MD Primary Heart Failure: None Electrophysiologist: Ashar Lewinski Cortland Ding, MD      History of Present Illness:   Austin Espinoza is a 68 y.o. male with h/o coronary artery disease, hypertension, hyperlipidemia, PAD, chronic systolic heart failure due to ischemic cardiomyopathy, atrial fibrillation, polysubstance abuse, diabetes seen today for routine electrophysiology followup.   Since last being seen in our clinic the patient reports doing well.  He has no chest pain or shortness of breath.  He is able to all of his daily activities without restriction.  He is concerned about his low blood pressure.  The number is low, but he is unaware of symptoms.  He has no dizziness, near syncope, presyncope.  he denies chest pain, palpitations, dyspnea, PND, orthopnea, nausea, vomiting, dizziness, syncope, edema, weight gain, or early satiety.   Review of systems complete and found to be negative unless listed in HPI.      EP Information / Studies Reviewed:    EKG is ordered today. Personal review as below.  EKG Interpretation Date/Time:  Monday Jan 17 2024 11:13:42 EDT Ventricular Rate:  79 PR Interval:  200 QRS Duration:  104 QT Interval:  356 QTC Calculation: 408 R Axis:   74  Text Interpretation: Normal sinus rhythm Left ventricular hypertrophy with repolarization abnormality ( Sokolow-Lyon ) When compared with ECG of 28-Oct-2023 10:11, Minimal criteria for Inferior infarct are no longer Present ST now depressed in Inferior leads Confirmed by Mykenzi Vanzile ([id number]) on 01/17/2024 11:19:55 AM   PPM Interrogation-  reviewed in detail today,  See PACEART report.  Device History: Medtronic Dual Chamber PPM implanted 10/26/23 for Sinus Node Dysfunction  Risk Assessment/Calculations:    CHA2DS2-VASc Score =     This indicates a  % annual risk of  stroke. The patient's score is based upon:              Physical Exam:   VS:  BP 98/60   Pulse 79   Ht 6' (1.829 m)   Wt 143 lb 3.2 oz (65 kg)   SpO2 97%   BMI 19.42 kg/m    Wt Readings from Last 3 Encounters:  01/17/24 143 lb 3.2 oz (65 kg)  12/20/23 149 lb 12.8 oz (67.9 kg)  11/05/23 151 lb (68.5 kg)     GEN: Well nourished, well developed in no acute distress NECK: No JVD; No carotid bruits CARDIAC: Regular rate and rhythm, no murmurs, rubs, gallops RESPIRATORY:  Clear to auscultation without rales, wheezing or rhonchi  ABDOMEN: Soft, non-tender, non-distended EXTREMITIES:  No edema; No deformity   ASSESSMENT AND PLAN:    SND s/p Medtronic PPM  Normal PPM function Sensing, threshold, impedance within normal limits Device programming reviewed and appropriate See Pace Art report No changes today  2.  Coronary artery disease: No current chest pain.  Plan per primary cardiology.  3.  Hypertension: Low today.  Continue heart failure medications.  4.  Chronic systolic heart failure: On medical therapy per primary cardiology.  Was thought not a good candidate for ICD therapy due to polysubstance abuse.  If his ejection fraction does not improve and he remains off illicit drugs, could potentially consider device upgrade.  5.  Paroxysmal atrial fibrillation: 3% burden on device check.  No anticoagulation due to history of GI bleeding while on Xarelto.  Disposition:   Follow up with Dr. Lawana Pray  in 12 months  Signed, Kyung Muto Cortland Ding, MD

## 2024-01-17 ENCOUNTER — Ambulatory Visit: Attending: Cardiology | Admitting: Cardiology

## 2024-01-17 ENCOUNTER — Encounter: Payer: Self-pay | Admitting: Cardiology

## 2024-01-17 VITALS — BP 98/60 | HR 79 | Ht 72.0 in | Wt 143.2 lb

## 2024-01-17 DIAGNOSIS — I495 Sick sinus syndrome: Secondary | ICD-10-CM | POA: Diagnosis not present

## 2024-01-17 DIAGNOSIS — I5022 Chronic systolic (congestive) heart failure: Secondary | ICD-10-CM | POA: Diagnosis not present

## 2024-01-17 LAB — CUP PACEART INCLINIC DEVICE CHECK
Date Time Interrogation Session: 20250519193413
Implantable Lead Connection Status: 753985
Implantable Lead Connection Status: 753985
Implantable Lead Implant Date: 20250225
Implantable Lead Implant Date: 20250225
Implantable Lead Location: 753859
Implantable Lead Location: 753860
Implantable Lead Model: 3830
Implantable Lead Model: 5076
Implantable Pulse Generator Implant Date: 20250225

## 2024-01-17 NOTE — Patient Instructions (Signed)
 Medication Instructions:  Your physician recommends that you continue on your current medications as directed. Please refer to the Current Medication list given to you today.  *If you need a refill on your cardiac medications before your next appointment, please call your pharmacy*  Lab Work: None ordered  Testing/Procedures: None ordered  Follow-Up: At [facility], you and your health needs are our priority.  As part of our continuing mission to provide you with exceptional heart care, our providers are all part of one team.  This team includes your primary Cardiologist (physician) and Advanced Practice Providers or APPs (Physician Assistants and Nurse Practitioners) who all work together to provide you with the care you need, when you need it.  Your next appointment:   1 year(s)  Provider:   Agatha Horsfall, MD    We recommend signing up for the patient portal called "MyChart".  Sign up information is provided on this After Visit Summary.  MyChart is used to connect with patients for Virtual Visits (Telemedicine).  Patients are able to view lab/test results, encounter notes, upcoming appointments, etc.  Non-urgent messages can be sent to your provider as well.   To learn more about what you can do with MyChart, go to [email or web address].   Thank you for choosing [facility]!!   Reece Cane, RN [phone]

## 2024-01-20 ENCOUNTER — Ambulatory Visit: Payer: Self-pay | Admitting: Cardiology

## 2024-01-26 ENCOUNTER — Encounter: Payer: 59 | Admitting: Student

## 2024-01-27 NOTE — Progress Notes (Signed)
 Remote pacemaker transmission.

## 2024-01-27 NOTE — Addendum Note (Signed)
 Addended by: Edra Govern D on: 01/27/2024 09:59 AM   Modules accepted: Orders

## 2024-02-21 ENCOUNTER — Ambulatory Visit

## 2024-02-21 DIAGNOSIS — I495 Sick sinus syndrome: Secondary | ICD-10-CM | POA: Diagnosis not present

## 2024-02-22 LAB — CUP PACEART REMOTE DEVICE CHECK
Battery Remaining Longevity: 179 mo
Battery Voltage: 3.22 V
Brady Statistic AP VP Percent: 0.01 %
Brady Statistic AP VS Percent: 13.3 %
Brady Statistic AS VP Percent: 0.02 %
Brady Statistic AS VS Percent: 86.67 %
Brady Statistic RA Percent Paced: 13.57 %
Brady Statistic RV Percent Paced: 0.03 %
Date Time Interrogation Session: 20250623102340
Implantable Lead Connection Status: 753985
Implantable Lead Connection Status: 753985
Implantable Lead Implant Date: 20250225
Implantable Lead Implant Date: 20250225
Implantable Lead Location: 753859
Implantable Lead Location: 753860
Implantable Lead Model: 3830
Implantable Lead Model: 5076
Implantable Pulse Generator Implant Date: 20250225
Lead Channel Impedance Value: 266 Ohm
Lead Channel Impedance Value: 323 Ohm
Lead Channel Impedance Value: 475 Ohm
Lead Channel Impedance Value: 513 Ohm
Lead Channel Pacing Threshold Amplitude: 0.625 V
Lead Channel Pacing Threshold Amplitude: 0.625 V
Lead Channel Pacing Threshold Pulse Width: 0.4 ms
Lead Channel Pacing Threshold Pulse Width: 0.4 ms
Lead Channel Sensing Intrinsic Amplitude: 1.625 mV
Lead Channel Sensing Intrinsic Amplitude: 1.625 mV
Lead Channel Sensing Intrinsic Amplitude: 5.125 mV
Lead Channel Sensing Intrinsic Amplitude: 5.125 mV
Lead Channel Setting Pacing Amplitude: 1.5 V
Lead Channel Setting Pacing Amplitude: 2 V
Lead Channel Setting Pacing Pulse Width: 0.4 ms
Lead Channel Setting Sensing Sensitivity: 1.2 mV
Zone Setting Status: 755011

## 2024-02-24 ENCOUNTER — Ambulatory Visit: Payer: Self-pay | Admitting: Cardiology

## 2024-03-14 ENCOUNTER — Ambulatory Visit: Admitting: Podiatry

## 2024-03-21 ENCOUNTER — Ambulatory Visit

## 2024-03-21 DIAGNOSIS — E785 Hyperlipidemia, unspecified: Secondary | ICD-10-CM | POA: Insufficient documentation

## 2024-03-21 DIAGNOSIS — E119 Type 2 diabetes mellitus without complications: Secondary | ICD-10-CM | POA: Insufficient documentation

## 2024-03-21 DIAGNOSIS — I219 Acute myocardial infarction, unspecified: Secondary | ICD-10-CM | POA: Insufficient documentation

## 2024-03-22 ENCOUNTER — Ambulatory Visit

## 2024-03-22 VITALS — BP 136/70 | HR 60 | Ht 69.0 in | Wt 153.2 lb

## 2024-03-22 DIAGNOSIS — E782 Mixed hyperlipidemia: Secondary | ICD-10-CM | POA: Diagnosis not present

## 2024-03-22 DIAGNOSIS — I2510 Atherosclerotic heart disease of native coronary artery without angina pectoris: Secondary | ICD-10-CM | POA: Diagnosis not present

## 2024-03-22 DIAGNOSIS — I5022 Chronic systolic (congestive) heart failure: Secondary | ICD-10-CM

## 2024-03-22 DIAGNOSIS — I480 Paroxysmal atrial fibrillation: Secondary | ICD-10-CM

## 2024-03-22 NOTE — Progress Notes (Signed)
 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   Problem List Items Addressed This Visit     CAD (coronary artery disease)   Continue aspirin  81 mg once daily Continue rosuvastatin  20 mg once daily. No anginal symptoms.       Chronic HFrEF (heart failure with reduced ejection fraction) (HCC) - Primary   Reduced LVEF on last echocardiogram from February 2025 EF 25 to 30%. Euvolemic, compensated.  Continue guideline directed medical therapy Carvedilol  3.125 mg twice daily Farxiga  10 mg once daily Continue spironolactone  12.5 mg once daily. Not on ACE inhibitor/ARB/or Entresto due to history of allergic reaction to lisinopril  with swelling of the throat. .  Currently not on any loop diuretic. Continue with salt and fluid restriction      Relevant Orders   CBC with Differential/Platelet   Comprehensive metabolic panel with GFR   ECHOCARDIOGRAM COMPLETE   Paroxysmal atrial fibrillation (HCC)   6.8% burden of A-fib on last device check 02/21/2024. Asymptomatic.  Currently not on  anticoagulation. Reports no bleeding issues at this time. Will check baseline CBC. Continue with aspirin  81 mg,.  Would recommend further discussion with PCP for any significant bleeding concerns. If there are no active bleeding concerns I would strongly encourage putting him back on anticoagulation preferably with Eliquis 5 mg twice daily.      Relevant Orders   Comprehensive metabolic panel with GFR   Hyperlipemia   Currently on rosuvastatin  40 mg once daily. Repeat lipid panel. If LDL not at goal below 70 mg/dL titrate up rosuvastatin  to 40 mg. Will also obtain baseline CMP.       Relevant Orders   Lipid panel    Return to clinic for follow-up in 4 months  History of Present Illness:    Austin Espinoza is a 68 y.o. male who is being seen today for t follow-up visit. PCP is Zachary Lamar BRAVO, NP. Last visit with me in the office was 12-20-2023. Also follows up with Dr. Inocencio last follow-up was 01/17/2024   history of CAD NSTEMI in June/2007 s/p bare-metal stent to LAD and RCA noted to be CTO, symptomatic bradycardia with sinus pauses s/p permanent pacemaker implantation February 2025, chronic heart failure with reduced ejection fraction more recently LVEF 25 to 30% February 2025, moderate MR, mild to moderate TR, paroxysmal atrial fibrillation not on anticoagulation due to prior history of hemoptysis and anemia while on Xarelto, peripheral arterial disease s/p balloon angioplasty of the left iliac system February 2018, history of cocaine abuse, tobacco abuse and working on cutting down, quit alcohol recently  He lives at home with his  other brother. His sister Elveria that accompanied him to the visit today helps him out with arranging his medications.  Uses a cane to ambulate.  Last device check 02/21/2024 paroxysmal A-fib 6.8% burden the longest episode lasting 1 hour 23 minutes.  Ventricular rates controlled while in A-fib.  14 beat run of high ventricular rate likely NSVT  observed.  Ventricular pacing less than 1%  Lipid panel from 10-25-2023 total cholesterol 141, HDL 36, LDL 87, triglycerides 89 .  No active symptoms.  Mentions he has been monitoring salt and fluid intake. No pedal edema.  Good compliance with medications.  He has quit using cocaine, alcohol, smoking.  Past Medical History:  Diagnosis Date   Abnormal myocardial perfusion study 04/15/2017   AKI (acute kidney injury) (HCC) 10/28/2023   Allergy    Anemia    Anxiety    CAD (coronary artery disease)    Bare-metal stent, LAD, 2007, total RCA, right to right collaterals   Chronic HFrEF (heart failure with reduced ejection fraction) (HCC) 10/27/2023   Claudication (HCC)    April, 2013     Critical lower limb ischemia (HCC) 01/04/2015   Dyslipidemia    Ejection fraction < 50%    EF 40% in the past   Family history of colonic polyps    H/O heart artery stent 04/15/2017   Hemoptysis 05/23/2018   History of blood transfusion 01/29/2015   post op   Hyperlipemia    Hyperlipidemia    Hypertension    Hypertensive heart disease with chronic systolic congestive heart failure (HCC) 01/14/2017   Idiopathic gout, left ankle and foot 02/13/2016   Iron deficiency anemia due to chronic blood loss 12/22/2017   Ischemic cardiomyopathy 10/27/2023   Myocardial infarction [facility])    Non-rheumatic mitral regurgitation 01/14/2017   Moderate     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 Verlin, Jan 19, 2012. CTA with severe right CIA stenosis, bil SFA.    Paresthesia of skin 02/13/2016   Paroxysmal atrial fibrillation (HCC) 10/27/2023   Polysubstance abuse (HCC) 10/27/2023   Pre-operative cardiovascular examination 01/04/2015   Patient will require vascular procedures in the near future.     S/P  placement of cardiac pacemaker 12/20/2023   Sinus node dysfunction (HCC) 10/27/2023   Skin lesions, generalized 12/28/2012   Mild skin lesions inner aspect of both thighs     Syncope and collapse 10/25/2023   Tobacco abuse    Type 2 diabetes mellitus with complication, without long-term current use of insulin  (HCC) 10/28/2023   Type II diabetes mellitus (HCC)     Past Surgical History:  Procedure Laterality Date   ABDOMINAL AORTAGRAM N/A 04/06/2012   Procedure: ABDOMINAL EZELLA;  Surgeon: Lonni JONETTA Verlin, MD;  Location: [facility] CATH LAB;  Service: Cardiovascular;  Laterality: N/A;   ABDOMINAL AORTOGRAM W/LOWER EXTREMITY N/A 12/31/2016   Procedure: Abdominal Aortogram w/Lower Extremity;  Surgeon: Redell LITTIE Door, MD;  Location: [facility] INVASIVE CV LAB;  Service: Cardiovascular;  Laterality: N/A;   COLONOSCOPY W/ POLYPECTOMY  11/21/2015   Colonic polyps status post polypectomy. Mild sigmoid diverticulosis   CORONARY ANGIOPLASTY WITH STENT PLACEMENT     2008, 07/19/2015   ENDARTERECTOMY FEMORAL Bilateral 01/29/2015   Procedure: ENDARTERECTOMY RIGHT FEMORAL ARTERY AND LEFT FEMORAL ARTERY;  Surgeon: Redell  LITTIE Door, MD;  Location: [facility] OR;  Service: Vascular;  Laterality: Bilateral;   FEMORAL-FEMORAL BYPASS GRAFT Bilateral 01/29/2015   Procedure: BYPASS GRAFT LEFT FEMORAL ARTERY TO RIGHT FEMORAL ARTERY USING GORE PROPATEN GRAFT;  Surgeon: Redell LITTIE Door, MD;  Location: [facility] OR;  Service: Vascular;  Laterality: Bilateral;   INTRAOPERATIVE ARTERIOGRAM Left 01/29/2015   Procedure: INTRA OPERATIVE ARTERIOGRAM, LEFT ILIAC ARTERY;  Surgeon: Redell LITTIE Door, MD;  Location: [facility] OR;  Service: Vascular;  Laterality: Left;   PACEMAKER IMPLANT N/A 10/26/2023   Procedure: PACEMAKER IMPLANT;  Surgeon: Waddell Danelle ORN, MD;  Location: [facility] INVASIVE CV LAB;  Service: Cardiovascular;  Laterality: N/A;   PATCH ANGIOPLASTY Left 01/29/2015   Procedure: LEFT FEMORAL ARTERY PATCH ANGIOPLASTY USING VASCUGUARD PATCH;  Surgeon: Redell LITTIE Door,  MD;  Location: [facility] OR;  Service: Vascular;  Laterality: Left;   PERIPHERAL VASCULAR BALLOON ANGIOPLASTY Left 12/31/2016   Procedure: Peripheral Vascular Balloon Angioplasty;  Surgeon: Redell LITTIE Door, MD;  Location: [facility] INVASIVE CV LAB;  Service: Cardiovascular;  Laterality: Left;  lt ext iliac   PERIPHERAL VASCULAR CATHETERIZATION N/A 01/14/2015   Procedure: Abdominal Aortogram;  Surgeon: Redell LITTIE Door, MD;  Location: [facility] INVASIVE CV LAB;  Service: Cardiovascular;  Laterality: N/A;   PERIPHERAL VASCULAR CATHETERIZATION Bilateral 01/14/2015   Procedure: Lower Extremity Angiography;  Surgeon: Redell LITTIE Door, MD;  Location: [facility] INVASIVE CV LAB;  Service: Cardiovascular;  Laterality: Bilateral;   PERIPHERAL VASCULAR CATHETERIZATION N/A 07/18/2015   Procedure: Abdominal Aortogram;  Surgeon: Redell LITTIE Door, MD;  Location: [facility] INVASIVE CV LAB;  Service: Cardiovascular;  Laterality: N/A;   TEMPORARY PACEMAKER N/A 10/25/2023   Procedure: TEMPORARY PACEMAKER;  Surgeon: Cherrie Toribio SAUNDERS, MD;  Location: [facility] INVASIVE CV LAB;  Service: Cardiovascular;  Laterality: N/A;   THROMBECTOMY FEMORAL ARTERY Left 01/29/2015   Procedure: THROMBECTOMY LEFT FEMORAL ARTERY;  Surgeon: Redell LITTIE Door, MD;  Location: [facility] OR;  Service: Vascular;  Laterality: Left;    Current Medications: Current Meds  Medication Sig   aspirin  EC 81 MG tablet Take 81 mg by mouth daily.   carvedilol  (COREG ) 3.125 MG tablet Take 1 tablet (3.125 mg total) by mouth 2 (two) times daily with a meal.   CHOLECALCIFEROL PO Take 1 tablet by mouth every evening.   dapagliflozin  propanediol (FARXIGA ) 10 MG TABS tablet Take 1 tablet (10 mg total) by mouth daily.   DUPIXENT 300 MG/2ML SOAJ Inject 1 each into the skin every 14 (fourteen) days.   ferrous sulfate  325 (65 FE) MG tablet Take 1 tablet (325 mg total) by mouth daily with breakfast.   gabapentin  (NEURONTIN ) 300 MG capsule Take 300 mg by mouth 2 (two) times daily.   metFORMIN (GLUCOPHAGE) 500 MG tablet Take 500 mg  by mouth 2 (two) times daily with a meal.    nitroGLYCERIN  (NITROSTAT ) 0.4 MG SL tablet Place 1 tablet (0.4 mg total) under the tongue every 5 (five) minutes as needed for chest pain.   rosuvastatin  (CRESTOR ) 20 MG tablet Take 1 tablet (20 mg total) by mouth daily.   spironolactone  (ALDACTONE ) 25 MG tablet Take 0.5 tablets (12.5 mg total) by mouth daily.     Allergies:   Ibuprofen and Lisinopril    Social History   Socioeconomic History   Marital status: Divorced    Spouse name: Not on file   Number of children: 0   Years of education: Not on file   Highest education level: Not on file  Occupational History   Occupation: Works at [employer] 3rd shift   Tobacco Use   Smoking status: Light Smoker    Current packs/day: 0.25    Average packs/day: 0.3 packs/day for 45.0 years (11.3 ttl pk-yrs)    Types: Cigarettes   Smokeless tobacco: Never   Tobacco comments:    1/2 pk per day  Vaping Use   Vaping status: Never Used  Substance and Sexual Activity   Alcohol use: No    Alcohol/week: 0.0 standard drinks of alcohol   Drug use: No   Sexual activity: Not Currently  Other Topics Concern   Not on file  Social History Narrative   Not on file   Social Drivers of Health   Financial Resource Strain: Not on file  Food Insecurity: No Food Insecurity (10/26/2023)   Hunger Vital Sign    Worried About Running Out of Food in the Last Year: Never true    Ran Out of Food in the Last Year: Never true  Transportation Needs: No Transportation Needs (10/26/2023)   PRAPARE - Administrator, Civil Service (Medical): No    Lack of Transportation (Non-Medical): No  Physical Activity: Not on file  Stress: Not on file  Social Connections: Patient Declined (10/27/2023)   Social Connection and Isolation Panel    Frequency of Communication with Friends and Family: Patient declined    Frequency of Social Gatherings with Friends and Family: Patient declined    Attends Religious Services: Patient  declined    Database administrator or Organizations: Patient declined    Attends Banker Meetings: Patient declined    Marital Status: Patient declined     Family History: The patient's family history includes Diabetes in his mother; Heart attack in his father; Heart disease in his father; Hypertension in his sister; Throat cancer in his brother. There is no history of Colon cancer, Esophageal cancer, Rectal cancer, or Stomach cancer. ROS:   Please see the history of present illness.    All 14 point review of systems negative except as described per history of present illness.  EKGs/Labs/Other Studies Reviewed:    The following studies were reviewed today:   EKG:       Recent Labs: 10/25/2023: ALT 39; B Natriuretic Peptide 2,096.6; TSH 1.210 10/28/2023: BUN 18; Creatinine, Ser 1.27; Hemoglobin 10.8; Magnesium  2.3; Platelets 181; Potassium 3.9; Sodium 139  Recent Lipid Panel    Component Value Date/Time   CHOL 141 10/25/2023 1427   TRIG 89 10/25/2023 1427   HDL 36 (L) 10/25/2023 1427   CHOLHDL 3.9 10/25/2023 1427   VLDL 18 10/25/2023 1427   LDLCALC 87 10/25/2023 1427    Physical Exam:    VS:  BP 136/70 (BP Location: Left Arm, Patient Position: Sitting)   Pulse 60   Ht 5' 9 (1.753 m)   Wt 153 lb 3.2 oz (69.5 kg)   SpO2 94%   BMI 22.62 kg/m     Wt Readings from Last 3 Encounters:  03/22/24 153 lb 3.2 oz (69.5 kg)  01/17/24 143 lb 3.2 oz (65 kg)  12/20/23 149 lb 12.8 oz (67.9 kg)     GENERAL:  Well nourished, well developed in no acute distress NECK: No JVD; No carotid bruits CARDIAC: RRR, S1 and S2 present, no murmurs, no rubs, no gallops CHEST:  Clear to auscultation without rales, wheezing or rhonchi  Extremities: No pitting pedal edema. Pulses bilaterally symmetric with radial 2+ and dorsalis pedis 2+ NEUROLOGIC:  Alert and oriented x 3  Medication Adjustments/Labs  and Tests Ordered: Current medicines are reviewed at length with the patient today.   Concerns regarding medicines are outlined above.  Orders Placed This Encounter  Procedures   CBC with Differential/Platelet   Comprehensive metabolic panel with GFR   Lipid panel   ECHOCARDIOGRAM COMPLETE   No orders of the defined types were placed in this encounter.   Signed, Alean jess Kobus, MD, MPH, [facility]. 03/22/2024 5:25 PM    [facility]

## 2024-03-22 NOTE — Assessment & Plan Note (Signed)
 Currently on rosuvastatin  40 mg once daily. Repeat lipid panel. If LDL not at goal below 70 mg/dL titrate up rosuvastatin  to 40 mg. Will also obtain baseline CMP.

## 2024-03-22 NOTE — Assessment & Plan Note (Signed)
 Continue aspirin  81 mg once daily Continue rosuvastatin  20 mg once daily. No anginal symptoms.

## 2024-03-22 NOTE — Patient Instructions (Addendum)
 Medication Instructions:  Your physician recommends that you continue on your current medications as directed. Please refer to the Current Medication list given to you today.  *If you need a refill on your cardiac medications before your next appointment, please call your pharmacy*   Lab Work: Your physician recommends that you have a CMP, CBC and lipids today in the office.  If you have labs (blood work) drawn today and your tests are completely normal, you will receive your results only by: MyChart Message (if you have MyChart) OR A paper copy in the mail If you have any lab test that is abnormal or we need to change your treatment, we will call you to review the results.  Testing/Procedures: Your physician has requested that you have an echocardiogram. Echocardiography is a painless test that uses sound waves to create images of your heart. It provides your doctor with information about the size and shape of your heart and how well your heart's chambers and valves are working. This procedure takes approximately one hour. There are no restrictions for this procedure. Please do NOT wear cologne, perfume, aftershave, or lotions (deodorant is allowed). Please arrive 15 minutes prior to your appointment time.  Please note: We ask at that you not bring children with you during ultrasound (echo/ vascular) testing. Due to room size and safety concerns, children are not allowed in the ultrasound rooms during exams. Our front office staff cannot provide observation of children in our lobby area while testing is being conducted. An adult accompanying a patient to their appointment will only be allowed in the ultrasound room at the discretion of the ultrasound technician under special circumstances. We apologize for any inconvenience.  Follow-Up: At [facility], you and your health needs are our priority.  As part of our continuing mission to provide you with exceptional heart care, we have created  designated Provider Care Teams.  These Care Teams include your primary Cardiologist (physician) and Advanced Practice Providers (APPs -  Physician Assistants and Nurse Practitioners) who all work together to provide you with the care you need, when you need it.  We recommend signing up for the patient portal called MyChart.  Sign up information is provided on this After Visit Summary.  MyChart is used to connect with patients for Virtual Visits (Telemedicine).  Patients are able to view lab/test results, encounter notes, upcoming appointments, etc.  Non-urgent messages can be sent to your provider as well.   To learn more about what you can do with MyChart, go to [email or web address].    Your next appointment:   4 month(s)  The format for your next appointment:   In Person  Provider:   Alean Madireddy, MD   Other Instructions Echocardiogram An echocardiogram is a test that uses sound waves (ultrasound) to produce images of the heart. Images from an echocardiogram can provide important information about: Heart size and shape. The size and thickness and movement of your heart's walls. Heart muscle function and strength. Heart valve function or if you have stenosis. Stenosis is when the heart valves are too narrow. If blood is flowing backward through the heart valves (regurgitation). A tumor or infectious growth around the heart valves. Areas of heart muscle that are not working well because of poor blood flow or injury from a heart attack. Aneurysm detection. An aneurysm is a weak or damaged part of an artery wall. The wall bulges out from the normal force of blood pumping through the body. Tell a health care  provider about: Any allergies you have. All medicines you are taking, including vitamins, herbs, eye drops, creams, and over-the-counter medicines. Any blood disorders you have. Any surgeries you have had. Any medical conditions you have. Whether you are pregnant or may  be pregnant. What are the risks? Generally, this is a safe test. However, problems may occur, including an allergic reaction to dye (contrast) that may be used during the test. What happens before the test? No specific preparation is needed. You may eat and drink normally. What happens during the test? You will take off your clothes from the waist up and put on a hospital gown. Electrodes or electrocardiogram (ECG)patches may be placed on your chest. The electrodes or patches are then connected to a device that monitors your heart rate and rhythm. You will lie down on a table for an ultrasound exam. A gel will be applied to your chest to help sound waves pass through your skin. A handheld device, called a transducer, will be pressed against your chest and moved over your heart. The transducer produces sound waves that travel to your heart and bounce back (or echo back) to the transducer. These sound waves will be captured in real-time and changed into images of your heart that can be viewed on a video monitor. The images will be recorded on a computer and reviewed by your health care provider. You may be asked to change positions or hold your breath for a short time. This makes it easier to get different views or better views of your heart. In some cases, you may receive contrast through an IV in one of your veins. This can improve the quality of the pictures from your heart. The procedure may vary among health care providers and hospitals.   What can I expect after the test? You may return to your normal, everyday life, including diet, activities, and medicines, unless your health care provider tells you not to do that. Follow these instructions at home: It is up to you to get the results of your test. Ask your health care provider, or the department that is doing the test, when your results will be ready. Keep all follow-up visits. This is important. Summary An echocardiogram is a test that  uses sound waves (ultrasound) to produce images of the heart. Images from an echocardiogram can provide important information about the size and shape of your heart, heart muscle function, heart valve function, and other possible heart problems. You do not need to do anything to prepare before this test. You may eat and drink normally. After the echocardiogram is completed, you may return to your normal, everyday life, unless your health care provider tells you not to do that. This information is not intended to replace advice given to you by your health care provider. Make sure you discuss any questions you have with your health care provider. Document Revised: 04/09/2020 Document Reviewed: 04/09/2020 Elsevier Patient Education  2021 [employer]   Important Information About Sugar

## 2024-03-22 NOTE — Assessment & Plan Note (Signed)
 6.8% burden of A-fib on last device check 02/21/2024. Asymptomatic.  Currently not on anticoagulation. Reports no bleeding issues at this time. Will check baseline CBC. Continue with aspirin  81 mg,.  Would recommend further discussion with PCP for any significant bleeding concerns. If there are no active bleeding concerns I would strongly encourage putting him back on anticoagulation preferably with Eliquis 5 mg twice daily.

## 2024-03-22 NOTE — Assessment & Plan Note (Addendum)
 Reduced LVEF on last echocardiogram from February 2025 EF 25 to 30%. Euvolemic, compensated.  Continue guideline directed medical therapy Carvedilol  3.125 mg twice daily Farxiga  10 mg once daily Continue spironolactone  12.5 mg once daily. Not on ACE inhibitor/ARB/or Entresto due to history of allergic reaction to lisinopril  with swelling of the throat. .  Currently not on any loop diuretic. Continue with salt and fluid restriction

## 2024-03-23 LAB — CBC WITH DIFFERENTIAL/PLATELET
Basophils Absolute: 0.1 x10E3/uL (ref 0.0–0.2)
Basos: 1 %
EOS (ABSOLUTE): 0.5 x10E3/uL — ABNORMAL HIGH (ref 0.0–0.4)
Eos: 8 %
Hematocrit: 41.7 % (ref 37.5–51.0)
Hemoglobin: 13.5 g/dL (ref 13.0–17.7)
Immature Grans (Abs): 0 x10E3/uL (ref 0.0–0.1)
Immature Granulocytes: 0 %
Lymphocytes Absolute: 1.3 x10E3/uL (ref 0.7–3.1)
Lymphs: 21 %
MCH: 29.9 pg (ref 26.6–33.0)
MCHC: 32.4 g/dL (ref 31.5–35.7)
MCV: 92 fL (ref 79–97)
Monocytes Absolute: 0.7 x10E3/uL (ref 0.1–0.9)
Monocytes: 12 %
Neutrophils Absolute: 3.6 x10E3/uL (ref 1.4–7.0)
Neutrophils: 58 %
Platelets: 186 x10E3/uL (ref 150–450)
RBC: 4.52 x10E6/uL (ref 4.14–5.80)
RDW: 16.1 % — ABNORMAL HIGH (ref 11.6–15.4)
WBC: 6.2 x10E3/uL (ref 3.4–10.8)

## 2024-03-23 LAB — LIPID PANEL
Chol/HDL Ratio: 2.8 ratio (ref 0.0–5.0)
Cholesterol, Total: 130 mg/dL (ref 100–199)
HDL: 46 mg/dL (ref >39)
LDL Chol Calc (NIH): 67 mg/dL (ref 0–99)
Triglycerides: 85 mg/dL (ref 0–149)
VLDL Cholesterol Cal: 17 mg/dL (ref 5–40)

## 2024-03-23 LAB — COMPREHENSIVE METABOLIC PANEL WITH GFR
ALT: 9 IU/L (ref 0–44)
AST: 12 IU/L (ref 0–40)
Albumin: 4.3 g/dL (ref 3.9–4.9)
Alkaline Phosphatase: 73 IU/L (ref 44–121)
BUN/Creatinine Ratio: 10 (ref 10–24)
BUN: 14 mg/dL (ref 8–27)
Bilirubin Total: 0.7 mg/dL (ref 0.0–1.2)
CO2: 19 mmol/L — ABNORMAL LOW (ref 20–29)
Calcium: 10.7 mg/dL — ABNORMAL HIGH (ref 8.6–10.2)
Chloride: 106 mmol/L (ref 96–106)
Creatinine, Ser: 1.38 mg/dL — ABNORMAL HIGH (ref 0.76–1.27)
Globulin, Total: 4 g/dL (ref 1.5–4.5)
Glucose: 87 mg/dL (ref 70–99)
Potassium: 5 mmol/L (ref 3.5–5.2)
Sodium: 143 mmol/L (ref 134–144)
Total Protein: 8.3 g/dL (ref 6.0–8.5)
eGFR: 55 mL/min/1.73 — ABNORMAL LOW (ref >59)

## 2024-03-24 NOTE — Addendum Note (Signed)
 Addended by: TAWNI DRILLING D on: 03/24/2024 03:49 PM   Modules accepted: Orders

## 2024-03-24 NOTE — Progress Notes (Signed)
 Remote pacemaker transmission.

## 2024-03-27 ENCOUNTER — Ambulatory Visit (INDEPENDENT_AMBULATORY_CARE_PROVIDER_SITE_OTHER): Admitting: Podiatry

## 2024-03-27 ENCOUNTER — Encounter: Payer: Self-pay | Admitting: Podiatry

## 2024-03-27 DIAGNOSIS — M79675 Pain in left toe(s): Secondary | ICD-10-CM | POA: Diagnosis not present

## 2024-03-27 DIAGNOSIS — B351 Tinea unguium: Secondary | ICD-10-CM | POA: Diagnosis not present

## 2024-03-27 DIAGNOSIS — M79674 Pain in right toe(s): Secondary | ICD-10-CM | POA: Diagnosis not present

## 2024-03-27 DIAGNOSIS — E1140 Type 2 diabetes mellitus with diabetic neuropathy, unspecified: Secondary | ICD-10-CM

## 2024-03-27 NOTE — Progress Notes (Signed)
  Subjective:  Patient ID: Austin Espinoza, male    DOB: 03/02/1956,  MRN: [id number]  Chief Complaint  Patient presents with   [facility]    [facility] with out callous, he does have some dry skin.  A1c is unknown, takes ASA.     68 y.o. male presents with the above complaint. History confirmed with patient. Patient presenting with pain related to dystrophic thickened elongated nails. Patient is unable to trim own nails related to nail dystrophy. Patient does have a history of T2DM, taking metformin.  He denies painful calluses.  He does have history of CHF and A-fib, does report paresthesias and subjective numbness to the toes. Reports being a former long term smoker, reports occasional tobacco use.  Objective:  Physical Exam: warm, cap refill about 3 seconds to the digits, pedal skin shiny and atrophic, decreased pedal hair growth. nail exam onychomycosis of the toenails, onycholysis, dystrophic nails, and with yellow discoloration and subungual debris DP pulses faintly palpable, PT pulses faintly palpable, protective sensation decreased and vibratory sensation diminished Left Foot:  Pain with palpation of nails due to elongation and dystrophic growth.  Right Foot: Pain with palpation of nails due to elongation and dystrophic growth.  Dry, flaky, xerotic pedal skin  Assessment:   1. Type 2 diabetes mellitus with diabetic neuropathy, without long-term current use of insulin  (HCC)   2. Pain due to onychomycosis of toenails of both feet      Plan:  Patient was evaluated and treated and all questions answered.  # Diabetes with neuropathy Patient educated on diabetes. Discussed proper diabetic foot care and discussed risks and complications of disease. Educated patient in depth on reasons to return to the office immediately should he/she discover anything concerning or new on the feet. All questions answered. Discussed proper shoes as well.  - Did recommend use of moisturizing creams and lotions for  dry, flaky, xerotic pedal skin.  #Onychomycosis with pain  -Nails palliatively debrided as below. -Educated on self-care  Procedure: Nail Debridement Rationale: Pain Type of Debridement: manual, sharp debridement. Instrumentation: Nail nipper, rotary burr. Number of Nails: 10  Return in about 3 months (around 06/27/2024) for Diabetic Foot Care.         Ethan Saddler, DPM [facility] / [facility]

## 2024-04-11 ENCOUNTER — Ambulatory Visit

## 2024-05-02 ENCOUNTER — Ambulatory Visit: Payer: Self-pay

## 2024-05-02 ENCOUNTER — Ambulatory Visit

## 2024-05-02 DIAGNOSIS — I5022 Chronic systolic (congestive) heart failure: Secondary | ICD-10-CM

## 2024-05-02 LAB — ECHOCARDIOGRAM COMPLETE
AR max vel: 1.17 cm2
AV Area VTI: 0.99 cm2
AV Area mean vel: 1.21 cm2
AV Mean grad: 2 mmHg
AV Peak grad: 4.5 mmHg
Ao pk vel: 1.07 m/s
Area-P 1/2: 3.65 cm2
MV M vel: 4.32 m/s
MV Peak grad: 74.6 mmHg
MV VTI: 0.55 cm2
MV Vena cont: 0.5 cm
S' Lateral: 4.2 cm

## 2024-05-02 NOTE — Progress Notes (Signed)
 Please inform him or his sister about the results from the echocardiogram study show improved pumping function of the heart, LVEF 40 to 45% [55% and above is considered normal].  Previously in February 2025 this pumping function was further reduced at 25 to 30%.  Overall this is an improvement.  Changes in the wall motion are consistent with your history of obstructive coronary artery disease.  There is increased stiffness of the heart muscle consistent with prior findings given your history of age, heart failure.  There is moderate amount of leakiness associated with mitral valve and mild amount of leakiness associated with tricuspid valve, similar to prior ultrasound.  Will continue to monitor this with repeat echocardiogram.  Thank you

## 2024-05-03 ENCOUNTER — Other Ambulatory Visit: Payer: Self-pay

## 2024-05-03 DIAGNOSIS — I5022 Chronic systolic (congestive) heart failure: Secondary | ICD-10-CM

## 2024-05-22 ENCOUNTER — Ambulatory Visit (INDEPENDENT_AMBULATORY_CARE_PROVIDER_SITE_OTHER)

## 2024-05-22 DIAGNOSIS — I495 Sick sinus syndrome: Secondary | ICD-10-CM | POA: Diagnosis not present

## 2024-05-23 LAB — CUP PACEART REMOTE DEVICE CHECK
Battery Remaining Longevity: 174 mo
Battery Voltage: 3.19 V
Brady Statistic AP VP Percent: 0.01 %
Brady Statistic AP VS Percent: 21.89 %
Brady Statistic AS VP Percent: 0.06 %
Brady Statistic AS VS Percent: 78.03 %
Brady Statistic RA Percent Paced: 22.08 %
Brady Statistic RV Percent Paced: 0.07 %
Date Time Interrogation Session: 20250922030231
Implantable Lead Connection Status: 753985
Implantable Lead Connection Status: 753985
Implantable Lead Implant Date: 20250225
Implantable Lead Implant Date: 20250225
Implantable Lead Location: 753859
Implantable Lead Location: 753860
Implantable Lead Model: 3830
Implantable Lead Model: 5076
Implantable Pulse Generator Implant Date: 20250225
Lead Channel Impedance Value: 304 Ohm
Lead Channel Impedance Value: 342 Ohm
Lead Channel Impedance Value: 475 Ohm
Lead Channel Impedance Value: 494 Ohm
Lead Channel Pacing Threshold Amplitude: 0.625 V
Lead Channel Pacing Threshold Amplitude: 0.625 V
Lead Channel Pacing Threshold Pulse Width: 0.4 ms
Lead Channel Pacing Threshold Pulse Width: 0.4 ms
Lead Channel Sensing Intrinsic Amplitude: 2.25 mV
Lead Channel Sensing Intrinsic Amplitude: 2.25 mV
Lead Channel Sensing Intrinsic Amplitude: 4.125 mV
Lead Channel Sensing Intrinsic Amplitude: 4.125 mV
Lead Channel Setting Pacing Amplitude: 1.5 V
Lead Channel Setting Pacing Amplitude: 2 V
Lead Channel Setting Pacing Pulse Width: 0.4 ms
Lead Channel Setting Sensing Sensitivity: 1.2 mV
Zone Setting Status: 755011

## 2024-05-23 NOTE — Progress Notes (Signed)
 Remote PPM Transmission

## 2024-05-24 ENCOUNTER — Ambulatory Visit: Payer: Self-pay | Admitting: Cardiology

## 2024-06-08 ENCOUNTER — Encounter: Payer: Self-pay | Admitting: Gastroenterology

## 2024-06-26 ENCOUNTER — Encounter: Payer: Self-pay | Admitting: Podiatry

## 2024-06-26 ENCOUNTER — Ambulatory Visit (INDEPENDENT_AMBULATORY_CARE_PROVIDER_SITE_OTHER): Admitting: Podiatry

## 2024-06-26 DIAGNOSIS — M79674 Pain in right toe(s): Secondary | ICD-10-CM | POA: Diagnosis not present

## 2024-06-26 DIAGNOSIS — M79675 Pain in left toe(s): Secondary | ICD-10-CM

## 2024-06-26 DIAGNOSIS — B351 Tinea unguium: Secondary | ICD-10-CM | POA: Diagnosis not present

## 2024-06-26 DIAGNOSIS — E1140 Type 2 diabetes mellitus with diabetic neuropathy, unspecified: Secondary | ICD-10-CM

## 2024-06-26 NOTE — Progress Notes (Signed)
  Subjective:  Patient ID: Austin Espinoza, male    DOB: 1956/02/22,  MRN: [id number]  Chief Complaint  Patient presents with   [facility]     [facility] no callous, dry skin. A1c is unknown, reports it was good ASA      68 y.o. male presents with the above complaint. History confirmed with patient. Patient presenting with pain related to dystrophic thickened elongated nails. Patient is unable to trim own nails related to nail dystrophy. Patient does have a history of T2DM, taking metformin.  He denies painful calluses.  He does have history of CHF and A-fib, does report paresthesias and subjective numbness to the toes. Reports being a former long term smoker, reports occasional tobacco use.  Objective:  Physical Exam: warm, cap refill about 3 seconds to the digits, pedal skin shiny and atrophic, decreased pedal hair growth. nail exam onychomycosis of the toenails, onycholysis, dystrophic nails, and with yellow discoloration and subungual debris DP pulses faintly palpable, PT pulses faintly palpable, protective sensation decreased and vibratory sensation diminished Left Foot:  Pain with palpation of nails due to elongation and dystrophic growth.  Right Foot: Pain with palpation of nails due to elongation and dystrophic growth.  Dry, flaky, xerotic pedal skin  Assessment:   1. Type 2 diabetes mellitus with diabetic neuropathy, without long-term current use of insulin  (HCC)   2. Pain due to onychomycosis of toenails of both feet      Plan:  Patient was evaluated and treated and all questions answered.  # Diabetes with neuropathy Patient educated on diabetes. Discussed proper diabetic foot care and discussed risks and complications of disease. Educated patient in depth on reasons to return to the office immediately should he/she discover anything concerning or new on the feet. All questions answered. Discussed proper shoes as well.  - Continue moisturizing dry xerotic skin of bilateral feet and  lower extremities with over-the-counter moisturizing creams and lotions.  #Onychomycosis with pain  -Nails palliatively debrided as below. -Educated on self-care  Procedure: Nail Debridement Rationale: Pain Type of Debridement: manual, sharp debridement. Instrumentation: Nail nipper, rotary burr. Number of Nails: 10   Return in about 3 months (around 06/27/2024) for Diabetic Foot Care.         Ethan Saddler, DPM [facility] / [facility]

## 2024-07-31 ENCOUNTER — Ambulatory Visit

## 2024-08-15 ENCOUNTER — Other Ambulatory Visit: Payer: Self-pay

## 2024-08-21 ENCOUNTER — Ambulatory Visit

## 2024-08-21 DIAGNOSIS — I495 Sick sinus syndrome: Secondary | ICD-10-CM

## 2024-08-22 LAB — CUP PACEART REMOTE DEVICE CHECK
Battery Remaining Longevity: 171 mo
Battery Voltage: 3.17 V
Brady Statistic AP VP Percent: 0.01 %
Brady Statistic AP VS Percent: 12.11 %
Brady Statistic AS VP Percent: 0.11 %
Brady Statistic AS VS Percent: 87.77 %
Brady Statistic RA Percent Paced: 12.43 %
Brady Statistic RV Percent Paced: 0.12 %
Date Time Interrogation Session: 20251221221117
Implantable Lead Connection Status: 753985
Implantable Lead Connection Status: 753985
Implantable Lead Implant Date: 20250225
Implantable Lead Implant Date: 20250225
Implantable Lead Location: 753859
Implantable Lead Location: 753860
Implantable Lead Model: 3830
Implantable Lead Model: 5076
Implantable Pulse Generator Implant Date: 20250225
Lead Channel Impedance Value: 304 Ohm
Lead Channel Impedance Value: 342 Ohm
Lead Channel Impedance Value: 418 Ohm
Lead Channel Impedance Value: 475 Ohm
Lead Channel Pacing Threshold Amplitude: 0.5 V
Lead Channel Pacing Threshold Amplitude: 0.5 V
Lead Channel Pacing Threshold Pulse Width: 0.4 ms
Lead Channel Pacing Threshold Pulse Width: 0.4 ms
Lead Channel Sensing Intrinsic Amplitude: 2 mV
Lead Channel Sensing Intrinsic Amplitude: 2 mV
Lead Channel Sensing Intrinsic Amplitude: 6.5 mV
Lead Channel Sensing Intrinsic Amplitude: 6.5 mV
Lead Channel Setting Pacing Amplitude: 1.5 V
Lead Channel Setting Pacing Amplitude: 2 V
Lead Channel Setting Pacing Pulse Width: 0.4 ms
Lead Channel Setting Sensing Sensitivity: 1.2 mV
Zone Setting Status: 755011

## 2024-08-23 NOTE — Progress Notes (Signed)
 Remote PPM Transmission

## 2024-08-25 ENCOUNTER — Ambulatory Visit: Payer: Self-pay | Admitting: Cardiology

## 2024-09-25 ENCOUNTER — Ambulatory Visit: Admitting: Podiatry

## 2024-10-05 ENCOUNTER — Encounter: Admitting: Podiatry

## 2024-10-05 NOTE — Progress Notes (Signed)
Patient did not show for scheduled appointment today.

## 2024-11-20 ENCOUNTER — Encounter

## 2025-02-19 ENCOUNTER — Encounter

## 2025-05-21 ENCOUNTER — Encounter

## 2025-08-20 ENCOUNTER — Encounter

## 2025-11-19 ENCOUNTER — Encounter

## 2026-02-18 ENCOUNTER — Encounter
